# Patient Record
Sex: Female | Born: 1975
Health system: Southern US, Community
[De-identification: ages and names within clinical notes are randomized; demographics above are authoritative.]

## PROBLEM LIST (undated history)

## (undated) DIAGNOSIS — Z9889 Other specified postprocedural states: Secondary | ICD-10-CM

## (undated) DIAGNOSIS — C4490 Unspecified malignant neoplasm of skin, unspecified: Secondary | ICD-10-CM

## (undated) DIAGNOSIS — J45909 Unspecified asthma, uncomplicated: Secondary | ICD-10-CM

## (undated) DIAGNOSIS — I10 Essential (primary) hypertension: Secondary | ICD-10-CM

## (undated) DIAGNOSIS — E119 Type 2 diabetes mellitus without complications: Secondary | ICD-10-CM

## (undated) DIAGNOSIS — K219 Gastro-esophageal reflux disease without esophagitis: Secondary | ICD-10-CM

## (undated) HISTORY — PX: BASAL CELL CARCINOMA EXCISION: SHX1214

## (undated) HISTORY — DX: Gastro-esophageal reflux disease without esophagitis: K21.9

## (undated) HISTORY — DX: Other specified postprocedural states: Z98.890

## (undated) HISTORY — PX: CERVICAL DISC SURGERY: SHX588

## (undated) HISTORY — DX: Essential (primary) hypertension: I10

## (undated) HISTORY — DX: Unspecified malignant neoplasm of skin, unspecified: C44.90

---

## 2017-10-05 DIAGNOSIS — N760 Acute vaginitis: Secondary | ICD-10-CM | POA: Diagnosis not present

## 2017-10-05 DIAGNOSIS — N898 Other specified noninflammatory disorders of vagina: Secondary | ICD-10-CM | POA: Diagnosis not present

## 2017-10-05 DIAGNOSIS — B9689 Other specified bacterial agents as the cause of diseases classified elsewhere: Secondary | ICD-10-CM | POA: Diagnosis not present

## 2017-11-06 DIAGNOSIS — J209 Acute bronchitis, unspecified: Secondary | ICD-10-CM | POA: Diagnosis not present

## 2017-11-06 DIAGNOSIS — Z79899 Other long term (current) drug therapy: Secondary | ICD-10-CM | POA: Diagnosis not present

## 2017-11-06 DIAGNOSIS — J069 Acute upper respiratory infection, unspecified: Secondary | ICD-10-CM | POA: Diagnosis not present

## 2017-11-06 DIAGNOSIS — Z880 Allergy status to penicillin: Secondary | ICD-10-CM | POA: Diagnosis not present

## 2017-11-06 DIAGNOSIS — Z9049 Acquired absence of other specified parts of digestive tract: Secondary | ICD-10-CM | POA: Diagnosis not present

## 2017-11-06 DIAGNOSIS — R05 Cough: Secondary | ICD-10-CM | POA: Diagnosis not present

## 2017-11-06 DIAGNOSIS — F17210 Nicotine dependence, cigarettes, uncomplicated: Secondary | ICD-10-CM | POA: Diagnosis not present

## 2017-11-19 DIAGNOSIS — Z880 Allergy status to penicillin: Secondary | ICD-10-CM | POA: Diagnosis not present

## 2017-11-19 DIAGNOSIS — I10 Essential (primary) hypertension: Secondary | ICD-10-CM | POA: Diagnosis not present

## 2017-11-19 DIAGNOSIS — J069 Acute upper respiratory infection, unspecified: Secondary | ICD-10-CM | POA: Diagnosis not present

## 2017-11-19 DIAGNOSIS — F17210 Nicotine dependence, cigarettes, uncomplicated: Secondary | ICD-10-CM | POA: Diagnosis not present

## 2017-11-19 DIAGNOSIS — R079 Chest pain, unspecified: Secondary | ICD-10-CM | POA: Diagnosis not present

## 2017-11-19 DIAGNOSIS — J209 Acute bronchitis, unspecified: Secondary | ICD-10-CM | POA: Diagnosis not present

## 2017-11-20 DIAGNOSIS — R079 Chest pain, unspecified: Secondary | ICD-10-CM | POA: Diagnosis not present

## 2018-08-22 DIAGNOSIS — Z720 Tobacco use: Secondary | ICD-10-CM | POA: Diagnosis not present

## 2018-08-22 DIAGNOSIS — S63681A Other sprain of right thumb, initial encounter: Secondary | ICD-10-CM | POA: Diagnosis not present

## 2018-08-22 DIAGNOSIS — X58XXXA Exposure to other specified factors, initial encounter: Secondary | ICD-10-CM | POA: Diagnosis not present

## 2018-08-22 DIAGNOSIS — S6991XA Unspecified injury of right wrist, hand and finger(s), initial encounter: Secondary | ICD-10-CM | POA: Diagnosis not present

## 2018-08-22 DIAGNOSIS — M4322 Fusion of spine, cervical region: Secondary | ICD-10-CM | POA: Diagnosis not present

## 2018-08-22 DIAGNOSIS — I10 Essential (primary) hypertension: Secondary | ICD-10-CM | POA: Diagnosis not present

## 2018-08-24 DIAGNOSIS — S63501D Unspecified sprain of right wrist, subsequent encounter: Secondary | ICD-10-CM | POA: Diagnosis not present

## 2018-08-24 DIAGNOSIS — Z7689 Persons encountering health services in other specified circumstances: Secondary | ICD-10-CM | POA: Diagnosis not present

## 2018-08-24 DIAGNOSIS — I10 Essential (primary) hypertension: Secondary | ICD-10-CM | POA: Diagnosis not present

## 2018-08-24 DIAGNOSIS — M25531 Pain in right wrist: Secondary | ICD-10-CM | POA: Diagnosis not present

## 2018-08-25 DIAGNOSIS — I10 Essential (primary) hypertension: Secondary | ICD-10-CM | POA: Diagnosis not present

## 2018-09-02 DIAGNOSIS — E870 Hyperosmolality and hypernatremia: Secondary | ICD-10-CM | POA: Diagnosis not present

## 2018-09-02 DIAGNOSIS — R05 Cough: Secondary | ICD-10-CM | POA: Diagnosis not present

## 2018-09-02 DIAGNOSIS — I10 Essential (primary) hypertension: Secondary | ICD-10-CM | POA: Diagnosis not present

## 2018-09-02 DIAGNOSIS — Z0000 Encounter for general adult medical examination without abnormal findings: Secondary | ICD-10-CM | POA: Diagnosis not present

## 2018-09-02 DIAGNOSIS — R7301 Impaired fasting glucose: Secondary | ICD-10-CM | POA: Diagnosis not present

## 2018-11-02 DIAGNOSIS — H1031 Unspecified acute conjunctivitis, right eye: Secondary | ICD-10-CM | POA: Diagnosis not present

## 2018-11-02 DIAGNOSIS — R030 Elevated blood-pressure reading, without diagnosis of hypertension: Secondary | ICD-10-CM | POA: Diagnosis not present

## 2019-02-18 DIAGNOSIS — M7732 Calcaneal spur, left foot: Secondary | ICD-10-CM | POA: Diagnosis not present

## 2019-02-18 DIAGNOSIS — I10 Essential (primary) hypertension: Secondary | ICD-10-CM | POA: Diagnosis not present

## 2019-02-18 DIAGNOSIS — M19072 Primary osteoarthritis, left ankle and foot: Secondary | ICD-10-CM | POA: Diagnosis not present

## 2019-02-18 DIAGNOSIS — M79672 Pain in left foot: Secondary | ICD-10-CM | POA: Diagnosis not present

## 2019-03-03 DIAGNOSIS — M6701 Short Achilles tendon (acquired), right ankle: Secondary | ICD-10-CM | POA: Diagnosis not present

## 2019-03-03 DIAGNOSIS — M722 Plantar fascial fibromatosis: Secondary | ICD-10-CM | POA: Diagnosis not present

## 2019-03-03 DIAGNOSIS — M6702 Short Achilles tendon (acquired), left ankle: Secondary | ICD-10-CM | POA: Diagnosis not present

## 2019-03-03 DIAGNOSIS — M6240 Contracture of muscle, unspecified site: Secondary | ICD-10-CM | POA: Insufficient documentation

## 2019-03-03 DIAGNOSIS — M7522 Bicipital tendinitis, left shoulder: Secondary | ICD-10-CM | POA: Insufficient documentation

## 2019-03-17 DIAGNOSIS — Z7689 Persons encountering health services in other specified circumstances: Secondary | ICD-10-CM | POA: Diagnosis not present

## 2019-03-17 DIAGNOSIS — R7301 Impaired fasting glucose: Secondary | ICD-10-CM | POA: Diagnosis not present

## 2019-03-17 DIAGNOSIS — R51 Headache: Secondary | ICD-10-CM | POA: Diagnosis not present

## 2019-03-17 DIAGNOSIS — Z131 Encounter for screening for diabetes mellitus: Secondary | ICD-10-CM | POA: Diagnosis not present

## 2019-03-17 DIAGNOSIS — Z13220 Encounter for screening for lipoid disorders: Secondary | ICD-10-CM | POA: Diagnosis not present

## 2019-03-17 DIAGNOSIS — I10 Essential (primary) hypertension: Secondary | ICD-10-CM | POA: Diagnosis not present

## 2019-03-24 DIAGNOSIS — E1159 Type 2 diabetes mellitus with other circulatory complications: Secondary | ICD-10-CM | POA: Diagnosis not present

## 2019-03-24 DIAGNOSIS — E785 Hyperlipidemia, unspecified: Secondary | ICD-10-CM | POA: Diagnosis not present

## 2019-03-24 DIAGNOSIS — I10 Essential (primary) hypertension: Secondary | ICD-10-CM | POA: Diagnosis not present

## 2019-03-24 DIAGNOSIS — E1169 Type 2 diabetes mellitus with other specified complication: Secondary | ICD-10-CM | POA: Diagnosis not present

## 2019-04-03 DIAGNOSIS — S0219XB Other fracture of base of skull, initial encounter for open fracture: Secondary | ICD-10-CM | POA: Diagnosis not present

## 2019-04-03 DIAGNOSIS — S0512XA Contusion of eyeball and orbital tissues, left eye, initial encounter: Secondary | ICD-10-CM | POA: Diagnosis not present

## 2019-04-03 DIAGNOSIS — S00432A Contusion of left ear, initial encounter: Secondary | ICD-10-CM | POA: Diagnosis not present

## 2019-04-05 DIAGNOSIS — I10 Essential (primary) hypertension: Secondary | ICD-10-CM | POA: Diagnosis not present

## 2019-04-05 DIAGNOSIS — S0292XD Unspecified fracture of facial bones, subsequent encounter for fracture with routine healing: Secondary | ICD-10-CM | POA: Diagnosis not present

## 2019-04-05 DIAGNOSIS — Z7689 Persons encountering health services in other specified circumstances: Secondary | ICD-10-CM | POA: Diagnosis not present

## 2019-04-05 DIAGNOSIS — E1159 Type 2 diabetes mellitus with other circulatory complications: Secondary | ICD-10-CM | POA: Diagnosis not present

## 2019-04-07 DIAGNOSIS — S0219XA Other fracture of base of skull, initial encounter for closed fracture: Secondary | ICD-10-CM | POA: Diagnosis not present

## 2019-04-07 DIAGNOSIS — S058X2A Other injuries of left eye and orbit, initial encounter: Secondary | ICD-10-CM | POA: Diagnosis not present

## 2019-04-07 DIAGNOSIS — H169 Unspecified keratitis: Secondary | ICD-10-CM | POA: Diagnosis not present

## 2019-04-12 DIAGNOSIS — S0232XA Fracture of orbital floor, left side, initial encounter for closed fracture: Secondary | ICD-10-CM | POA: Diagnosis not present

## 2019-04-12 DIAGNOSIS — S02832D Fracture of medial orbital wall, left side, subsequent encounter for fracture with routine healing: Secondary | ICD-10-CM | POA: Diagnosis not present

## 2019-04-12 DIAGNOSIS — S0219XD Other fracture of base of skull, subsequent encounter for fracture with routine healing: Secondary | ICD-10-CM | POA: Diagnosis not present

## 2019-04-13 DIAGNOSIS — S0280XA Fracture of other specified skull and facial bones, unspecified side, initial encounter for closed fracture: Secondary | ICD-10-CM | POA: Diagnosis not present

## 2019-05-28 DIAGNOSIS — Z20828 Contact with and (suspected) exposure to other viral communicable diseases: Secondary | ICD-10-CM | POA: Diagnosis not present

## 2019-05-28 DIAGNOSIS — R112 Nausea with vomiting, unspecified: Secondary | ICD-10-CM | POA: Diagnosis not present

## 2019-05-28 DIAGNOSIS — R197 Diarrhea, unspecified: Secondary | ICD-10-CM | POA: Diagnosis not present

## 2019-10-06 DIAGNOSIS — E785 Hyperlipidemia, unspecified: Secondary | ICD-10-CM | POA: Diagnosis not present

## 2019-10-06 DIAGNOSIS — E1159 Type 2 diabetes mellitus with other circulatory complications: Secondary | ICD-10-CM | POA: Diagnosis not present

## 2019-10-06 DIAGNOSIS — E1169 Type 2 diabetes mellitus with other specified complication: Secondary | ICD-10-CM | POA: Diagnosis not present

## 2019-10-06 DIAGNOSIS — I152 Hypertension secondary to endocrine disorders: Secondary | ICD-10-CM | POA: Diagnosis not present

## 2019-10-07 DIAGNOSIS — E1169 Type 2 diabetes mellitus with other specified complication: Secondary | ICD-10-CM | POA: Diagnosis not present

## 2019-10-13 DIAGNOSIS — E1159 Type 2 diabetes mellitus with other circulatory complications: Secondary | ICD-10-CM | POA: Diagnosis not present

## 2019-10-13 DIAGNOSIS — I152 Hypertension secondary to endocrine disorders: Secondary | ICD-10-CM | POA: Diagnosis not present

## 2019-10-13 DIAGNOSIS — E785 Hyperlipidemia, unspecified: Secondary | ICD-10-CM | POA: Diagnosis not present

## 2019-10-13 DIAGNOSIS — E1169 Type 2 diabetes mellitus with other specified complication: Secondary | ICD-10-CM | POA: Diagnosis not present

## 2019-10-27 DIAGNOSIS — I152 Hypertension secondary to endocrine disorders: Secondary | ICD-10-CM | POA: Diagnosis not present

## 2019-10-27 DIAGNOSIS — E1159 Type 2 diabetes mellitus with other circulatory complications: Secondary | ICD-10-CM | POA: Diagnosis not present

## 2019-10-27 DIAGNOSIS — F17200 Nicotine dependence, unspecified, uncomplicated: Secondary | ICD-10-CM | POA: Diagnosis not present

## 2019-10-27 DIAGNOSIS — Z6839 Body mass index (BMI) 39.0-39.9, adult: Secondary | ICD-10-CM | POA: Diagnosis not present

## 2019-11-08 DIAGNOSIS — Z1231 Encounter for screening mammogram for malignant neoplasm of breast: Secondary | ICD-10-CM | POA: Diagnosis not present

## 2019-11-10 DIAGNOSIS — F17200 Nicotine dependence, unspecified, uncomplicated: Secondary | ICD-10-CM | POA: Diagnosis not present

## 2019-11-10 DIAGNOSIS — F320 Major depressive disorder, single episode, mild: Secondary | ICD-10-CM | POA: Diagnosis not present

## 2019-11-10 DIAGNOSIS — Z6839 Body mass index (BMI) 39.0-39.9, adult: Secondary | ICD-10-CM | POA: Diagnosis not present

## 2019-11-22 DIAGNOSIS — Z0000 Encounter for general adult medical examination without abnormal findings: Secondary | ICD-10-CM | POA: Diagnosis not present

## 2019-11-22 DIAGNOSIS — E1159 Type 2 diabetes mellitus with other circulatory complications: Secondary | ICD-10-CM | POA: Diagnosis not present

## 2019-11-22 DIAGNOSIS — N760 Acute vaginitis: Secondary | ICD-10-CM | POA: Diagnosis not present

## 2019-11-22 DIAGNOSIS — B9689 Other specified bacterial agents as the cause of diseases classified elsewhere: Secondary | ICD-10-CM | POA: Diagnosis not present

## 2019-11-22 DIAGNOSIS — Z01419 Encounter for gynecological examination (general) (routine) without abnormal findings: Secondary | ICD-10-CM | POA: Diagnosis not present

## 2019-11-22 DIAGNOSIS — I152 Hypertension secondary to endocrine disorders: Secondary | ICD-10-CM | POA: Diagnosis not present

## 2020-01-24 DIAGNOSIS — E1169 Type 2 diabetes mellitus with other specified complication: Secondary | ICD-10-CM | POA: Diagnosis not present

## 2020-06-21 DIAGNOSIS — J45901 Unspecified asthma with (acute) exacerbation: Secondary | ICD-10-CM | POA: Diagnosis not present

## 2020-06-21 DIAGNOSIS — Z20822 Contact with and (suspected) exposure to covid-19: Secondary | ICD-10-CM | POA: Diagnosis not present

## 2020-06-21 DIAGNOSIS — R079 Chest pain, unspecified: Secondary | ICD-10-CM | POA: Diagnosis not present

## 2020-06-21 DIAGNOSIS — Z91041 Radiographic dye allergy status: Secondary | ICD-10-CM | POA: Diagnosis not present

## 2020-06-21 DIAGNOSIS — Z880 Allergy status to penicillin: Secondary | ICD-10-CM | POA: Diagnosis not present

## 2020-06-21 DIAGNOSIS — Z79899 Other long term (current) drug therapy: Secondary | ICD-10-CM | POA: Diagnosis not present

## 2020-06-21 DIAGNOSIS — F17210 Nicotine dependence, cigarettes, uncomplicated: Secondary | ICD-10-CM | POA: Diagnosis not present

## 2020-06-21 DIAGNOSIS — R059 Cough, unspecified: Secondary | ICD-10-CM | POA: Diagnosis not present

## 2020-06-21 DIAGNOSIS — Z888 Allergy status to other drugs, medicaments and biological substances status: Secondary | ICD-10-CM | POA: Diagnosis not present

## 2020-06-21 DIAGNOSIS — I517 Cardiomegaly: Secondary | ICD-10-CM | POA: Diagnosis not present

## 2020-06-21 DIAGNOSIS — R0602 Shortness of breath: Secondary | ICD-10-CM | POA: Diagnosis not present

## 2020-06-21 DIAGNOSIS — I10 Essential (primary) hypertension: Secondary | ICD-10-CM | POA: Diagnosis not present

## 2020-06-21 DIAGNOSIS — E669 Obesity, unspecified: Secondary | ICD-10-CM | POA: Diagnosis not present

## 2020-07-26 ENCOUNTER — Ambulatory Visit: Payer: Self-pay

## 2020-07-26 ENCOUNTER — Telehealth: Payer: Self-pay | Admitting: Emergency Medicine

## 2020-07-26 DIAGNOSIS — K921 Melena: Secondary | ICD-10-CM | POA: Diagnosis not present

## 2020-07-26 DIAGNOSIS — K5730 Diverticulosis of large intestine without perforation or abscess without bleeding: Secondary | ICD-10-CM | POA: Diagnosis not present

## 2020-07-26 DIAGNOSIS — R1084 Generalized abdominal pain: Secondary | ICD-10-CM | POA: Diagnosis not present

## 2020-07-26 DIAGNOSIS — R197 Diarrhea, unspecified: Secondary | ICD-10-CM | POA: Diagnosis not present

## 2020-07-26 NOTE — Telephone Encounter (Signed)
Call to pt regarding symptoms for appointment later on today - pt directed to ER for a higher level of testing and labs - provider updated. Pt verbalized an understanding and confirmed she would go to the ER ASAP

## 2020-08-28 ENCOUNTER — Other Ambulatory Visit: Payer: Self-pay | Admitting: General Surgery

## 2020-08-29 ENCOUNTER — Ambulatory Visit: Payer: Self-pay | Admitting: Gastroenterology

## 2020-09-07 ENCOUNTER — Encounter: Payer: Self-pay | Admitting: Nurse Practitioner

## 2020-09-25 ENCOUNTER — Other Ambulatory Visit: Payer: Self-pay

## 2020-09-27 ENCOUNTER — Telehealth: Payer: Self-pay

## 2020-09-27 ENCOUNTER — Ambulatory Visit: Payer: 59 | Admitting: Nurse Practitioner

## 2020-09-27 ENCOUNTER — Encounter: Payer: Self-pay | Admitting: Nurse Practitioner

## 2020-09-27 ENCOUNTER — Other Ambulatory Visit: Payer: Self-pay

## 2020-09-27 VITALS — BP 176/100 | HR 92 | Ht 69.0 in | Wt 262.6 lb

## 2020-09-27 DIAGNOSIS — K625 Hemorrhage of anus and rectum: Secondary | ICD-10-CM | POA: Diagnosis not present

## 2020-09-27 DIAGNOSIS — I10 Essential (primary) hypertension: Secondary | ICD-10-CM

## 2020-09-27 DIAGNOSIS — K219 Gastro-esophageal reflux disease without esophagitis: Secondary | ICD-10-CM

## 2020-09-27 MED ORDER — PANTOPRAZOLE SODIUM 40 MG PO TBEC: 40.0000 mg | DELAYED_RELEASE_TABLET | Freq: Every day | ORAL | 1 refills | Status: DC

## 2020-09-27 MED ORDER — SUPREP BOWEL PREP KIT 17.5-3.13-1.6 GM/177ML PO SOLN: 1.0000 | ORAL | 0 refills | Status: DC

## 2020-09-27 NOTE — Patient Instructions (Addendum)
If you are age 45 or younger, your body mass index should be between 19-25. Your Body mass index is 38.78 kg/m. If this is out of the aformentioned range listed, please consider follow up with your Primary Care Provider.  PROCEDURES:  You have been scheduled for an endoscopy and colonoscopy. Please follow the written instructions given to you at your visit today. Please pick up your prep supplies at the pharmacy within the next 1-3 days. If you use inhalers (even only as needed), please bring them with you on the day of your procedure.  Schedule appointment with primary care to get your blood pressure medications. Your blood pressure will need to be under control before you can have the procedures.  MEDICATION  We have sent the following medication to your pharmacy for you to pick up at your convenience:  Pantoprazole 40 MG once a day.  It was great seeing you today!  Thank you for entrusting me with your care and choosing [facility].  Noralyn Pick, CRNP   Gastroesophageal Reflux Disease, Adult  Gastroesophageal reflux (GER) happens when acid from the stomach flows up into the tube that connects the mouth and the stomach (esophagus). Normally, food travels down the esophagus and stays in the stomach to be digested. With GER, food and stomach acid sometimes move back up into the esophagus. You may have a disease called gastroesophageal reflux disease (GERD) if the reflux:  Happens often.  Causes frequent or very bad symptoms.  Causes problems such as damage to the esophagus. When this happens, the esophagus becomes sore and swollen. Over time, GERD can make small holes (ulcers) in the lining of the esophagus. What are the causes? This condition is caused by a problem with the muscle between the esophagus and the stomach. When this muscle is weak or not normal, it does not close properly to keep food and acid from coming back up from the stomach. The muscle can be  weak because of:  Tobacco use.  Pregnancy.  Having a certain type of hernia (hiatal hernia).  Alcohol use.  Certain foods and drinks, such as coffee, chocolate, onions, and peppermint. What increases the risk?  Being overweight.  Having a disease that affects your connective tissue.  Taking NSAIDs, such a ibuprofen. What are the signs or symptoms?  Heartburn.  Difficult or painful swallowing.  The feeling of having a lump in the throat.  A bitter taste in the mouth.  Bad breath.  Having a lot of saliva.  Having an upset or bloated stomach.  Burping.  Chest pain. Different conditions can cause chest pain. Make sure you see your doctor if you have chest pain.  Shortness of breath or wheezing.  A long-term cough or a cough at night.  Wearing away of the surface of teeth (tooth enamel).  Weight loss. How is this treated?  Making changes to your diet.  Taking medicine.  Having surgery. Treatment will depend on how bad your symptoms are. Follow these instructions at home: Eating and drinking  Follow a diet as told by your doctor. You may need to avoid foods and drinks such as: ? Coffee and tea, with or without caffeine. ? Drinks that contain alcohol. ? Energy drinks and sports drinks. ? Bubbly (carbonated) drinks or sodas. ? Chocolate and cocoa. ? Peppermint and mint flavorings. ? Garlic and onions. ? Horseradish. ? Spicy and acidic foods. These include peppers, chili powder, curry powder, vinegar, hot sauces, and BBQ sauce. ? Citrus  fruit juices and citrus fruits, such as oranges, lemons, and limes. ? Tomato-based foods. These include red sauce, chili, salsa, and pizza with red sauce. ? Fried and fatty foods. These include donuts, french fries, potato chips, and high-fat dressings. ? High-fat meats. These include hot dogs, rib eye steak, sausage, ham, and bacon. ? High-fat dairy items, such as whole milk, butter, and cream cheese.  Eat small meals  often. Avoid eating large meals.  Avoid drinking large amounts of liquid with your meals.  Avoid eating meals during the 2-3 hours before bedtime.  Avoid lying down right after you eat.  Do not exercise right after you eat.   Lifestyle  Do not smoke or use any products that contain nicotine or tobacco. If you need help quitting, ask your doctor.  Try to lower your stress. If you need help doing this, ask your doctor.  If you are overweight, lose an amount of weight that is healthy for you. Ask your doctor about a safe weight loss goal.   General instructions  Pay attention to any changes in your symptoms.  Take over-the-counter and prescription medicines only as told by your doctor.  Do not take aspirin, ibuprofen, or other NSAIDs unless your doctor says it is okay.  Wear loose clothes. Do not wear anything tight around your waist.  Raise (elevate) the head of your bed about 6 inches (15 cm). You may need to use a wedge to do this.  Avoid bending over if this makes your symptoms worse.  Keep all follow-up visits. Contact a doctor if:  You have new symptoms.  You lose weight and you do not know why.  You have trouble swallowing or it hurts to swallow.  You have wheezing or a cough that keeps happening.  You have a hoarse voice.  Your symptoms do not get better with treatment. Get help right away if:  You have sudden pain in your arms, neck, jaw, teeth, or back.  You suddenly feel sweaty, dizzy, or light-headed.  You have chest pain or shortness of breath.  You vomit and the vomit is green, yellow, or black, or it looks like blood or coffee grounds.  You faint.  Your poop (stool) is red, bloody, or black.  You cannot swallow, drink, or eat. These symptoms may represent a serious problem that is an emergency. Do not wait to see if the symptoms will go away. Get medical help right away. Call your local emergency services (911 in the [place] Do not drive yourself  to the hospital. Summary  If a person has gastroesophageal reflux disease (GERD), food and stomach acid move back up into the esophagus and cause symptoms or problems such as damage to the esophagus.  Treatment will depend on how bad your symptoms are.  Follow a diet as told by your doctor.  Take all medicines only as told by your doctor. This information is not intended to replace advice given to you by your health care provider. Make sure you discuss any questions you have with your health care provider. Document Revised: 02/06/2020 Document Reviewed: 02/06/2020 Elsevier Patient Education  [place].

## 2020-09-27 NOTE — Telephone Encounter (Signed)
-----   Message from Irving Copas., MD sent at 09/27/2020  4:51 PM EST -----   ----- Message ----- From: Noralyn Pick, NP Sent: 09/27/2020   4:47 PM EST To: Irving Copas., MD

## 2020-09-27 NOTE — Telephone Encounter (Signed)
I have reviewed the chart.   I agree with the Advanced Practitioner's note, impression, and recommendations with any updates as below. Reasonable next steps for evaluation include endoscopic evaluation.  Would like to ensure that her blood pressure is stable to improved before her procedure so she does not prep.  When we have her come in for a previsit to have her blood pressure checked once again and ensure that things are better.  If things are not better then she may need to be rescheduled.  Lamari Beckles, can you work on getting her set up for a previsit 1 week before her procedure so that we can make sure she is doing better, has a PCP in place, and her blood pressure is stable.   Justice Britain, MD [facility] Advanced [facility]oscopy Office # [phone]

## 2020-09-27 NOTE — Progress Notes (Signed)
Attending Physician's Attestation   I have reviewed the chart.   I agree with the Advanced Practitioner's note, impression, and recommendations with any updates as below. Reasonable next steps for evaluation include endoscopic evaluation.  Would like to ensure that her blood pressure is stable to improved before her procedure so she does not prep.  When we have her come in for a previsit to have her blood pressure checked once again and ensure that things are better.  If things are not better then she may need to be rescheduled.  Patty, can you work on getting her set up for a previsit 1 week before her procedure so that we can make sure she is doing better, has a PCP in place, and her blood pressure is stable.   Justice Britain, MD [facility] Advanced [facility] Office # [phone]

## 2020-09-27 NOTE — Progress Notes (Addendum)
09/27/2020 Sheri [name] [id number] [birth year]   CHIEF COMPLAINT: Rectal bleeding  HISTORY OF PRESENT ILLNESS:  Sheri [name]. Dicaprio is a 45 year old female with a past medical history asthma, hypertension and basal cell carcinoma.  Past Cervical surgery.  She presents to our office today for further evaluation regarding rectal bleeding.  She reported passing a normal brown bowel movement without straining with a moderate amount of bright red blood which was seen on the toilet tissue and in the toilet water with rectal discomfort which occurred for three consecutive days.  She had right sided mid abdominal/flank pain. She presented to [facility] in [place] on 07/26/2020 for further evaluation.  Labs in the ED showed a WBC 11.5.  Hemoglobin 14.1.  Hematocrit 42.6.  Platelet 260.  BUN 12.  Creatinine 0.78.  Glucose 262.  Total bili 0.5.  Alk phos 86.  AST 22.  ALT 31.  FOBT was negative.  A rectal exam done by the ED physician was normal, no evidence of fissure, hemorrhoids or mass.  An abdominal/pelvic CT scan without contrast was unrevealing.  Her blood pressure was elevated and she did not have a PCP therefore she was prescribed amlodipine 10 mg daily and lisinopril 10 mg daily.  She was prescribed meloxicam 15 mg one p.o. daily for lower abdominal pain, she does not recall if she took this medication or not.  She reported having similar episodes of rectal bleeding which occurred for two or 3 days in October and November 2021.  She denies ever having a colonoscopy.  Paternal grandfather with history of colon cancer.  Mother with history of colon polyps.  She has a history of GERD.  She complains of having heartburn mostly at nighttime which comes and goes for several years.  She purchased a wedge pillow and is now sleeping with the head of the bed elevated since 07/2020 and her reflux symptoms have somewhat improved.  No dysphagia.  No nausea or vomiting.  She underwent an EGD  at [facility] approximately 5 years ago which showed GERD.  He is not taking any acid reducing medications.  She takes Ibuprofen 200 mg two tabs 2-3 nights weekly for leg pain.  Caffeine intake is limited.  No drugs or alcohol use.  She smokes less than 1 pack of cigarettes daily.  CTAP without contrast 07/26/2020: Lower chest: Lung bases are clear. Hepatobiliary: No focal hepatic lesion on noncontrast exam. Normal gallbladder. Pancreas: Pancreas is normal. No ductal dilatation. No pancreatic inflammation. Spleen: Normal spleen Adrenals/urinary tract: Adrenal glands and kidneys are normal. The ureters and bladder normal. Stomach/Bowel: Stomach, duodenum small-bowel normal. No evidence of bowel obstruction or mass. No evidence of bowel inflammation terminal ileum is normal. Appendix normal. The ascending transverse, and descending colon normal. No obstruction or mass. Minimal diverticulosis. No diverticulitis. Rectum normal. Vascular/Lymphatic: Abdominal aorta is normal caliber. No periportal or retroperitoneal adenopathy. No pelvic adenopathy. Reproductive: Uterus and adnexa unremarkable. Other: No free fluid. Musculoskeletal: No aggressive osseous lesion. Impression: 1. No explanation for gastrointestinal bleeding. 2. No small bowel or large bowel inflammation or obstruction. No diverticulitis.  Past Medical History:  Diagnosis Date  . GERD (gastroesophageal reflux disease)   . H/O neck surgery    ED records from 07/26/2020  . Hypertension   . Skin cancer    Past Surgical History:  Procedure Laterality Date  . BASAL CELL CARCINOMA EXCISION     ed records 07/26/2020  . CERVICAL DISC SURGERY  Family History: Mother age 41 with diabetes and HTN. Father age 79 with diabetes. Maternal grandfather colon cancer. Great grandmother had colon cancer. 2 sisters 5 1/2 siblings.   Social History: She is single.  She is a Charity fundraiser at [employer].  She smokes < 7 to 8 cigarettes since  age 32. No alcohol. No drug use.    Allergies  Allergen Reactions  . Iodine Anaphylaxis and Swelling  . Penicillins Hives, Itching and Rash  . Erythromycin Swelling  . Metformin Diarrhea     Outpatient Encounter Medications as of 09/27/2020  Medication Sig  . amLODipine (NORVASC) 10 MG tablet Take by mouth. (Patient not taking: Reported on 09/27/2020)  . hydrochlorothiazide (HYDRODIURIL) 25 MG tablet TAKE ONE-HALF TABLET BY MOUTH ONE TIME DAILY (Patient not taking: Reported on 09/27/2020)  . lisinopril (ZESTRIL) 10 MG tablet Take by mouth. (Patient not taking: Reported on 09/27/2020)  . meloxicam (MOBIC) 15 MG tablet Take by mouth. (Patient not taking: Reported on 09/27/2020)  . [DISCONTINUED] lisinopril (ZESTRIL) 20 MG tablet    No facility-administered encounter medications on file as of 09/27/2020.   REVIEW OF SYSTEMS:  Gen: Denies fever, sweats or chills. No weight loss.  CV: Denies chest pain, palpitations or edema. Resp: Denies cough, shortness of breath of hemoptysis.  GI: See HPI.   GU : Denies urinary burning, blood in urine, increased urinary frequency or incontinence. MS: Bilateral leg pain. Derm: Denies rash, itchiness, skin lesions or unhealing ulcers. Psych: Denies depression, anxiety, memory loss, suicidal ideation and confusion. Heme: Denies bruising, bleeding. Neuro:  Denies headaches, dizziness or paresthesias. Endo:  Denies any problems with DM, thyroid or adrenal function.  PHYSICAL EXAM: BP (!) 176/100   Pulse 92   Ht _0  (1.753 m)   Wt 262 lb 9.6 oz (119.1 kg)   BMI 38.78 kg/m  General: Obese 45 year old female in no acute distress. Head: Normocephalic and atraumatic. Eyes:  Sclerae non-icteric, conjunctive pink. Ears: Normal auditory acuity. Mouth: Dentition intact. No ulcers or lesions.  Neck: Supple, no lymphadenopathy or thyromegaly.  Lungs: Clear bilaterally to auscultation without wheezes, crackles or rhonchi. Heart: Regular rate and rhythm. No  murmur, rub or gallop appreciated.  Abdomen: Soft, nontender, non distended. No masses. No hepatosplenomegaly. Normoactive bowel sounds x 4 quadrants.  Rectal: Patient declined exam.  She wishes to proceed with a colonoscopy for further evaluation. Musculoskeletal: Symmetrical with no gross deformities. Skin: Warm and dry. No rash or lesions on visible extremities. Extremities: No edema. Neurological: Alert oriented x 4, no focal deficits.  Psychological:  Alert and cooperative. Normal mood and affect.  ASSESSMENT AND PLAN:  49.  45 year old female with episode rectal pain and associated hematochezia.  -Colonoscopy benefits and risks discussed including risk with sedation, risk of bleeding, perforation and infection  -Patient to call our office if rectal bleeding recurs  2.  GERD -GERD handout -Pantoprazole 40 mg one p.o. daily -EGD benefits and risks discussed including risk with sedation, risk of bleeding, perforation and infection   3. Hypertension. BP 176/100.  She was previously prescribed Norvasc and Lisinopril by the ED but she has run out of these medications.  -Patient advised to schedule an appointment with new PCP or at the urgent care for hypertension management ASAP, at risk for CVA/MI.  -Patient is informed if her blood pressure is not well controlled by the date of her scheduled EGD and colonoscopy these procedures will be canceled.      ADDENDUM: BP RECHECK 142/86 ON 10/31/2020.  PATIENT TO PROCEED WITH EGD/COLONOSCOPY 11/07/2020 AS SCHEDULED.        CC:  No ref. provider found

## 2020-09-28 NOTE — Telephone Encounter (Signed)
appt for nurse visit on 10/31/20 at 10 am.  The patient has been notified of this information and all questions answered.

## 2020-09-28 NOTE — Telephone Encounter (Signed)
Do you want a nurse visit for a BP check?  Or an actual previsit with the previsit nurse.  I do not believe the previsit nurses do not check BP.

## 2020-09-28 NOTE — Telephone Encounter (Signed)
BP check is fine to ensure ability to move forward. Thanks. GM

## 2020-10-01 ENCOUNTER — Other Ambulatory Visit: Payer: Self-pay

## 2020-10-01 ENCOUNTER — Encounter (INDEPENDENT_AMBULATORY_CARE_PROVIDER_SITE_OTHER): Payer: Self-pay

## 2020-10-01 ENCOUNTER — Ambulatory Visit: Payer: 59 | Admitting: Family

## 2020-10-01 ENCOUNTER — Encounter: Payer: Self-pay | Admitting: Family

## 2020-10-01 VITALS — BP 163/97 | HR 96 | Ht 68.9 in | Wt 260.6 lb

## 2020-10-01 DIAGNOSIS — I10 Essential (primary) hypertension: Secondary | ICD-10-CM | POA: Diagnosis not present

## 2020-10-01 DIAGNOSIS — Z7689 Persons encountering health services in other specified circumstances: Secondary | ICD-10-CM

## 2020-10-01 MED ORDER — LISINOPRIL 30 MG PO TABS: 30.0000 mg | ORAL_TABLET | Freq: Every day | ORAL | 0 refills | Status: DC

## 2020-10-01 MED ORDER — AMLODIPINE BESYLATE 10 MG PO TABS: 10.0000 mg | ORAL_TABLET | Freq: Every day | ORAL | 0 refills | Status: DC

## 2020-10-01 NOTE — Patient Instructions (Addendum)
Return for annual physical examination, labs, and health maintenance. Arrive fasting meaning having had no food and/or nothing to drink for at least 8 hours prior to appointment.  Lisinopril and Amlodipine for high blood pressure. Bring blood pressure monitor to blood pressure check in 2 weeks. Blood pressure goal 130/80 or less.  Thank you for choosing Primary Care at [facility] for your medical home!    Sheri [name] was seen by Camillia Herter, NP today.   Sheri [name]'s primary care provider is Roselynne Lortz Zachery Dauer, NP.   For the best care possible,  you should try to see Durene Fruits, NP whenever you come to clinic.   We look forward to seeing you again soon!  If you have any questions about your visit today,  please call us at [phone]  Or feel free to reach your provider via [place].     Hypertension, Adult Hypertension is another name for high blood pressure. High blood pressure forces your heart to work harder to pump blood. This can cause problems over time. There are two numbers in a blood pressure reading. There is a top number (systolic) over a bottom number (diastolic). It is best to have a blood pressure that is below 120/80. Healthy choices can help lower your blood pressure, or you may need medicine to help lower it. What are the causes? The cause of this condition is not known. Some conditions may be related to high blood pressure. What increases the risk?  Smoking.  Having type 2 diabetes mellitus, high cholesterol, or both.  Not getting enough exercise or physical activity.  Being overweight.  Having too much fat, sugar, calories, or salt (sodium) in your diet.  Drinking too much alcohol.  Having long-term (chronic) kidney disease.  Having a family history of high blood pressure.  Age. Risk increases with age.  Race. You may be at higher risk if you are African American.  Gender. Men are at higher risk than women before age 68. After age 29,  women are at higher risk than men.  Having obstructive sleep apnea.  Stress. What are the signs or symptoms?  High blood pressure may not cause symptoms. Very high blood pressure (hypertensive crisis) may cause: ? Headache. ? Feelings of worry or nervousness (anxiety). ? Shortness of breath. ? Nosebleed. ? A feeling of being sick to your stomach (nausea). ? Throwing up (vomiting). ? Changes in how you see. ? Very bad chest pain. ? Seizures. How is this treated?  This condition is treated by making healthy lifestyle changes, such as: ? Eating healthy foods. ? Exercising more. ? Drinking less alcohol.  Your health care provider may prescribe medicine if lifestyle changes are not enough to get your blood pressure under control, and if: ? Your top number is above 130. ? Your bottom number is above 80.  Your personal target blood pressure may vary. Follow these instructions at home: Eating and drinking  If told, follow the DASH eating plan. To follow this plan: ? Fill one half of your plate at each meal with fruits and vegetables. ? Fill one fourth of your plate at each meal with whole grains. Whole grains include whole-wheat pasta, brown rice, and whole-grain bread. ? Eat or drink low-fat dairy products, such as skim milk or low-fat yogurt. ? Fill one fourth of your plate at each meal with low-fat (lean) proteins. Low-fat proteins include fish, chicken without skin, eggs, beans, and tofu. ? Avoid fatty meat, cured  and processed meat, or chicken with skin. ? Avoid pre-made or processed food.  Eat less than 1,500 mg of salt each day.  Do not drink alcohol if: ? Your doctor tells you not to drink. ? You are pregnant, may be pregnant, or are planning to become pregnant.  If you drink alcohol: ? Limit how much you use to:  0-1 drink a day for women.  0-2 drinks a day for men. ? Be aware of how much alcohol is in your drink. In the [place] one drink equals one 12 oz bottle of  beer (355 mL), one 5 oz glass of wine (148 mL), or one 1 oz glass of hard liquor (44 mL).   Lifestyle  Work with your doctor to stay at a healthy weight or to lose weight. Ask your doctor what the best weight is for you.  Get at least 30 minutes of exercise most days of the week. This may include walking, swimming, or biking.  Get at least 30 minutes of exercise that strengthens your muscles (resistance exercise) at least 3 days a week. This may include lifting weights or doing Pilates.  Do not use any products that contain nicotine or tobacco, such as cigarettes, e-cigarettes, and chewing tobacco. If you need help quitting, ask your doctor.  Check your blood pressure at home as told by your doctor.  Keep all follow-up visits as told by your doctor. This is important.   Medicines  Take over-the-counter and prescription medicines only as told by your doctor. Follow directions carefully.  Do not skip doses of blood pressure medicine. The medicine does not work as well if you skip doses. Skipping doses also puts you at risk for problems.  Ask your doctor about side effects or reactions to medicines that you should watch for. Contact a doctor if you:  Think you are having a reaction to the medicine you are taking.  Have headaches that keep coming back (recurring).  Feel dizzy.  Have swelling in your ankles.  Have trouble with your vision. Get help right away if you:  Get a very bad headache.  Start to feel mixed up (confused).  Feel weak or numb.  Feel faint.  Have very bad pain in your: ? Chest. ? Belly (abdomen).  Throw up more than once.  Have trouble breathing. Summary  Hypertension is another name for high blood pressure.  High blood pressure forces your heart to work harder to pump blood.  For most people, a normal blood pressure is less than 120/80.  Making healthy choices can help lower blood pressure. If your blood pressure does not get lower with  healthy choices, you may need to take medicine. This information is not intended to replace advice given to you by your health care provider. Make sure you discuss any questions you have with your health care provider. Document Revised: 04/07/2018 Document Reviewed: 04/07/2018 Elsevier Patient Education  2021 [employer].

## 2020-10-01 NOTE — Progress Notes (Signed)
Establish care Throbbing headaches Frequent urgent need to urinate for about 6 months Needs BP meds refill-only stated taking lisinopril did not take amlodipine or hydrochlorothiazide

## 2020-10-01 NOTE — Progress Notes (Signed)
Subjective:    Sheri [name] - 45 y.o. female MRN [id number]  Date of birth: 08-11-1976  HPI  Sheri [name] is to establish care. Patient has a PMH significant for hypertension and rectal bleeding.    Current issues and/or concerns: 1. HYPERTENSION: Visit 06/21/2020 at [facility] Emergency Department per NP note: Hydrochlorothiazide added for hypertension management.   Visit 09/27/2020 at Gastroenterology per NP note: Hypertension. BP 176/100.  She was previously prescribed Norvasc and Lisinopril by the ED but she has run out of these medications.  -Patient advised to schedule an appointment with new PCP or at the urgent care for hypertension management ASAP, at risk for CVA/MI.  -Patient is informed if her blood pressure is not well controlled by the date of her scheduled EGD and colonoscopy these procedures will be canceled.    10/01/2020: Reports initially taking Lisinopril 30 mg daily prescribed from previous primary provider and ran out 12 months ago. Subsequently Amlodipine 10 mg was added to the regimen from primary care and ran out about 12 months ago as well. Then had a visit at the Emergency Department in November 2021 at which time Hydrochlorothiazide was added, says she is no longer taking this because she ran outl. Would like to get blood pressure controlled so that she will able to have her colonoscopy soon.   Med Adherence: []  Yes    [x]  No Medication side effects: []  Yes    [x]  No Adherence with salt restriction (low-salt diet): [x]  Yes    []  No Exercise: Yes []  No [x]   Home Monitoring?: [x]  Yes    []  No Monitoring Frequency: [x]  Yes    []  No Home BP results range: []  Yes    [x]  No, has blood pressure monitor at home Smoking [x]  Yes, 6 cigarettes daily, does cough a lot and using inhaler as needed   SOB? []  Yes    [x]  No Chest Pain?: []  Yes    [x]  No Leg swelling?: [x]  Yes, plantar fasciitis Headaches?: [x]  Yes    []  No Dizziness? []  Yes    [x]  No   ROS  per HPI   Health Maintenance:  Health Maintenance Due  Topic Date Due  . Hepatitis C Screening  Never done  . COVID-19 Vaccine (1) Never done  . HIV Screening  Never done  . TETANUS/TDAP  Never done  . PAP SMEAR-Modifier  Never done    Past Medical History: Patient Active Problem List   Diagnosis Date Noted  . Rectal bleeding 09/27/2020  . Hypertension 09/27/2020  . 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 Plantar fasciitis 03/03/2019    Social History   reports that she has been smoking. She has never used smokeless tobacco. She reports previous alcohol use. She reports previous drug use.   Family History  family history includes Colon cancer in her maternal grandfather; Diabetes in her father, mother, and paternal grandmother; Hypertension in her father and mother.   Medications: reviewed and updated   Objective:   Physical Exam BP (!) 163/97 (BP Location: Left Arm)   Pulse 96   Ht 5' 8.9" (1.75 m)   Wt 260 lb 9.6 oz (118.2 kg)   SpO2 97%   BMI 38.60 kg/m  Physical Exam Constitutional:      Appearance: She is obese.  HENT:     Head: Normocephalic.  Eyes:     Extraocular Movements: Extraocular movements intact.     Pupils: Pupils are equal, round, and reactive to  light.  Cardiovascular:     Rate and Rhythm: Normal rate and regular rhythm.     Pulses: Normal pulses.     Heart sounds: Normal heart sounds.  Pulmonary:     Effort: Pulmonary effort is normal.     Breath sounds: Normal breath sounds.  Neurological:     General: No focal deficit present.     Mental Status: She is alert and oriented to person, place, and time.  Psychiatric:        Mood and Affect: Mood normal.        Behavior: Behavior normal.        Assessment & Plan:  1. Encounter to establish care: - Patient presents today to establish care.  - Return for annual physical examination, labs, and health maintenance. Arrive fasting meaning having had no food and/or nothing to drink for at  least 8 hours prior to appointment.  2. Essential hypertension: - Uncontrolled.  - Begin taking Lisinopril and Amlodipine as prescribed.  - Follow-up within 2 weeks for blood pressure check. Write down your blood pressure readings each day and bring those results along with your home blood pressure monitor to your appointment.  - Counseled on blood pressure goal of less than 130/80, low-sodium, DASH diet, medication compliance, 150 minutes of moderate intensity exercise per week as tolerated. Discussed medication compliance, adverse effects. - BMP to check kidney function and electrolyte balance.  - Basic Metabolic Panel - lisinopril (ZESTRIL) 30 MG tablet; Take 1 tablet (30 mg total) by mouth daily.  Dispense: 30 tablet; Refill: 0 - amLODipine (NORVASC) 10 MG tablet; Take 1 tablet (10 mg total) by mouth daily.  Dispense: 30 tablet; Refill: 0   Patient was given clear instructions to go to Emergency Department or return to medical center if symptoms don't improve, worsen, or new problems develop.The patient verbalized understanding.  I discussed the assessment and treatment plan with the patient. The patient was provided an opportunity to ask questions and all were answered. The patient agreed with the plan and demonstrated an understanding of the instructions.   The patient was advised to call back or seek an in-person evaluation if the symptoms worsen or if the condition fails to improve as anticipated.    Durene Fruits, NP 10/01/2020, 8:59 PM Primary Care at [facility]

## 2020-10-02 ENCOUNTER — Other Ambulatory Visit: Payer: Self-pay

## 2020-10-02 DIAGNOSIS — I10 Essential (primary) hypertension: Secondary | ICD-10-CM

## 2020-10-02 LAB — BASIC METABOLIC PANEL
BUN/Creatinine Ratio: 15 (ref 9–23)
BUN: 12 mg/dL (ref 6–24)
CO2: 23 mmol/L (ref 20–29)
Calcium: 9.7 mg/dL (ref 8.7–10.2)
Chloride: 101 mmol/L (ref 96–106)
Creatinine, Ser: 0.78 mg/dL (ref 0.57–1.00)
GFR calc Af Amer: 107 mL/min/{1.73_m2} (ref >59)
GFR calc non Af Amer: 93 mL/min/{1.73_m2} (ref >59)
Glucose: 107 mg/dL — ABNORMAL HIGH (ref 65–99)
Potassium: 4.2 mmol/L (ref 3.5–5.2)
Sodium: 140 mmol/L (ref 134–144)

## 2020-10-02 MED ORDER — AMLODIPINE BESYLATE 10 MG PO TABS: 10.0000 mg | ORAL_TABLET | Freq: Every day | ORAL | 0 refills | Status: DC

## 2020-10-02 MED ORDER — LISINOPRIL 30 MG PO TABS: 30.0000 mg | ORAL_TABLET | Freq: Every day | ORAL | 0 refills | Status: DC

## 2020-10-02 NOTE — Progress Notes (Signed)
Amlodipine and lisinopril reordered for pt

## 2020-10-02 NOTE — Progress Notes (Signed)
Please call patient with update.   Kidney function normal.

## 2020-10-04 ENCOUNTER — Ambulatory Visit: Payer: 59 | Admitting: Medical-Surgical

## 2020-10-05 ENCOUNTER — Ambulatory Visit: Payer: 59 | Admitting: Medical-Surgical

## 2020-10-17 ENCOUNTER — Other Ambulatory Visit: Payer: Self-pay

## 2020-10-17 ENCOUNTER — Ambulatory Visit (INDEPENDENT_AMBULATORY_CARE_PROVIDER_SITE_OTHER): Payer: 59 | Admitting: Family

## 2020-10-17 ENCOUNTER — Encounter: Payer: Self-pay | Admitting: Family

## 2020-10-17 VITALS — BP 139/77 | HR 97 | Ht 68.9 in | Wt 257.0 lb

## 2020-10-17 DIAGNOSIS — R0600 Dyspnea, unspecified: Secondary | ICD-10-CM

## 2020-10-17 DIAGNOSIS — G4700 Insomnia, unspecified: Secondary | ICD-10-CM

## 2020-10-17 DIAGNOSIS — R0609 Other forms of dyspnea: Secondary | ICD-10-CM

## 2020-10-17 DIAGNOSIS — R059 Cough, unspecified: Secondary | ICD-10-CM

## 2020-10-17 DIAGNOSIS — Z1329 Encounter for screening for other suspected endocrine disorder: Secondary | ICD-10-CM | POA: Diagnosis not present

## 2020-10-17 DIAGNOSIS — Z124 Encounter for screening for malignant neoplasm of cervix: Secondary | ICD-10-CM

## 2020-10-17 DIAGNOSIS — Z13220 Encounter for screening for lipoid disorders: Secondary | ICD-10-CM | POA: Diagnosis not present

## 2020-10-17 DIAGNOSIS — Z1159 Encounter for screening for other viral diseases: Secondary | ICD-10-CM | POA: Diagnosis not present

## 2020-10-17 DIAGNOSIS — Z130 Encounter for screening for diseases of the blood and blood-forming organs and certain disorders involving the immune mechanism: Secondary | ICD-10-CM

## 2020-10-17 DIAGNOSIS — Z0000 Encounter for general adult medical examination without abnormal findings: Secondary | ICD-10-CM | POA: Diagnosis not present

## 2020-10-17 DIAGNOSIS — E785 Hyperlipidemia, unspecified: Secondary | ICD-10-CM

## 2020-10-17 DIAGNOSIS — E119 Type 2 diabetes mellitus without complications: Secondary | ICD-10-CM

## 2020-10-17 DIAGNOSIS — I10 Essential (primary) hypertension: Secondary | ICD-10-CM

## 2020-10-17 DIAGNOSIS — Z131 Encounter for screening for diabetes mellitus: Secondary | ICD-10-CM

## 2020-10-17 DIAGNOSIS — Z114 Encounter for screening for human immunodeficiency virus [HIV]: Secondary | ICD-10-CM | POA: Diagnosis not present

## 2020-10-17 DIAGNOSIS — Z13228 Encounter for screening for other metabolic disorders: Secondary | ICD-10-CM

## 2020-10-17 MED ORDER — TRAZODONE HCL 50 MG PO TABS: 50.0000 mg | ORAL_TABLET | Freq: Every day | ORAL | 0 refills | Status: DC

## 2020-10-17 MED ORDER — PREDNISONE 10 MG PO TABS
ORAL_TABLET | ORAL | 0 refills | Status: AC
Start: 2020-10-17 — End: 2020-10-23

## 2020-10-17 MED ORDER — LISINOPRIL 40 MG PO TABS: 40.0000 mg | ORAL_TABLET | Freq: Every day | ORAL | 0 refills | Status: DC

## 2020-10-17 MED ORDER — ALBUTEROL SULFATE HFA 108 (90 BASE) MCG/ACT IN AERS: 2.0000 | INHALATION_SPRAY | Freq: Four times a day (QID) | RESPIRATORY_TRACT | 0 refills | Status: DC | PRN

## 2020-10-17 NOTE — Patient Instructions (Addendum)
Annual physical exam and labs today. Will call with results.    Increase Lisinopril for high blood pressure. Follow-up in 4 weeks.  Trazodone for insomnia. Follow-up in 4 weeks.   Prednisone taper and Albuterol inhaler for cough. Over-the-counter cough medication.   Preventive Care 8-45 Years Old, Female Preventive care refers to lifestyle choices and visits with your health care provider that can promote health and wellness. This includes:  A yearly physical exam. This is also called an annual wellness visit.  Regular dental and eye exams.  Immunizations.  Screening for certain conditions.  Healthy lifestyle choices, such as: ? Eating a healthy diet. ? Getting regular exercise. ? Not using drugs or products that contain nicotine and tobacco. ? Limiting alcohol use. What can I expect for my preventive care visit? Physical exam Your health care provider will check your:  Height and weight. These may be used to calculate your BMI (body mass index). BMI is a measurement that tells if you are at a healthy weight.  Heart rate and blood pressure.  Body temperature.  Skin for abnormal spots. Counseling Your health care provider may ask you questions about your:  Past medical problems.  Family's medical history.  Alcohol, tobacco, and drug use.  Emotional well-being.  Home life and relationship well-being.  Sexual activity.  Diet, exercise, and sleep habits.  Work and work Statistician.  Access to firearms.  Method of birth control.  Menstrual cycle.  Pregnancy history. What immunizations do I need? Vaccines are usually given at various ages, according to a schedule. Your health care provider will recommend vaccines for you based on your age, medical history, and lifestyle or other factors, such as travel or where you work.   What tests do I need? Blood tests  Lipid and cholesterol levels. These may be checked every 5 years, or more often if you are over 75  years old.  Hepatitis C test.  Hepatitis B test. Screening  Lung cancer screening. You may have this screening every year starting at age 46 if you have a 30-pack-year history of smoking and currently smoke or have quit within the past 15 years.  Colorectal cancer screening. ? All adults should have this screening starting at age 29 and continuing until age 54. ? Your health care provider may recommend screening at age 54 if you are at increased risk. ? You will have tests every 1-10 years, depending on your results and the type of screening test.  Diabetes screening. ? This is done by checking your blood sugar (glucose) after you have not eaten for a while (fasting). ? You may have this done every 1-3 years.  Mammogram. ? This may be done every 1-2 years. ? Talk with your health care provider about when you should start having regular mammograms. This may depend on whether you have a family history of breast cancer.  BRCA-related cancer screening. This may be done if you have a family history of breast, ovarian, tubal, or peritoneal cancers.  Pelvic exam and Pap test. ? This may be done every 3 years starting at age 45. ? Starting at age 8, this may be done every 5 years if you have a Pap test in combination with an HPV test. Other tests  STD (sexually transmitted disease) testing, if you are at risk.  Bone density scan. This is done to screen for osteoporosis. You may have this scan if you are at high risk for osteoporosis. Talk with your health care provider  about your test results, treatment options, and if necessary, the need for more tests. Follow these instructions at home: Eating and drinking  Eat a diet that includes fresh fruits and vegetables, whole grains, lean protein, and low-fat dairy products.  Take vitamin and mineral supplements as recommended by your health care provider.  Do not drink alcohol if: ? Your health care provider tells you not to drink. ? You  are pregnant, may be pregnant, or are planning to become pregnant.  If you drink alcohol: ? Limit how much you have to 0-1 drink a day. ? Be aware of how much alcohol is in your drink. In the [place] one drink equals one 12 oz bottle of beer (355 mL), one 5 oz glass of wine (148 mL), or one 1 oz glass of hard liquor (44 mL).   Lifestyle  Take daily care of your teeth and gums. Brush your teeth every morning and night with fluoride toothpaste. Floss one time each day.  Stay active. Exercise for at least 30 minutes 5 or more days each week.  Do not use any products that contain nicotine or tobacco, such as cigarettes, e-cigarettes, and chewing tobacco. If you need help quitting, ask your health care provider.  Do not use drugs.  If you are sexually active, practice safe sex. Use a condom or other form of protection to prevent STIs (sexually transmitted infections).  If you do not wish to become pregnant, use a form of birth control. If you plan to become pregnant, see your health care provider for a prepregnancy visit.  If told by your health care provider, take low-dose aspirin daily starting at age 50.  Find healthy ways to cope with stress, such as: ? Meditation, yoga, or listening to music. ? Journaling. ? Talking to a trusted person. ? Spending time with friends and family. Safety  Always wear your seat belt while driving or riding in a vehicle.  Do not drive: ? If you have been drinking alcohol. Do not ride with someone who has been drinking. ? When you are tired or distracted. ? While texting.  Wear a helmet and other protective equipment during sports activities.  If you have firearms in your house, make sure you follow all gun safety procedures. What's next?  Visit your health care provider once a year for an annual wellness visit.  Ask your health care provider how often you should have your eyes and teeth checked.  Stay up to date on all vaccines. This information  is not intended to replace advice given to you by your health care provider. Make sure you discuss any questions you have with your health care provider. Document Revised: 05/01/2020 Document Reviewed: 04/08/2018 Elsevier Patient Education  2021 Elsevier Inc.  

## 2020-10-17 NOTE — Progress Notes (Signed)
Physical  Walking short distances experiences chest tightness Dry cough  Previous provider prescribed prednisone for cough and heavy chest feeling  Wants to be referred to Pulmonology to check for COPD

## 2020-10-17 NOTE — Progress Notes (Signed)
Patient ID: Sheri [name], female    DOB: [birth year]  MRN: [id number]  CC: Annual Physical Exam  Subjective: Sheri [name] is a 45 y.o. female who presents for annual physical exam.  Her concerns today include:   1. HYPERTENSION FOLLOW-UP: 10/01/2020: - Uncontrolled.  - Begin taking Lisinopril and Amlodipine as prescribed.  - Follow-up within 2 weeks for blood pressure check.  10/17/2020: Currently taking: see medication list  Med Adherence: [x]  Yes    []  No Medication side effects: []  Yes    [x]  No Home Monitoring?: [x]  Yes, 140's/90's Smoking [x]  Yes []  No SOB? [x]  Yes    []  No  2. SHORTNESS OF BREATH: Ongoing for several months. Shortness of breath with activity and at rest. Alternating between two inhalers. One inhaler being ProAir and not quite sure of the name of the second inhaler which she was able to get from a friend. Using either inhaler about 2 to 3 times daily. Taking Mucinex. Denies chest pain but does have chest tightness. Smoking about 5 to 6 cigarettes most days for at least 20 years or greater, she would like to quit. The weather tends to make it worse. Overall not feeling well. Would like referral to Pulmonology for consideration of possible COPD.  3. 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 Having insomnia once returning home from work for about 3 months. Has tried over-the-counter Melatonin without relief. Would like to try insomnia medication.   Patient Active Problem List   Diagnosis Date Noted  . Type 2 diabetes mellitus ([place]) 10/18/2020  . Hyperlipidemia 10/18/2020  . Rectal bleeding 09/27/2020  . Hypertension 09/27/2020  . 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 Plantar fasciitis 03/03/2019     Current Outpatient Medications on File Prior to Visit  Medication Sig Dispense Refill  . amLODipine (NORVASC) 10 MG tablet Take 1 tablet (10 mg total) by mouth daily. 30 tablet 0  . pantoprazole (PROTONIX) 40 MG tablet Take 1 tablet (40 mg total) by  mouth daily. 30 tablet 1   No current facility-administered medications on file prior to visit.    Allergies  Allergen Reactions  . Amoxicillin Hives and Rash  . Iodine Anaphylaxis and Swelling  . Penicillins Hives, Itching and Rash  . Erythromycin Swelling and Other (See Comments)  . Metformin Diarrhea    Social History   Socioeconomic History  . Marital status: Single    Spouse name: Not on file  . Number of children: Not on file  . Years of education: Not on file  . Highest education level: Not on file  Occupational History  . Not on file  Tobacco Use  . Smoking status: Current Some Day Smoker    Packs/day: 0.00    Types: Cigarettes  . Smokeless tobacco: Never Used  Vaping Use  . Vaping Use: Not on file  Substance and Sexual Activity  . Alcohol use: Not Currently  . Drug use: Not Currently  . Sexual activity: Never    Birth control/protection: None  Other Topics Concern  . Not on file  Social History Narrative   ** Merged History Encounter **       Social Determinants of Health   Financial Resource Strain: Not on file  Food Insecurity: Not on file  Transportation Needs: Not on file  Physical Activity: Not on file  Stress: Not on file  Social Connections: Not on file  Intimate Partner Violence: Not on file    Family History  Problem Relation Age of  Onset  . Hypertension Mother   . Diabetes Mother   . Diabetes Father   . Hypertension Father   . Colon cancer Maternal Grandfather   . Diabetes Paternal Grandmother     Past Surgical History:  Procedure Laterality Date  . BASAL CELL CARCINOMA EXCISION     ed records 07/26/2020  . CERVICAL DISC SURGERY      ROS: Review of Systems Negative except as stated above  PHYSICAL EXAM: BP 139/77 (BP Location: Left Arm, Patient Position: Sitting)   Pulse 97   Ht 5' 8.9" (1.75 m)   Wt 257 lb (116.6 kg)   SpO2 97%   BMI 38.06 kg/m    Wt Readings from Last 3 Encounters:  10/17/20 257 lb (116.6 kg)   10/01/20 260 lb 9.6 oz (118.2 kg)  09/27/20 262 lb 9.6 oz (119.1 kg)     Physical Exam Exam conducted with a chaperone present.  Constitutional:      Appearance: She is obese.  HENT:     Head: Normocephalic and atraumatic.     Right Ear: Tympanic membrane, ear canal and external ear normal.     Left Ear: Tympanic membrane, ear canal and external ear normal.     Nose: Nose normal.     Mouth/Throat:     Mouth: Mucous membranes are moist.     Pharynx: Oropharynx is clear.  Eyes:     Extraocular Movements: Extraocular movements intact.     Conjunctiva/sclera: Conjunctivae normal.     Pupils: Pupils are equal, round, and reactive to light.  Cardiovascular:     Rate and Rhythm: Normal rate and regular rhythm.     Pulses: Normal pulses.     Heart sounds: Normal heart sounds.  Pulmonary:     Effort: Pulmonary effort is normal.     Breath sounds: Normal breath sounds.  Chest:  Breasts:     Right: Normal.     Left: Normal.      Comments: Elmon Else, CMA present during examination.  Abdominal:     General: Bowel sounds are normal.     Palpations: Abdomen is soft.  Genitourinary:    Comments: Patient declined examination. Musculoskeletal:        General: Normal range of motion.     Cervical back: Normal range of motion and neck supple.  Skin:    General: Skin is warm and dry.     Capillary Refill: Capillary refill takes less than 2 seconds.  Neurological:     General: No focal deficit present.     Mental Status: She is alert and oriented to person, place, and time.  Psychiatric:        Mood and Affect: Mood normal.        Behavior: Behavior normal.     ASSESSMENT AND PLAN: 1. Annual physical exam: - Counseled on 150 minutes of exercise per week as tolerated, healthy eating (including decreased daily intake of saturated fats, cholesterol, added sugars, sodium), STI prevention, and routine healthcare maintenance.  2. Screening for metabolic disorder: - BMP last  obtained 10/01/2020. - Hepatic function panel to check liver function.  - Hepatic function panel  3. Screening for deficiency anemia: - CBC to screen for anemia. - CBC  4. Diabetes mellitus screening: - Hemoglobin A1c to screen for pre-diabetes/diabetes. - Hemoglobin A1c  5. Screening cholesterol level: - Lipid panel to screen for high cholesterol.  - Lipid panel  6. Thyroid disorder screen: - TSH to check thyroid function.  -  TSH+T4F+T3Free  7. Need for hepatitis C screening test: - HCV antibody to screen for hepatitis C.  - HCV Ab w/Rflx to Verification  8. Encounter for screening for HIV: - HIV antibody to screen for human immunodeficiency virus.  - HIV antibody (with reflex)  9. Cervical cancer screening: - Patient reports she has appointment scheduled soon for PAP smear.   10. Essential hypertension: - Blood pressure improved since last visit and closer to goal.  - Home blood pressure tends to average 140's/90's. - Increase Lisinopril from 30 mg daily to 40 mg daily.  -Follow-up within 4 weeks for blood pressure check. Write down your blood pressure readings each day and bring those results along with your home blood pressure monitor to your appointment. - Counseled on blood pressure goal of less than 130/80, low-sodium, DASH diet, medication compliance, 150 minutes of moderate intensity exercise per week as tolerated. Discussed medication compliance, adverse effects. - lisinopril (ZESTRIL) 40 MG tablet; Take 1 tablet (40 mg total) by mouth daily.  Dispense: 30 tablet; Refill: 0  11. Cough: 12. Dyspnea on exertion: - Ongoing for several months. Shortness of breath with activity and at rest. Alternating between two inhalers. One inhaler being ProAir and not quite sure of the name of the second inhaler which she was able to get from a friend. Using either inhaler about 2 to 3 times daily. Taking Mucinex. Denies chest pain but does have chest tightness. Smoking about 5 to 6  cigarettes most days for at least 20 years or greater, she would like to quit. The weather tends to make it worse. Overall not feeling well. Would like referral to Pulmonology for consideration of possible COPD. - Patient stable during today's visit and without cardiac or respiratory distress.  - Prednisone and Albuterol inhaler as prescribed.  - Per patient request referral to Pulmonology for further evaluation and management.  - Ambulatory referral to Pulmonology - predniSONE (DELTASONE) 10 MG tablet; Take 6 tablets (60 mg total) by mouth daily with breakfast for 1 day, THEN 5 tablets (50 mg total) daily with breakfast for 1 day, THEN 4 tablets (40 mg total) daily with breakfast for 1 day, THEN 3 tablets (30 mg total) daily with breakfast for 1 day, THEN 2 tablets (20 mg total) daily with breakfast for 1 day, THEN 1 tablet (10 mg total) daily with breakfast for 1 day.  Dispense: 21 tablet; Refill: 0 - albuterol (VENTOLIN HFA) 108 (90 Base) MCG/ACT inhaler; Inhale 2 puffs into the lungs every 6 (six) hours as needed for wheezing or shortness of breath.  Dispense: 8 g; Refill: 0  13. Insomnia, unspecified type: - Will try course of Trazodone for insomnia. May cause drowsiness. Counseled patient to not consume if operating heavy machinery or driving. Counseled patient to not consume with alcohol or illicit substances. Patient verbalized understanding.  - Follow-up with primary provider within 4 weeks or sooner if needed.  - traZODone (DESYREL) 50 MG tablet; Take 1 tablet (50 mg total) by mouth at bedtime.  Dispense: 30 tablet; Refill: 0   Patient was given the opportunity to ask questions.  Patient verbalized understanding of the plan and was able to repeat key elements of the plan. Patient was given clear instructions to go to Emergency Department or return to medical center if symptoms don't improve, worsen, or new problems develop.The patient verbalized understanding.   Orders Placed This  Encounter  Procedures  . Hepatic function panel  . CBC  . Lipid panel  .  TSH+T4F+T3Free  . Hemoglobin A1c  . HCV Ab w/Rflx to Verification  . HIV antibody (with reflex)  . Interpretation:  . CBC  . Ambulatory referral to Pulmonology     Requested Prescriptions   Signed Prescriptions Disp Refills  . lisinopril (ZESTRIL) 40 MG tablet 30 tablet 0    Sig: Take 1 tablet (40 mg total) by mouth daily.  . predniSONE (DELTASONE) 10 MG tablet 21 tablet 0    Sig: Take 6 tablets (60 mg total) by mouth daily with breakfast for 1 day, THEN 5 tablets (50 mg total) daily with breakfast for 1 day, THEN 4 tablets (40 mg total) daily with breakfast for 1 day, THEN 3 tablets (30 mg total) daily with breakfast for 1 day, THEN 2 tablets (20 mg total) daily with breakfast for 1 day, THEN 1 tablet (10 mg total) daily with breakfast for 1 day.  . albuterol (VENTOLIN HFA) 108 (90 Base) MCG/ACT inhaler 8 g 0    Sig: Inhale 2 puffs into the lungs every 6 (six) hours as needed for wheezing or shortness of breath.  . traZODone (DESYREL) 50 MG tablet 30 tablet 0    Sig: Take 1 tablet (50 mg total) by mouth at bedtime.  . Dulaglutide (TRULICITY) 5.70 VX/7.9TJ SOPN 3 mL 0    Sig: Inject 0.75 mg into the skin once a week.  . Insulin Pen Needle (BD PEN NEEDLE MICRO U/F) 32G X 6 MM MISC 100 each 1    Sig: One injection weekly.  . Blood Glucose Monitoring Suppl (TRUE METRIX METER) w/Device KIT 1 kit 0    Sig: Use as directed  . glucose blood (TRUE METRIX BLOOD GLUCOSE TEST) test strip 100 each 12    Sig: Use as instructed  . TRUEplus Lancets 28G MISC 100 each 4    Sig: Use as directed  . atorvastatin (LIPITOR) 20 MG tablet 90 tablet 0    Sig: Take 1 tablet (20 mg total) by mouth daily.  . Alcohol Swabs (ALCOHOL PREP) 70 % PADS 100 each 1    Sig: One injection weekly.    Return in about 4 weeks (around 11/14/2020) for Follow-Up hypertension and insomnia .  Camillia Herter, NP

## 2020-10-18 ENCOUNTER — Encounter: Payer: Self-pay | Admitting: Family

## 2020-10-18 DIAGNOSIS — E785 Hyperlipidemia, unspecified: Secondary | ICD-10-CM | POA: Insufficient documentation

## 2020-10-18 DIAGNOSIS — E119 Type 2 diabetes mellitus without complications: Secondary | ICD-10-CM | POA: Insufficient documentation

## 2020-10-18 LAB — CBC
Hematocrit: 43.8 % (ref 34.0–46.6)
Hemoglobin: 14.7 g/dL (ref 11.1–15.9)
MCH: 28.9 pg (ref 26.6–33.0)
MCHC: 33.6 g/dL (ref 31.5–35.7)
MCV: 86 fL (ref 79–97)
Platelets: 304 10*3/uL (ref 150–450)
RBC: 5.08 x10E6/uL (ref 3.77–5.28)
RDW: 13.1 % (ref 11.7–15.4)
WBC: 11.8 10*3/uL — ABNORMAL HIGH (ref 3.4–10.8)

## 2020-10-18 LAB — LIPID PANEL
Chol/HDL Ratio: 5.1 ratio — ABNORMAL HIGH (ref 0.0–4.4)
Cholesterol, Total: 276 mg/dL — ABNORMAL HIGH (ref 100–199)
HDL: 54 mg/dL (ref >39)
LDL Chol Calc (NIH): 184 mg/dL — ABNORMAL HIGH (ref 0–99)
Triglycerides: 201 mg/dL — ABNORMAL HIGH (ref 0–149)
VLDL Cholesterol Cal: 38 mg/dL (ref 5–40)

## 2020-10-18 LAB — HEPATIC FUNCTION PANEL
ALT: 30 IU/L (ref 0–32)
AST: 28 IU/L (ref 0–40)
Albumin: 4.9 g/dL — ABNORMAL HIGH (ref 3.8–4.8)
Alkaline Phosphatase: 105 IU/L (ref 44–121)
Bilirubin Total: 0.5 mg/dL (ref 0.0–1.2)
Bilirubin, Direct: 0.1 mg/dL (ref 0.00–0.40)
Total Protein: 7.7 g/dL (ref 6.0–8.5)

## 2020-10-18 LAB — HIV ANTIBODY (ROUTINE TESTING W REFLEX): HIV Screen 4th Generation wRfx: NONREACTIVE

## 2020-10-18 LAB — HEMOGLOBIN A1C
Est. average glucose Bld gHb Est-mCnc: 214 mg/dL
Hgb A1c MFr Bld: 9.1 % — ABNORMAL HIGH (ref 4.8–5.6)

## 2020-10-18 LAB — TSH+T4F+T3FREE
Free T4: 1.02 ng/dL (ref 0.82–1.77)
T3, Free: 3.1 pg/mL (ref 2.0–4.4)
TSH: 0.56 u[IU]/mL (ref 0.450–4.500)

## 2020-10-18 LAB — HCV INTERPRETATION

## 2020-10-18 LAB — HCV AB W/RFLX TO VERIFICATION: HCV Ab: <0.1 s/co ratio (ref 0.0–0.9)

## 2020-10-18 MED ORDER — TRUE METRIX BLOOD GLUCOSE TEST VI STRP: ORAL_STRIP | 12 refills | Status: DC

## 2020-10-18 MED ORDER — TRUEPLUS LANCETS 28G MISC: 4 refills | Status: DC

## 2020-10-18 MED ORDER — BD PEN NEEDLE MICRO U/F 32G X 6 MM MISC
1 refills | Status: DC
Start: 2020-10-18 — End: 2021-07-23

## 2020-10-18 MED ORDER — TRULICITY 0.75 MG/0.5ML ~~LOC~~ SOAJ: 0.7500 mg | SUBCUTANEOUS | 0 refills | Status: DC

## 2020-10-18 MED ORDER — ALCOHOL PREP 70 % PADS: MEDICATED_PAD | 1 refills | Status: AC

## 2020-10-18 MED ORDER — TRUE METRIX METER W/DEVICE KIT: PACK | 0 refills | Status: AC

## 2020-10-18 MED ORDER — ATORVASTATIN CALCIUM 20 MG PO TABS: 20.0000 mg | ORAL_TABLET | Freq: Every day | ORAL | 0 refills | Status: DC

## 2020-10-18 NOTE — Progress Notes (Signed)
Liver function normal.   Thyroid normal.   WBC's, sometimes called infection fighters, mildly elevated. We will recheck in 6 weeks. Please schedule patient lab appointment for this.   Hemoglobin A1c is consistent with diabetes. Practice healthy eating habits of fresh fruit and vegetables, lean baked meats such as chicken, fish, and [place]; limit breads, rice, pastas, and desserts; practice regular aerobic exercise (at least 150 minutes a week as tolerated).   Trulicity prescribed for diabetes management. Follow-up in 4 weeks or sooner if needed for diabetes checkup. Please schedule patient appointment for this.  A glucose meter, lancets, and test strips also prescribed for diabetes. Please record blood sugars with the date and bring to appointment in 4 weeks.   A fasting blood sugar of 80 to 130 mg/dL and a postprandial glucose (90 to 120 minutes after a meal) less than 180 mg/dL is the target goal.    Cholesterol higher than expected. High cholesterol may increase risk of heart attack and/or stroke. Consider eating more fruits, vegetables, and lean baked meats such as chicken or fish. Moderate intensity exercise at least 150 minutes as tolerated per week may help as well.    Atorvastatin prescribed for high cholesterol. Patient encouraged to schedule appointment to have this rechecked in 3 months.

## 2020-10-22 ENCOUNTER — Encounter: Payer: Self-pay | Admitting: Family

## 2020-10-28 ENCOUNTER — Ambulatory Visit
Admission: RE | Admit: 2020-10-28 | Discharge: 2020-10-28 | Discharge status: Against Medical Advice | Payer: 59 | Source: Ambulatory Visit | Attending: Family | Admitting: Family

## 2020-10-28 ENCOUNTER — Other Ambulatory Visit: Payer: Self-pay

## 2020-10-28 DIAGNOSIS — J189 Pneumonia, unspecified organism: Secondary | ICD-10-CM | POA: Diagnosis not present

## 2020-10-28 DIAGNOSIS — R059 Cough, unspecified: Secondary | ICD-10-CM | POA: Diagnosis not present

## 2020-10-28 DIAGNOSIS — Z20822 Contact with and (suspected) exposure to covid-19: Secondary | ICD-10-CM | POA: Diagnosis not present

## 2020-10-28 NOTE — ED Notes (Signed)
Per PCA: accidentally registered in error; pt not present

## 2020-10-29 ENCOUNTER — Telehealth: Payer: Self-pay | Admitting: Family

## 2020-10-29 NOTE — Telephone Encounter (Signed)
Received via fax [employer]ML[employer] Forms for provider to complete. Placed in PCP's bin at front office.

## 2020-10-30 NOTE — Telephone Encounter (Signed)
Called pt. She said she was out of work Tue 3/15 & 3/16 sick. Employer wants forms filled out for those dates. Pt states she now has pneumonia. Pt states her normal work schedule is Mon, [place], Wed each week. Call pt with further questions [phone].

## 2020-10-31 ENCOUNTER — Ambulatory Visit: Payer: 59 | Admitting: Gastroenterology

## 2020-10-31 VITALS — BP 142/86 | HR 80

## 2020-10-31 DIAGNOSIS — K625 Hemorrhage of anus and rectum: Secondary | ICD-10-CM

## 2020-11-01 NOTE — Progress Notes (Signed)
Noted pt has been advised to keep procedures as planned

## 2020-11-01 NOTE — Progress Notes (Signed)
Pt arrived for BP and HR check.  Results sent to Dr Rush Landmark

## 2020-11-06 ENCOUNTER — Telehealth: Payer: Self-pay | Admitting: Family

## 2020-11-06 ENCOUNTER — Other Ambulatory Visit (HOSPITAL_COMMUNITY): Payer: Self-pay | Admitting: Family

## 2020-11-06 ENCOUNTER — Other Ambulatory Visit: Payer: Self-pay

## 2020-11-06 DIAGNOSIS — E119 Type 2 diabetes mellitus without complications: Secondary | ICD-10-CM

## 2020-11-06 DIAGNOSIS — I10 Essential (primary) hypertension: Secondary | ICD-10-CM

## 2020-11-06 MED ORDER — AMLODIPINE BESYLATE 10 MG PO TABS: 10.0000 mg | ORAL_TABLET | Freq: Every day | ORAL | 2 refills | Status: DC

## 2020-11-06 MED ORDER — TRULICITY 0.75 MG/0.5ML ~~LOC~~ SOAJ: 0.7500 mg | SUBCUTANEOUS | 0 refills | Status: DC

## 2020-11-06 NOTE — Telephone Encounter (Signed)
Pt called Stating that she needs a medicaction Refill on;  amLODipine (NORVASC) 10 MG tablet  Dulaglutide (TRULICITY) 8.38 FM/4.0RF SOPN  Pt would like medication sent to;  [id number] [place] [place] - [address]

## 2020-11-06 NOTE — Progress Notes (Signed)
Amlodipine/Trulicity refilled

## 2020-11-07 ENCOUNTER — Ambulatory Visit (AMBULATORY_SURGERY_CENTER): Payer: 59 | Admitting: Gastroenterology

## 2020-11-07 ENCOUNTER — Encounter: Payer: Self-pay | Admitting: Gastroenterology

## 2020-11-07 ENCOUNTER — Other Ambulatory Visit: Payer: Self-pay

## 2020-11-07 VITALS — BP 105/72 | HR 87 | Temp 96.9°F | Resp 18 | Ht 69.0 in | Wt 262.0 lb

## 2020-11-07 DIAGNOSIS — K625 Hemorrhage of anus and rectum: Secondary | ICD-10-CM

## 2020-11-07 DIAGNOSIS — K5730 Diverticulosis of large intestine without perforation or abscess without bleeding: Secondary | ICD-10-CM | POA: Diagnosis not present

## 2020-11-07 DIAGNOSIS — D128 Benign neoplasm of rectum: Secondary | ICD-10-CM

## 2020-11-07 DIAGNOSIS — D129 Benign neoplasm of anus and anal canal: Secondary | ICD-10-CM

## 2020-11-07 DIAGNOSIS — K219 Gastro-esophageal reflux disease without esophagitis: Secondary | ICD-10-CM | POA: Diagnosis not present

## 2020-11-07 DIAGNOSIS — K621 Rectal polyp: Secondary | ICD-10-CM

## 2020-11-07 DIAGNOSIS — K641 Second degree hemorrhoids: Secondary | ICD-10-CM | POA: Diagnosis not present

## 2020-11-07 DIAGNOSIS — K449 Diaphragmatic hernia without obstruction or gangrene: Secondary | ICD-10-CM

## 2020-11-07 DIAGNOSIS — K2289 Other specified disease of esophagus: Secondary | ICD-10-CM

## 2020-11-07 MED ORDER — SODIUM CHLORIDE 0.9 % IV SOLN: 500.0000 mL | Freq: Once | INTRAVENOUS | Status: DC

## 2020-11-07 NOTE — Progress Notes (Signed)
pt tolerated well. VSS. awake and to recovery. Report given to RN. Bite block insitu to recovey.

## 2020-11-07 NOTE — Op Note (Signed)
[facility] Patient Name: Sheri [name] Procedure Date: 11/07/2020 1:42 PM MRN: [id number] Endoscopist: Justice Britain , MD Age: 45 Referring MD:  Date of Birth: July 19, 1976 Gender: Female Account #: [id number] Procedure:                Upper GI endoscopy Indications:              Heartburn, Gastro-esophageal reflux disease Medicines:                Monitored Anesthesia Care Procedure:                Pre-Anesthesia Assessment:                           - Prior to the procedure, a History and Physical                            was performed, and patient medications and                            allergies were reviewed. The patient's tolerance of                            previous anesthesia was also reviewed. The risks                            and benefits of the procedure and the sedation                            options and risks were discussed with the patient.                            All questions were answered, and informed consent                            was obtained. Prior Anticoagulants: The patient has                            taken no previous anticoagulant or antiplatelet                            agents. ASA Grade Assessment: III - A patient with                            severe systemic disease. After reviewing the risks                            and benefits, the patient was deemed in                            satisfactory condition to undergo the procedure.                           After obtaining informed consent, the endoscope was  passed under direct vision. Throughout the                            procedure, the patient's blood pressure, pulse, and                            oxygen saturations were monitored continuously. The                            Endoscope was introduced through the mouth, and                            advanced to the second part of duodenum. The upper                            GI  endoscopy was accomplished without difficulty.                            The patient tolerated the procedure. Scope In: Scope Out: Findings:                 No gross lesions were noted in the entire                            esophagus. Biopsies were taken with a cold forceps                            for histology to rule out EoE/LoE.                           The Z-line was regular and was found 42 cm from the                            incisors.                           A 2 cm hiatal hernia was present.                           Patchy mildly erythematous mucosa without bleeding                            was found in the gastric body.                           No other gross lesions were noted in the entire                            examined stomach. Biopsies were taken with a cold                            forceps for histology and Helicobacter pylori                            testing.  No gross lesions were noted in the duodenal bulb,                            in the first portion of the duodenum and in the                            second portion of the duodenum. Complications:            No immediate complications. Estimated Blood Loss:     Estimated blood loss was minimal. Impression:               - No gross lesions in esophagus. Biopsied.                           - Z-line regular, 42 cm from the incisors.                           - 2 cm hiatal hernia.                           - Erythematous mucosa in the gastric body. No other                            gross lesions in the stomach. Biopsied.                           - No gross lesions in the duodenal bulb, in the                            first portion of the duodenum and in the second                            portion of the duodenum. Recommendation:           - Proceed to scheduled colonoscopy.                           - Observe patient's clinical course.                           -  Await pathology results.                           - Continue current PPI therapy at 40 mg daily if it                            controls GERD vs if in future able to titrate                            downwards to 20 mg daily to get patient on                            effective lowest dose as able.                           -  The findings and recommendations were discussed                            with the patient.                           - The findings and recommendations were discussed                            with the patient's family. Justice Britain, MD 11/07/2020 2:21:03 PM

## 2020-11-07 NOTE — Patient Instructions (Signed)
Please read handouts provided. Continue present medications. Await pathology results. High Fiber diet. Use FiberCon 1-2 tablets daily. Take Sitz baths every night as needed. Preparation H cream: apply externally once to twice daily as needed. Consider Anusol suppositories if issues recur. Continue current PPI therapy at 40 mg if it controls GERD. If in future try to titrate downwards to 20 mg daily to get on lowest effective dose.     YOU HAD AN ENDOSCOPIC PROCEDURE TODAY AT [facility]:   Refer to the procedure report that was given to you for any specific questions about what was found during the examination.  If the procedure report does not answer your questions, please call your gastroenterologist to clarify.  If you requested that your care partner not be given the details of your procedure findings, then the procedure report has been included in a sealed envelope for you to review at your convenience later.  YOU SHOULD EXPECT: Some feelings of bloating in the abdomen. Passage of more gas than usual.  Walking can help get rid of the air that was put into your GI tract during the procedure and reduce the bloating. If you had a lower endoscopy (such as a colonoscopy or flexible sigmoidoscopy) you may notice spotting of blood in your stool or on the toilet paper. If you underwent a bowel prep for your procedure, you may not have a normal bowel movement for a few days.  Please Note:  You might notice some irritation and congestion in your nose or some drainage.  This is from the oxygen used during your procedure.  There is no need for concern and it should clear up in a day or so.  SYMPTOMS TO REPORT IMMEDIATELY:   Following lower endoscopy (colonoscopy or flexible sigmoidoscopy):  Excessive amounts of blood in the stool  Significant tenderness or worsening of abdominal pains  Swelling of the abdomen that is new, acute  Fever of 100F or higher   Following upper  endoscopy (EGD)  Vomiting of blood or coffee ground material  New chest pain or pain under the shoulder blades  Painful or persistently difficult swallowing  New shortness of breath  Fever of 100F or higher  Black, tarry-looking stools  For urgent or emergent issues, a gastroenterologist can be reached at any hour by calling [phone]. Do not use MyChart messaging for urgent concerns.    DIET:  We do recommend a small meal at first, but then you may proceed to your regular diet.  Drink plenty of fluids but you should avoid alcoholic beverages for 24 hours.  ACTIVITY:  You should plan to take it easy for the rest of today and you should NOT DRIVE or use heavy machinery until tomorrow (because of the sedation medicines used during the test).    FOLLOW UP: Our staff will call the number listed on your records 48-72 hours following your procedure to check on you and address any questions or concerns that you may have regarding the information given to you following your procedure. If we do not reach you, we will leave a message.  We will attempt to reach you two times.  During this call, we will ask if you have developed any symptoms of COVID 19. If you develop any symptoms (ie: fever, flu-like symptoms, shortness of breath, cough etc.) before then, please call [phone].  If you test positive for Covid 19 in the 2 weeks post procedure, please call and report this information to [place].  If any biopsies were taken you will be contacted by phone or by letter within the next 1-3 weeks.  Please call us at [phone] if you have not heard about the biopsies in 3 weeks.    SIGNATURES/CONFIDENTIALITY: You and/or your care partner have signed paperwork which will be entered into your electronic medical record.  These signatures attest to the fact that that the information above on your After Visit Summary has been reviewed and is understood.  Full responsibility of the confidentiality of this  discharge information lies with you and/or your care-partner.

## 2020-11-07 NOTE — Progress Notes (Signed)
Called to room to assist during endoscopic procedure.  Patient ID and intended procedure confirmed with present staff. Received instructions for my participation in the procedure from the performing physician.  

## 2020-11-07 NOTE — Progress Notes (Signed)
Vitals-AG  History reviewed.

## 2020-11-07 NOTE — Op Note (Signed)
[facility] Patient Name: Sheri [name] Procedure Date: 11/07/2020 1:41 PM MRN: [id number] Endoscopist: Justice Britain , MD Age: 45 Referring MD:  Date of Birth: November 13, 1975 Gender: Female Account #: [id number] Procedure:                Colonoscopy Indications:              Colon cancer screening in patient at increased                            risk: Family history of 1st-degree relative with                            colon polyps, Incidental - Hematochezia Medicines:                Monitored Anesthesia Care Procedure:                Pre-Anesthesia Assessment:                           - Prior to the procedure, a History and Physical                            was performed, and patient medications and                            allergies were reviewed. The patient's tolerance of                            previous anesthesia was also reviewed. The risks                            and benefits of the procedure and the sedation                            options and risks were discussed with the patient.                            All questions were answered, and informed consent                            was obtained. Prior Anticoagulants: The patient has                            taken no previous anticoagulant or antiplatelet                            agents. ASA Grade Assessment: III - A patient with                            severe systemic disease. After reviewing the risks                            and benefits, the patient was deemed in  satisfactory condition to undergo the procedure.                           After obtaining informed consent, the colonoscope                            was passed under direct vision. Throughout the                            procedure, the patient's blood pressure, pulse, and                            oxygen saturations were monitored continuously. The                            Olympus  CF-HQ190L [id number]) Colonoscope was                            introduced through the anus and advanced to the 5                            cm into the ileum. The colonoscopy was performed                            without difficulty. The patient tolerated the                            procedure. The quality of the bowel preparation was                            good. The terminal ileum, ileocecal valve,                            appendiceal orifice, and rectum were photographed. Scope In: 2:02:04 PM Scope Out: 2:12:27 PM Scope Withdrawal Time: 0 hours 8 minutes 9 seconds  Total Procedure Duration: 0 hours 10 minutes 23 seconds  Findings:                 The digital rectal exam findings include                            hemorrhoids. Pertinent negatives include no                            palpable rectal lesions.                           The terminal ileum and ileocecal valve appeared                            normal.                           A few small-mouthed diverticula were found in the  sigmoid colon and descending colon.                           A 2 mm polyp was found in the rectum. The polyp was                            sessile. The polyp was removed with a cold snare.                            Resection and retrieval were complete.                           Normal mucosa was found in the entire colon.                           Non-bleeding non-thrombosed external and internal                            hemorrhoids were found during retroflexion, during                            perianal exam and during digital exam. The                            hemorrhoids were Grade II (internal hemorrhoids                            that prolapse but reduce spontaneously). Complications:            No immediate complications. Estimated Blood Loss:     Estimated blood loss was minimal. Impression:               - Hemorrhoids found on digital rectal  exam.                           - The examined portion of the ileum was normal.                           - Diverticulosis in the sigmoid colon and in the                            descending colon.                           - One 2 mm polyp in the rectum, removed with a cold                            snare. Resected and retrieved.                           - Normal mucosa in the entire examined colon                            otherwise.                           -  Non-bleeding non-thrombosed external and internal                            hemorrhoids. Recommendation:           - The patient will be observed post-procedure,                            until all discharge criteria are met.                           - Discharge patient to home.                           - Patient has a contact number available for                            emergencies. The signs and symptoms of potential                            delayed complications were discussed with the                            patient. Return to normal activities tomorrow.                            Written discharge instructions were provided to the                            patient.                           - High fiber diet.                           - Use FiberCon 1-2 tablets PO daily.                           - Continue present medications.                           - Take Sitz baths QHS as needed.                           - Preparation H cream: Apply externally once to BID                            PRN.                           - Can consider potential use of Anusol                            suppositories in future if issues recur.                           - Await pathology results.                           -  Follow up colonoscopy can be considered in                            5-years due to the family history of colon polyps                            and if no adenomatous tissue is found on that                             examination, then can return to normal colon cancer                            average risk screening. Certainly will await the                            reports of the final pathology however.                           - The findings and recommendations were discussed                            with the patient.                           - The findings and recommendations were discussed                            with the patient's family. Justice Britain, MD 11/07/2020 2:26:50 PM

## 2020-11-08 ENCOUNTER — Ambulatory Visit (INDEPENDENT_AMBULATORY_CARE_PROVIDER_SITE_OTHER): Payer: 59

## 2020-11-08 ENCOUNTER — Encounter: Payer: Self-pay | Admitting: Internal Medicine

## 2020-11-08 ENCOUNTER — Ambulatory Visit (INDEPENDENT_AMBULATORY_CARE_PROVIDER_SITE_OTHER): Payer: 59 | Admitting: Internal Medicine

## 2020-11-08 DIAGNOSIS — I10 Essential (primary) hypertension: Secondary | ICD-10-CM | POA: Diagnosis not present

## 2020-11-08 DIAGNOSIS — R058 Other specified cough: Secondary | ICD-10-CM

## 2020-11-08 DIAGNOSIS — R059 Cough, unspecified: Secondary | ICD-10-CM | POA: Diagnosis not present

## 2020-11-08 DIAGNOSIS — F17210 Nicotine dependence, cigarettes, uncomplicated: Secondary | ICD-10-CM | POA: Diagnosis not present

## 2020-11-08 MED ORDER — OLMESARTAN MEDOXOMIL 40 MG PO TABS: 40.0000 mg | ORAL_TABLET | Freq: Every day | ORAL | 11 refills | Status: DC

## 2020-11-08 MED ORDER — FAMOTIDINE 20 MG PO TABS: ORAL_TABLET | ORAL | 11 refills | Status: AC

## 2020-11-08 MED ORDER — METHYLPREDNISOLONE ACETATE 80 MG/ML IJ SUSP
120.0000 mg | Freq: Once | INTRAMUSCULAR | Status: AC
  Administered 2020-11-08: 120 mg via INTRAMUSCULAR

## 2020-11-08 NOTE — Assessment & Plan Note (Signed)
Suggested e-cigs as an optional  "one way bridge"  Off all tobacco products         Each maintenance medication was reviewed in detail including emphasizing most importantly the difference between maintenance and prns and under what circumstances the prns are to be triggered using an action plan format where appropriate.  Total time for H and P, chart review, counseling,   and generating customized AVS unique to this office visit / same day charting = 52 min

## 2020-11-08 NOTE — Assessment & Plan Note (Signed)
D/c acei 11/08/2020 due to refractory cough   In the best review of chronic cough to date ( NEJM 2016 375 9611-6435) ,  ACEi are now felt to cause cough in up to  20% of pts which is a 4 fold increase from previous reports and does not include the variety of non-specific complaints we see in pulmonary clinic in pts on ACEi but previously attributed to another dx like  Copd/asthma and  include PNDS, "throat and chest congestion", "bronchitis", unexplained dyspnea and noct "strangling" sensations, and hoarseness, but also  atypical /refractory GERD symptoms like dysphagia and "bad heartburn"   The only way I know  to prove this is not an "ACEi Case" is a trial off ACEi x a minimum of 6 weeks then regroup.   rec benicare 40 mg daily and f/u with PCP if better, if not, return here.

## 2020-11-08 NOTE — Progress Notes (Signed)
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 Onset much worse early  March 2022 initally rx prednisone seen at [facility] 10/28/20 with ? pna R side rx  > doxy and referred to pulmonary clinic 11/08/2020 by Gypsy Balsam still with severe cough p completed doxy 11/05/20.     History of Present Illness  11/08/2020  Pulmonary/ 1st office eval/Pravin Perezperez  Chief Complaint  Patient presents with  . Pulmonary Consult    Referred by Durene Fruits, NP. Pt c/o cough off and on for years- related to change in the weather. She had PNA 10/30/20. Her cough is mainly non prod.  She is using her albuterol inhaler 4 x daily on average.   Dyspnea:  Not limited by breathing from desired activities  Unless coughing  Cough: still feels like stuffy nose/ hacking cough slt yellow at hs min production  Sleep: wedge pillow bed is flat  SABA use: no neb since 11/06/20  Has overt hb when lies down on wedge  Coughs so hard > urinary incont  No obvious day to day or daytime variability or assoc excess/ purulent sputum or mucus plugs or hemoptysis or cp or chest tightness, subjective wheeze    Also denies any obvious fluctuation of symptoms with weather or environmental changes or other aggravating or alleviating factors except as outlined above   No unusual exposure hx or h/o childhood pna/ asthma or knowledge of premature birth.  Current Allergies, Complete Past Medical History, Past Surgical History, Family History, and Social History were reviewed in [employer] record.  ROS  The following are not active complaints unless bolded Hoarseness, sore throat, dysphagia, dental problems, itching, sneezing,  nasal congestion or  discharge of excess mucus or purulent secretions, ear ache,   fever, chills, sweats, unintended wt loss or wt gain, classically pleuritic or exertional cp,  orthopnea pnd or arm/hand swelling  or leg swelling, presyncope, palpitations, abdominal pain, anorexia, nausea, vomiting, diarrhea  or change in bowel habits or change in bladder habits, change in stools or change in urine, dysuria, hematuria,  rash, arthralgias, visual complaints, headache, numbness, weakness or ataxia or problems with walking or coordination,  change in mood or  memory.           Past Medical History:  Diagnosis Date  . GERD (gastroesophageal reflux disease)   . H/O neck surgery    ED records from 07/26/2020  . Hypertension   . Skin cancer     Outpatient Medications Prior to Visit  Medication Sig Dispense Refill  . albuterol (VENTOLIN HFA) 108 (90 Base) MCG/ACT inhaler Inhale 2 puffs into the lungs every 6 (six) hours as needed for wheezing or shortness of breath. 8 g 0  . Alcohol Swabs (ALCOHOL PREP) 70 % PADS One injection weekly. 100 each 1  . amLODipine (NORVASC) 10 MG tablet Take 1 tablet (10 mg total) by mouth daily. 30 tablet 2  . atorvastatin (LIPITOR) 20 MG tablet Take 1 tablet (20 mg total) by mouth daily. 90 tablet 0  . Blood Glucose Monitoring Suppl (TRUE METRIX METER) w/Device KIT Use as directed 1 kit 0  . Dulaglutide (TRULICITY) 0.10 UV/2.5DG SOPN Inject 0.75 mg  into the skin once a week. 3 mL 0  . glucose blood (TRUE METRIX BLOOD GLUCOSE TEST) test strip Use as instructed 100 each 12  . Insulin Pen Needle (BD PEN NEEDLE MICRO U/F) 32G X 6 MM MISC One injection weekly. 100 each 1  . lisinopril (ZESTRIL) 40 MG tablet Take 1 tablet (40 mg total) by mouth daily. 30 tablet 0  . pantoprazole (PROTONIX) 40 MG tablet Take 1 tablet (40 mg total) by mouth daily. 30 tablet 1  . traZODone (DESYREL) 50 MG tablet Take 1 tablet (50 mg total) by mouth at bedtime. 30 tablet 0  . TRUEplus Lancets 28G MISC Use as  directed 100 each 4   No facility-administered medications prior to visit.     Objective:     BP 124/80 (BP Location: Left Arm, Cuff Size: Normal)   Pulse 98   Temp (!) 97.5 F (36.4 C) (Temporal)   Ht _0  (1.727 m)   Wt 259 lb (117.5 kg)   SpO2 98% Comment: on RA  BMI 39.38 kg/m   SpO2: 98 % (on RA)   amb pleasant mod obese bf dry cough    HEENT : pt wearing mask not removed for exam due to covid -19 concerns.    NECK :  without JVD/Nodes/TM/ nl carotid upstrokes bilaterally   LUNGS: no acc muscle use,  Nl contour chest which is clear to A and P bilaterally without cough on insp or exp maneuvers   CV:  RRR  no s3 or murmur or increase in P2, and no edema   ABD:  soft and nontender with nl inspiratory excursion in the supine position. No bruits or organomegaly appreciated, bowel sounds nl  MS:  Nl gait/ ext warm without deformities, calf tenderness, cyanosis or clubbing No obvious joint restrictions   SKIN: warm and dry without lesions    NEURO:  alert, approp, nl sensorium with  no motor or cerebellar deficits apparent.    I personally reviewed images and agree with radiology impression as follows:  CXR:    10/28/20 Diffuseairways thickening with some patchy right infrahilar  opacities which may be somewhat accentuated by overlying breast  tissue and body habitus. Findings are suspicious for developing  pneumonia in the appropriate clinical setting   CXR PA and Lateral:   11/08/2020 :    I personally reviewed images and agree with radiology impression as follows:    Negative for acute cardiopulmonary disease  Assessment   Upper airway cough syndrome Flared Winter 2022 while on ACEi > d/c'd 11/08/2020 and max gerd rx   Upper airway cough syndrome (previously labeled PNDS),  is so named because it's frequently impossible to sort out how much is  CR/sinusitis with freq throat clearing (which can be related to primary GERD)   vs  causing  secondary (" extra  esophageal")  GERD from wide swings in gastric pressure that occur with throat clearing, often  promoting self use of mint and menthol lozenges that reduce the lower esophageal sphincter tone and exacerbate the problem further in a cyclical fashion.   These are the same pts (now being labeled as having "irritable larynx syndrome" by some cough centers) who not infrequently have a history of having failed to tolerate ace inhibitors,  dry powder inhalers or biphosphonates or report having atypical/extraesophageal reflux symptoms that don't respond to standard doses of PPI  and are easily confused as having aecopd or asthma flares by even experienced allergists/ pulmonologists (myself included).  Rec:  Max gerd rx/ off acei and return in not all better in 6 weeks     Essential hypertension D/c acei 11/08/2020 due to refractory cough   In the best review of chronic cough to date ( NEJM 2016 375 2178-3754) ,  ACEi are now felt to cause cough in up to  20% of pts which is a 4 fold increase from previous reports and does not include the variety of non-specific complaints we see in pulmonary clinic in pts on ACEi but previously attributed to another dx like  Copd/asthma and  include PNDS, "throat and chest congestion", "bronchitis", unexplained dyspnea and noct "strangling" sensations, and hoarseness, but also  atypical /refractory GERD symptoms like dysphagia and "bad heartburn"   The only way I know  to prove this is not an "ACEi Case" is a trial off ACEi x a minimum of 6 weeks then regroup.   rec benicare 40 mg daily and f/u with PCP if better, if not, return here.    Cigarette smoker Suggested e-cigs as an optional  "one way bridge"  Off all tobacco products         Each maintenance medication was reviewed in detail including emphasizing most importantly the difference between maintenance and prns and under what circumstances the prns are to be triggered using an action plan format where  appropriate.  Total time for H and P, chart review, counseling,   and generating customized AVS unique to this office visit / same day charting = 52 min                 Christinia Gully, MD 11/08/2020

## 2020-11-08 NOTE — Patient Instructions (Signed)
Depomedrol 120 mg IM   The key is to stop smoking completely before smoking completely stops you!  Ok to use e cigarettes as a one way bridge off of all cigarettes.  Stop lisinopril and take olmesartan 40 mg one daily in its place - call me with any issues   mucinex dm 1200 mg every 12 hours with water as needed for cough / congestion.  Pantoprazole (protonix) 40 mg   Take  30-60 min before first meal of the day and Pepcid (famotidine)  20 mg one one after supper until return to office - this is the best way to tell whether stomach acid is contributing to your problem.     GERD (REFLUX)  is an extremely common cause of respiratory symptoms just like yours , many times with no obvious heartburn at all.    It can be treated with medication, but also with lifestyle changes including elevation of the head of your bed (ideally with 6 -8inch blocks under the headboard of your bed),  Smoking cessation, avoidance of late meals, excessive alcohol, and avoid fatty foods, chocolate, peppermint, colas, red wine, and acidic juices such as orange juice.  NO MINT OR MENTHOL PRODUCTS SO NO COUGH DROPS  USE SUGARLESS CANDY INSTEAD (Jolley ranchers or Stover's or Life Savers) or even ice chips will also do - the key is to swallow to prevent all throat clearing. NO OIL BASED VITAMINS - use powdered substitutes.  Avoid fish oil when coughing.   Please remember to go to the  x-ray department  for your tests - we will call you with the results when they are available     If you are satisfied with your treatment plan,  let your doctor know and he/she can either refill your medications or you can return here when your prescription runs out.     If in any way you are not 100% satisfied,  please tell us.  If 100% better, tell your friends!  Pulmonary follow up is as needed

## 2020-11-08 NOTE — Assessment & Plan Note (Signed)
Flared Winter 2022 while on ACEi > d/c'd 11/08/2020 and max gerd rx   Upper airway cough syndrome (previously labeled PNDS),  is so named because it's frequently impossible to sort out how much is  CR/sinusitis with freq throat clearing (which can be related to primary GERD)   vs  causing  secondary (" extra esophageal")  GERD from wide swings in gastric pressure that occur with throat clearing, often  promoting self use of mint and menthol lozenges that reduce the lower esophageal sphincter tone and exacerbate the problem further in a cyclical fashion.   These are the same pts (now being labeled as having "irritable larynx syndrome" by some cough centers) who not infrequently have a history of having failed to tolerate ace inhibitors,  dry powder inhalers or biphosphonates or report having atypical/extraesophageal reflux symptoms that don't respond to standard doses of PPI  and are easily confused as having aecopd or asthma flares by even experienced allergists/ pulmonologists (myself included).   Rec:  Max gerd rx/ off acei and return in not all better in 6 weeks

## 2020-11-09 ENCOUNTER — Telehealth: Payer: Self-pay

## 2020-11-09 MED FILL — TRULICITY 0.75 MG/0.5 ML PE: 0.75 | 28 days supply | Qty: 2 | Fill #0

## 2020-11-09 NOTE — Telephone Encounter (Signed)
LVM

## 2020-11-12 NOTE — Telephone Encounter (Signed)
FMLA documentation completed and ready for patient pickup.

## 2020-11-14 ENCOUNTER — Encounter: Payer: Self-pay | Admitting: Family

## 2020-11-14 ENCOUNTER — Other Ambulatory Visit (HOSPITAL_BASED_OUTPATIENT_CLINIC_OR_DEPARTMENT_OTHER): Payer: Self-pay

## 2020-11-14 ENCOUNTER — Ambulatory Visit (INDEPENDENT_AMBULATORY_CARE_PROVIDER_SITE_OTHER): Payer: 59 | Admitting: Family

## 2020-11-14 ENCOUNTER — Other Ambulatory Visit: Payer: Self-pay

## 2020-11-14 ENCOUNTER — Encounter: Payer: Self-pay | Admitting: Gastroenterology

## 2020-11-14 VITALS — BP 125/82 | HR 96 | Ht 67.99 in | Wt 256.0 lb

## 2020-11-14 DIAGNOSIS — G4700 Insomnia, unspecified: Secondary | ICD-10-CM | POA: Diagnosis not present

## 2020-11-14 DIAGNOSIS — I10 Essential (primary) hypertension: Secondary | ICD-10-CM | POA: Diagnosis not present

## 2020-11-14 MED ORDER — OLMESARTAN MEDOXOMIL 40 MG PO TABS
40.0000 mg | ORAL_TABLET | Freq: Every day | ORAL | 2 refills | Status: DC
  Filled 2020-11-14 – 2020-12-07 (×2): qty 30, 30d supply, fill #0
  Filled 2021-01-22: qty 30, 30d supply, fill #1
  Filled 2021-02-26: qty 30, 30d supply, fill #2

## 2020-11-14 MED ORDER — AMLODIPINE BESYLATE 10 MG PO TABS
10.0000 mg | ORAL_TABLET | Freq: Every day | ORAL | 2 refills | Status: DC
  Filled 2020-11-14 – 2020-12-03 (×5): qty 30, 30d supply, fill #0

## 2020-11-14 NOTE — Progress Notes (Signed)
Patient ID: Sheri [name], female    DOB: 1975/09/07  MRN: [id number]  CC: Hypertension and Insomnia Follow-Up  Subjective: Sheri [name] is a 45 y.o. female who presents for hypertension and insomnia follow-up. Her concerns today include:   1. HYPERTENSION FOLLOW-UP: 10/17/2020:  - Increase Lisinopril from 30 mg daily to 40 mg daily.   Visit 11/08/2020 at [facility] per MD note: D/c acei 11/08/2020 due to refractory cough   In the best review of chronic cough to date ( NEJM 2016 375 7829-5621) ,  ACEi are now felt to cause cough in up to  20% of pts which is a 4 fold increase from previous reports and does not include the variety of non-specific complaints we see in pulmonary clinic in pts on ACEi but previously attributed to another dx like  Copd/asthma and  include PNDS, "throat and chest congestion", "bronchitis", unexplained dyspnea and noct "strangling" sensations, and hoarseness, but also  atypical /refractory GERD symptoms like dysphagia and "bad heartburn"   The only way I know  to prove this is not an "ACEi Case" is a trial off ACEi x a minimum of 6 weeks then regroup.   rec benicare 40 mg daily and f/u with PCP if better, if not, return here.   11/14/2020: Currently taking: see medication list Med Adherence: _0  Yes    _1  No Medication side effects: _2  Yes    _3  No Home Monitoring?: _4  Yes    _5  No Monitoring Frequency: _6  Yes    _7  No Home BP results range: _8  Yes, 120's/80's Smoking _9  Yes _10  No SOB? _11  Yes    _12  No Chest Pain?: _13  Yes    _14  No Comments: No longer having a cough   2. INSOMNIA FOLLOW-UP:  10/17/2020: - Will try course of Trazodone for insomnia.  11/14/2020: Feels low-dose of Trazodone not giving desired results. Sometimes taking 3 pills at once to feel desired effects.   Patient Active Problem List   Diagnosis Date Noted  . Upper airway cough syndrome 11/08/2020  . Cigarette smoker 11/08/2020  . Type 2 diabetes mellitus ([place])  10/18/2020  . Hyperlipidemia 10/18/2020  . Rectal bleeding 09/27/2020  . Essential hypertension 09/27/2020  . 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 Plantar fasciitis 03/03/2019     Current Outpatient Medications on File Prior to Visit  Medication Sig Dispense Refill  . albuterol (VENTOLIN HFA) 108 (90 Base) MCG/ACT inhaler Inhale 2 puffs into the lungs every 6 (six) hours as needed for wheezing or shortness of breath. 8 g 0  . Alcohol Swabs (ALCOHOL PREP) 70 % PADS One injection weekly. 100 each 1  . atorvastatin (LIPITOR) 20 MG tablet Take 1 tablet (20 mg total) by mouth daily. 90 tablet 0  . Blood Glucose Monitoring Suppl (TRUE METRIX METER) w/Device KIT Use as directed 1 kit 0  . Dulaglutide (TRULICITY) 3.08 MV/7.8IO SOPN Inject 0.75 mg into the skin once a week. 3 mL 0  . famotidine (PEPCID) 20 MG tablet One after supper 30 tablet 11  . glucose blood (TRUE METRIX BLOOD GLUCOSE TEST) test strip Use as instructed 100 each 12  . Insulin Pen Needle (BD PEN NEEDLE MICRO U/F) 32G X 6 MM MISC One injection weekly. 100 each 1  . pantoprazole (PROTONIX) 40 MG tablet Take 1 tablet (40 mg total) by mouth daily. 30 tablet 1  . TRUEplus Lancets 28G MISC Use as directed 100 each 4   No current facility-administered medications  on file prior to visit.    Allergies  Allergen Reactions  . Amoxicillin Hives and Rash  . Iodine Anaphylaxis and Swelling  . Penicillins Hives, Itching and Rash  . Erythromycin Swelling and Other (See Comments)  . Metformin Diarrhea    Social History   Socioeconomic History  . Marital status: Single    Spouse name: Not on file  . Number of children: Not on file  . Years of education: Not on file  . Highest education level: Not on file  Occupational History  . Not on file  Tobacco Use  . Smoking status: Current Every Day Smoker    Packs/day: 0.50    Years: 22.00    Pack years: 11.00    Types: Cigarettes  . Smokeless tobacco: Never Used  . Tobacco  comment: 4-5 cigs per day 11/08/20//lmr  Vaping Use  . Vaping Use: Not on file  Substance and Sexual Activity  . Alcohol use: Not Currently  . Drug use: Not Currently  . Sexual activity: Never    Birth control/protection: None  Other Topics Concern  . Not on file  Social History Narrative   ** Merged History Encounter **       Social Determinants of Health   Financial Resource Strain: Not on file  Food Insecurity: Not on file  Transportation Needs: Not on file  Physical Activity: Not on file  Stress: Not on file  Social Connections: Not on file  Intimate Partner Violence: Not on file    Family History  Problem Relation Age of Onset  . Hypertension Mother   . Diabetes Mother   . Diabetes Father   . Hypertension Father   . Colon cancer Maternal Grandfather   . Diabetes Paternal Grandmother   . Rectal cancer Neg Hx   . Stomach cancer Neg Hx   . Esophageal cancer Neg Hx     Past Surgical History:  Procedure Laterality Date  . BASAL CELL CARCINOMA EXCISION     ed records 07/26/2020  . CERVICAL DISC SURGERY      ROS: Review of Systems Negative except as stated above  PHYSICAL EXAM: Vitals with BMI 11/15/2020 11/14/2020 11/08/2020  Height - 5' 7.992" _0   Weight - 256 lbs 259 lbs  BMI - 96.22 29.79  Systolic 892 119 417  Diastolic 82 79 80  Pulse - 96 98    Physical Exam HENT:     Head: Normocephalic and atraumatic.  Eyes:     Extraocular Movements: Extraocular movements intact.     Conjunctiva/sclera: Conjunctivae normal.     Pupils: Pupils are equal, round, and reactive to light.  Cardiovascular:     Rate and Rhythm: Normal rate and regular rhythm.     Pulses: Normal pulses.     Heart sounds: Normal heart sounds.  Pulmonary:     Effort: Pulmonary effort is normal.     Breath sounds: Normal breath sounds.  Musculoskeletal:     Cervical back: Normal range of motion and neck supple.  Neurological:     Mental Status: She is alert.  Psychiatric:         Mood and Affect: Mood normal.        Behavior: Behavior normal.    ASSESSMENT AND PLAN: 1. Essential hypertension: - Blood pressure at goal today in office. Home blood pressure readings about the same as today's office reading.  - No longer having cough since Lisinopril-Hydrochlorothiazide discontinued.  - Continue Olmesartan and Amlodipine as prescribed.  -  Counseled on blood pressure goal of less than 130/80, low-sodium, DASH diet, medication compliance, 150 minutes of moderate intensity exercise per week as tolerated. Discussed medication compliance, adverse effects. - Follow-up with primary provider in 3 months or sooner if needed.  - olmesartan (BENICAR) 40 MG tablet; Take 1 tablet (40 mg total) by mouth daily.  Dispense: 30 tablet; Refill: 2 - amLODipine (NORVASC) 10 MG tablet; Take 1 tablet (10 mg total) by mouth daily.  Dispense: 30 tablet; Refill: 2  2. Insomnia, unspecified type: - Increase Trazodone and continue as prescribed.  - Follow-up with primary provider as scheduled.  - traZODone (DESYREL) 100 MG tablet; Take 1 tablet (100 mg total) by mouth at bedtime.  Dispense: 30 tablet; Refill: 1  Patient was given the opportunity to ask questions.  Patient verbalized understanding of the plan and was able to repeat key elements of the plan. Patient was given clear instructions to go to Emergency Department or return to medical center if symptoms don't improve, worsen, or new problems develop.The patient verbalized understanding.   Requested Prescriptions   Signed Prescriptions Disp Refills  . olmesartan (BENICAR) 40 MG tablet 30 tablet 2    Sig: Take 1 tablet (40 mg total) by mouth daily.  Marland Kitchen amLODipine (NORVASC) 10 MG tablet 30 tablet 2    Sig: Take 1 tablet (10 mg total) by mouth daily.  . traZODone (DESYREL) 100 MG tablet 30 tablet 1    Sig: Take 1 tablet (100 mg total) by mouth at bedtime.    Return in about 3 months (around 02/13/2021) for Follow-Up hypertension .  Camillia Herter, NP

## 2020-11-14 NOTE — Progress Notes (Signed)
HTN f/U

## 2020-11-16 ENCOUNTER — Other Ambulatory Visit (HOSPITAL_BASED_OUTPATIENT_CLINIC_OR_DEPARTMENT_OTHER): Payer: Self-pay

## 2020-11-16 ENCOUNTER — Telehealth: Payer: Self-pay | Admitting: Family

## 2020-11-16 MED ORDER — TRAZODONE HCL 100 MG PO TABS
100.0000 mg | ORAL_TABLET | Freq: Every day | ORAL | 1 refills | Status: DC
  Filled 2020-11-16: qty 30, 30d supply, fill #0

## 2020-11-16 NOTE — Telephone Encounter (Signed)
Pt called about traZODone (DESYREL) 50 MG tablet and states that the provider increased the dosage by 50MG . Which would be 100MG . I let the pt know that I did not see any notes in her chart about provider increasing dosage. Please follow up with pt and advise on next steps. If provider changes dosage for pt she would like it sent to this pharmacy: [facility]  [address], [place], [place] [address]

## 2020-11-19 ENCOUNTER — Other Ambulatory Visit: Payer: Self-pay

## 2020-11-19 ENCOUNTER — Other Ambulatory Visit (HOSPITAL_BASED_OUTPATIENT_CLINIC_OR_DEPARTMENT_OTHER): Payer: Self-pay

## 2020-11-19 DIAGNOSIS — G4700 Insomnia, unspecified: Secondary | ICD-10-CM

## 2020-11-19 MED ORDER — TRAZODONE HCL 100 MG PO TABS
100.0000 mg | ORAL_TABLET | Freq: Every day | ORAL | 1 refills | Status: DC
  Filled 2020-11-19 – 2020-12-20 (×3): qty 30, 30d supply, fill #0
  Filled 2021-01-21: qty 30, 30d supply, fill #1

## 2020-11-19 MED ORDER — TRAZODONE HCL 100 MG PO TABS: 100.0000 mg | ORAL_TABLET | Freq: Every day | ORAL | 1 refills | Status: DC

## 2020-11-19 NOTE — Progress Notes (Signed)
Trazadone 100 mg sent into [facility] pharmacy

## 2020-11-19 NOTE — Progress Notes (Signed)
Sent to [facility] 

## 2020-11-21 ENCOUNTER — Other Ambulatory Visit: Payer: Self-pay

## 2020-11-21 ENCOUNTER — Encounter (HOSPITAL_BASED_OUTPATIENT_CLINIC_OR_DEPARTMENT_OTHER): Payer: Self-pay

## 2020-11-21 ENCOUNTER — Other Ambulatory Visit (HOSPITAL_BASED_OUTPATIENT_CLINIC_OR_DEPARTMENT_OTHER): Payer: Self-pay

## 2020-11-21 ENCOUNTER — Emergency Department (HOSPITAL_BASED_OUTPATIENT_CLINIC_OR_DEPARTMENT_OTHER)
Admission: EM | Admit: 2020-11-21 | Discharge: 2020-11-21 | Disposition: A | Discharge status: Home / Self Care | Payer: 59 | Attending: Emergency Medicine | Admitting: Emergency Medicine

## 2020-11-21 DIAGNOSIS — E785 Hyperlipidemia, unspecified: Secondary | ICD-10-CM | POA: Insufficient documentation

## 2020-11-21 DIAGNOSIS — Z85828 Personal history of other malignant neoplasm of skin: Secondary | ICD-10-CM | POA: Diagnosis not present

## 2020-11-21 DIAGNOSIS — Z79899 Other long term (current) drug therapy: Secondary | ICD-10-CM | POA: Diagnosis not present

## 2020-11-21 DIAGNOSIS — Z7984 Long term (current) use of oral hypoglycemic drugs: Secondary | ICD-10-CM | POA: Insufficient documentation

## 2020-11-21 DIAGNOSIS — H53149 Visual discomfort, unspecified: Secondary | ICD-10-CM | POA: Insufficient documentation

## 2020-11-21 DIAGNOSIS — E1169 Type 2 diabetes mellitus with other specified complication: Secondary | ICD-10-CM | POA: Diagnosis not present

## 2020-11-21 DIAGNOSIS — H5789 Other specified disorders of eye and adnexa: Secondary | ICD-10-CM | POA: Insufficient documentation

## 2020-11-21 DIAGNOSIS — I10 Essential (primary) hypertension: Secondary | ICD-10-CM | POA: Insufficient documentation

## 2020-11-21 DIAGNOSIS — Z794 Long term (current) use of insulin: Secondary | ICD-10-CM | POA: Insufficient documentation

## 2020-11-21 DIAGNOSIS — R0981 Nasal congestion: Secondary | ICD-10-CM | POA: Insufficient documentation

## 2020-11-21 DIAGNOSIS — H538 Other visual disturbances: Secondary | ICD-10-CM | POA: Diagnosis not present

## 2020-11-21 DIAGNOSIS — F17210 Nicotine dependence, cigarettes, uncomplicated: Secondary | ICD-10-CM | POA: Diagnosis not present

## 2020-11-21 DIAGNOSIS — H01009 Unspecified blepharitis unspecified eye, unspecified eyelid: Secondary | ICD-10-CM | POA: Diagnosis not present

## 2020-11-21 DIAGNOSIS — H5711 Ocular pain, right eye: Secondary | ICD-10-CM | POA: Insufficient documentation

## 2020-11-21 MED ORDER — OXYCODONE-ACETAMINOPHEN 5-325 MG PO TABS
1.0000 | ORAL_TABLET | Freq: Once | ORAL | Status: AC
  Administered 2020-11-21: 1 via ORAL
  Filled 2020-11-21: qty 1

## 2020-11-21 MED ORDER — TOBRAMYCIN-DEXAMETHASONE 0.3-0.1 % OP SUSP
OPHTHALMIC | 0 refills | Status: DC
  Filled 2020-11-21: qty 5, 13d supply, fill #0

## 2020-11-21 MED ORDER — FLUORESCEIN SODIUM 1 MG OP STRP
1.0000 | ORAL_STRIP | Freq: Once | OPHTHALMIC | Status: AC
  Administered 2020-11-21: 1 via OPHTHALMIC
  Filled 2020-11-21: qty 1

## 2020-11-21 MED ORDER — TETRACAINE HCL 0.5 % OP SOLN
2.0000 [drp] | Freq: Once | OPHTHALMIC | Status: AC
  Administered 2020-11-21: 2 [drp] via OPHTHALMIC
  Filled 2020-11-21: qty 4

## 2020-11-21 NOTE — ED Triage Notes (Addendum)
Pt states woke up yesterday and right eye was red and painful. States now is sensitive to light and painful

## 2020-11-21 NOTE — ED Notes (Signed)
Pt states has prescription eyeglasses, but does not wear often.

## 2020-11-21 NOTE — ED Provider Notes (Signed)
[facility] EMERGENCY DEPARTMENT Provider Note   CSN: [id number] Arrival date & time: 11/21/20  1157     History Chief Complaint  Patient presents with  . Eye Pain    Sheri [name] is a 45 y.o. female.  HPI      45 year old female with a history of hypertension, hyperlipidemia and diabetes presents with concern for right eye pain and redness.  States she is having severe pain, which is worsened with light.  Reports of light shines into her left eye she also has pain in her right.  She woke up with redness in the eye yesterday, and has continued to have severe pain and light sensitivity.  She denies any injuries to the eye, abrasions, chemical exposure.  Denies any sick contacts.  Does report she does have a runny nose.  Describes an aching behind her eyes and around her eye as well.  No fevers.  She has been unable to open her eye much, and is difficult to tell if her vision is blurred, but she thinks it might be a little bit.  She denies any discharge.  She has eyeglasses that she infrequently wears.  No history of autoimmune disorder  Past Medical History:  Diagnosis Date  . GERD (gastroesophageal reflux disease)   . H/O neck surgery    ED records from 07/26/2020  . Hypertension   . Skin cancer     Patient Active Problem List   Diagnosis Date Noted  . Upper airway cough syndrome 11/08/2020  . Cigarette smoker 11/08/2020  . Type 2 diabetes mellitus ([place]) 10/18/2020  . Hyperlipidemia 10/18/2020  . Rectal bleeding 09/27/2020  . Essential hypertension 09/27/2020  . 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 Plantar fasciitis 03/03/2019    Past Surgical History:  Procedure Laterality Date  . BASAL CELL CARCINOMA EXCISION     ed records 07/26/2020  . CERVICAL DISC SURGERY       OB History   No obstetric history on file.     Family History  Problem Relation Age of Onset  . Hypertension Mother   . Diabetes Mother   . Diabetes Father   . Hypertension  Father   . Colon cancer Maternal Grandfather   . Diabetes Paternal Grandmother   . Rectal cancer Neg Hx   . Stomach cancer Neg Hx   . Esophageal cancer Neg Hx     Social History   Tobacco Use  . Smoking status: Current Some Day Smoker    Packs/day: 0.50    Years: 22.00    Pack years: 11.00    Types: Cigarettes  . Smokeless tobacco: Never Used  . Tobacco comment: 4-5 cigs per day 11/08/20//lmr  Vaping Use  . Vaping Use: Some days  Substance Use Topics  . Alcohol use: Yes    Comment: occasional  . Drug use: Not Currently    Home Medications Prior to Admission medications   Medication Sig Start Date End Date Taking? Authorizing Provider  albuterol (VENTOLIN HFA) 108 (90 Base) MCG/ACT inhaler Inhale 2 puffs into the lungs every 6 (six) hours as needed for wheezing or shortness of breath. 10/17/20   Camillia Herter, NP  Alcohol Swabs (ALCOHOL PREP) 70 % PADS One injection weekly. 10/18/20   Camillia Herter, NP  amLODipine (NORVASC) 10 MG tablet Take 1 tablet (10 mg total) by mouth daily. 11/14/20 02/12/21  Camillia Herter, NP  atorvastatin (LIPITOR) 20 MG tablet Take 1 tablet (20 mg total) by mouth  daily. 10/18/20   Camillia Herter, NP  Blood Glucose Monitoring Suppl (TRUE METRIX METER) w/Device KIT Use as directed 10/18/20   Camillia Herter, NP  Dulaglutide (TRULICITY) 2.48 GN/0.0BB SOPN Inject 0.75 mg into the skin once a week. 11/06/20 12/18/20  Camillia Herter, NP  famotidine (PEPCID) 20 MG tablet One after supper 11/08/20   Tanda Rockers, MD  glucose blood (TRUE METRIX BLOOD GLUCOSE TEST) test strip Use as instructed 10/18/20   Camillia Herter, NP  Insulin Pen Needle (BD PEN NEEDLE MICRO U/F) 32G X 6 MM MISC One injection weekly. 10/18/20   Camillia Herter, NP  olmesartan (BENICAR) 40 MG tablet Take 1 tablet (40 mg total) by mouth daily. 11/14/20   Camillia Herter, NP  pantoprazole (PROTONIX) 40 MG tablet Take 1 tablet (40 mg total) by mouth daily. 09/27/20   Noralyn Pick, NP   tobramycin-dexamethasone [facility]) ophthalmic solution instill 1 drop by ophthalmic route 4 times every day into both eyes. 11/21/20     traZODone (DESYREL) 100 MG tablet Take 1 tablet (100 mg total) by mouth at bedtime. 11/16/20 01/15/21  Camillia Herter, NP  traZODone (DESYREL) 100 MG tablet Take 1 tablet (100 mg total) by mouth at bedtime. 11/19/20 01/18/21  Camillia Herter, NP  TRUEplus Lancets 28G MISC Use as directed 10/18/20   Camillia Herter, NP    Allergies    Amoxicillin, Iodine, Penicillins, Erythromycin, and Metformin  Review of Systems   Review of Systems  Constitutional: Negative for fever.  HENT: Positive for congestion.   Eyes: Positive for photophobia, pain, redness and visual disturbance. Negative for discharge.  Respiratory: Negative for cough.   Cardiovascular: Negative for chest pain.  Gastrointestinal: Negative for nausea and vomiting.  Neurological: Positive for headaches (just behind eye). Negative for weakness and numbness.    Physical Exam Updated Vital Signs BP (!) 142/83 (BP Location: Right Arm)   Pulse 79   Temp 98.2 F (36.8 C) (Oral)   Resp 20   Ht $R'5\' 9"'yz$  (1.753 m)   Wt 114.3 kg   SpO2 100%   BMI 37.21 kg/m   Physical Exam Vitals and nursing note reviewed.  Constitutional:      General: She is not in acute distress.    Appearance: Normal appearance. She is not ill-appearing, toxic-appearing or diaphoretic.  HENT:     Head: Normocephalic.  Eyes:     General: Lids are normal.        Right eye: No foreign body or discharge.     Extraocular Movements: Extraocular movements intact.     Right eye: Normal extraocular motion.     Left eye: Normal extraocular motion.     Conjunctiva/sclera:     Right eye: Right conjunctiva is injected. No chemosis or exudate.    Pupils: Pupils are equal, round, and reactive to light.     Slit lamp exam:    Right eye: Photophobia present. No corneal ulcer or hyphema.     Left eye: Photophobia (with light to right eye)  present. No corneal ulcer or hyphema.     Comments: IOP 18 on right, L 25 although limited by blinking on L  Cardiovascular:     Rate and Rhythm: Normal rate and regular rhythm.     Pulses: Normal pulses.  Pulmonary:     Effort: Pulmonary effort is normal. No respiratory distress.  Musculoskeletal:        General: No deformity or signs of injury.  Cervical back: No rigidity.  Skin:    General: Skin is warm and dry.     Coloration: Skin is not jaundiced or pale.  Neurological:     General: No focal deficit present.     Mental Status: She is alert and oriented to person, place, and time.     ED Results / Procedures / Treatments   Labs (all labs ordered are listed, but only abnormal results are displayed) Labs Reviewed - No data to display  EKG None  Radiology No results found.  Procedures Procedures   Medications Ordered in ED Medications  fluorescein ophthalmic strip 1 strip (1 strip Right Eye Given 11/21/20 1311)  tetracaine (PONTOCAINE) 0.5 % ophthalmic solution 2 drop (2 drops Right Eye Given 11/21/20 1311)  oxyCODONE-acetaminophen (PERCOCET/ROXICET) 5-325 MG per tablet 1 tablet (1 tablet Oral Given 11/21/20 1443)      Visual Acuity  Right Eye Distance: 20/25 Left Eye Distance: 20/25 Bilateral Distance: 20/25  Right Eye Near:   Left Eye Near:    Bilateral Near:     ED Course  I have reviewed the triage vital signs and the nursing notes.  Pertinent labs & imaging results that were available during my care of the patient were reviewed by me and considered in my medical decision making (see chart for details).    MDM Rules/Calculators/A&P                           45 year old female with a history of hypertension, hyperlipidemia and diabetes presents with concern for right eye pain and redness.  Intraocular pressures are normal and do not suspect glaucoma.  She has no evidence of corneal abrasion on exam.  She did not get relief with tetracaine.  Do not see  signs of periorbital or orbital cellulitis.  Consider optic neuritis however does have conjunctival injection. Conjunctivitis is possible, however given severe pain, photophobia, no relief with tetracaine, feel iritis is a likely possibility based off of history and physical exam.   Discussed with ophthalmologist on-call Dr. Stacie Glaze who will see her in the office and recommends she come directly to the office for care.   Final Clinical Impression(s) / ED Diagnoses Final diagnoses:  Acute right eye pain    Rx / DC Orders ED Discharge Orders    None       Gareth Morgan, MD 11/22/20 [phone]

## 2020-11-23 ENCOUNTER — Other Ambulatory Visit: Payer: Self-pay | Admitting: Family

## 2020-11-23 ENCOUNTER — Other Ambulatory Visit (HOSPITAL_BASED_OUTPATIENT_CLINIC_OR_DEPARTMENT_OTHER): Payer: Self-pay

## 2020-11-23 DIAGNOSIS — E785 Hyperlipidemia, unspecified: Secondary | ICD-10-CM

## 2020-11-26 ENCOUNTER — Other Ambulatory Visit (HOSPITAL_BASED_OUTPATIENT_CLINIC_OR_DEPARTMENT_OTHER): Payer: Self-pay

## 2020-11-26 MED ORDER — TOBRAMYCIN-DEXAMETHASONE 0.3-0.1 % OP SUSP
OPHTHALMIC | 0 refills | Status: DC
  Filled 2020-11-26: qty 5, 7d supply, fill #0
  Filled 2020-11-29: qty 5, 12d supply, fill #0
  Filled ????-??-??: fill #0

## 2020-11-26 MED ORDER — ATORVASTATIN CALCIUM 20 MG PO TABS: 20.0000 mg | ORAL_TABLET | Freq: Every day | ORAL | 0 refills | Status: DC

## 2020-11-27 ENCOUNTER — Other Ambulatory Visit: Payer: Self-pay | Admitting: Family

## 2020-11-27 DIAGNOSIS — E119 Type 2 diabetes mellitus without complications: Secondary | ICD-10-CM

## 2020-11-28 ENCOUNTER — Other Ambulatory Visit (HOSPITAL_BASED_OUTPATIENT_CLINIC_OR_DEPARTMENT_OTHER): Payer: Self-pay

## 2020-11-28 MED ORDER — TRULICITY 0.75 MG/0.5ML ~~LOC~~ SOAJ
0.7500 mg | SUBCUTANEOUS | 1 refills | Status: DC
  Filled 2020-11-28 – 2020-11-29 (×2): qty 3, 42d supply, fill #0
  Filled 2020-12-03 – 2020-12-07 (×2): qty 2, 28d supply, fill #0
  Filled 2021-01-03: qty 2, 28d supply, fill #1
  Filled 2021-01-30: qty 2, 28d supply, fill #2

## 2020-11-29 ENCOUNTER — Other Ambulatory Visit (HOSPITAL_BASED_OUTPATIENT_CLINIC_OR_DEPARTMENT_OTHER): Payer: Self-pay

## 2020-11-29 MED ORDER — ATORVASTATIN CALCIUM 20 MG PO TABS
ORAL_TABLET | ORAL | 0 refills | Status: DC
  Filled 2020-11-29: qty 30, 30d supply, fill #0
  Filled 2020-12-07: qty 60, 60d supply, fill #0

## 2020-11-30 ENCOUNTER — Other Ambulatory Visit (HOSPITAL_BASED_OUTPATIENT_CLINIC_OR_DEPARTMENT_OTHER): Payer: Self-pay

## 2020-12-03 ENCOUNTER — Other Ambulatory Visit (HOSPITAL_BASED_OUTPATIENT_CLINIC_OR_DEPARTMENT_OTHER): Payer: Self-pay

## 2020-12-04 ENCOUNTER — Other Ambulatory Visit (HOSPITAL_BASED_OUTPATIENT_CLINIC_OR_DEPARTMENT_OTHER): Payer: Self-pay

## 2020-12-06 ENCOUNTER — Other Ambulatory Visit (HOSPITAL_BASED_OUTPATIENT_CLINIC_OR_DEPARTMENT_OTHER): Payer: Self-pay

## 2020-12-06 ENCOUNTER — Ambulatory Visit (HOSPITAL_BASED_OUTPATIENT_CLINIC_OR_DEPARTMENT_OTHER): Payer: 59 | Admitting: Obstetrics & Gynecology

## 2020-12-07 ENCOUNTER — Other Ambulatory Visit: Payer: Self-pay | Admitting: Family

## 2020-12-07 ENCOUNTER — Other Ambulatory Visit (HOSPITAL_BASED_OUTPATIENT_CLINIC_OR_DEPARTMENT_OTHER): Payer: Self-pay

## 2020-12-07 DIAGNOSIS — I10 Essential (primary) hypertension: Secondary | ICD-10-CM

## 2020-12-07 MED ORDER — AMLODIPINE BESYLATE 10 MG PO TABS
10.0000 mg | ORAL_TABLET | Freq: Every day | ORAL | 2 refills | Status: DC
  Filled 2020-12-07: qty 30, 30d supply, fill #0
  Filled 2021-01-22: qty 30, 30d supply, fill #1
  Filled 2021-02-26: qty 30, 30d supply, fill #2

## 2020-12-12 ENCOUNTER — Other Ambulatory Visit (HOSPITAL_BASED_OUTPATIENT_CLINIC_OR_DEPARTMENT_OTHER): Payer: Self-pay

## 2020-12-20 ENCOUNTER — Other Ambulatory Visit (HOSPITAL_BASED_OUTPATIENT_CLINIC_OR_DEPARTMENT_OTHER): Payer: Self-pay

## 2020-12-20 ENCOUNTER — Encounter: Payer: Self-pay | Admitting: Nurse Practitioner

## 2020-12-20 ENCOUNTER — Ambulatory Visit: Payer: 59 | Admitting: Nurse Practitioner

## 2020-12-20 ENCOUNTER — Other Ambulatory Visit: Payer: Self-pay

## 2020-12-20 ENCOUNTER — Ambulatory Visit (INDEPENDENT_AMBULATORY_CARE_PROVIDER_SITE_OTHER): Payer: 59 | Admitting: Nurse Practitioner

## 2020-12-20 VITALS — BP 148/81 | HR 88 | Temp 97.7°F | Resp 16 | Ht 67.99 in | Wt 257.4 lb

## 2020-12-20 DIAGNOSIS — M5432 Sciatica, left side: Secondary | ICD-10-CM

## 2020-12-20 DIAGNOSIS — G629 Polyneuropathy, unspecified: Secondary | ICD-10-CM | POA: Diagnosis not present

## 2020-12-20 DIAGNOSIS — E119 Type 2 diabetes mellitus without complications: Secondary | ICD-10-CM

## 2020-12-20 DIAGNOSIS — M5431 Sciatica, right side: Secondary | ICD-10-CM | POA: Diagnosis not present

## 2020-12-20 MED ORDER — GABAPENTIN 100 MG PO CAPS
100.0000 mg | ORAL_CAPSULE | Freq: Three times a day (TID) | ORAL | 0 refills | Status: DC
  Filled 2020-12-20: qty 90, 30d supply, fill #0

## 2020-12-20 NOTE — Patient Instructions (Signed)
Neuropathy - with diabetes Sciatica:  May start stretching exercises  Alterate heat and cold compresses  Will order gabapentin  Will place referral to neurology   Follow up:  Follow up with PCP in 1 month or sooner if needed   Neuropathic Pain Neuropathic pain is pain caused by damage to the nerves that are responsible for certain sensations in your body (sensory nerves). The pain can be caused by:  Damage to the sensory nerves that send signals to your spinal cord and brain (peripheral nervous system).  Damage to the sensory nerves in your brain or spinal cord (central nervous system). Neuropathic pain can make you more sensitive to pain. Even a minor sensation can feel very painful. This is usually a long-term condition that can be difficult to treat. The type of pain differs from person to person. It may:  Start suddenly (acute), or it may develop slowly and last for a long time (chronic).  Come and go as damaged nerves heal, or it may stay at the same level for years.  Cause emotional distress, loss of sleep, and a lower quality of life. What are the causes? The most common cause of this condition is diabetes. Many other diseases and conditions can also cause neuropathic pain. Causes of neuropathic pain can be classified as:  Toxic. This is caused by medicines and chemicals. The most common cause of toxic neuropathic pain is damage from cancer treatments (chemotherapy).  Metabolic. This can be caused by: ? Diabetes. This is the most common disease that damages the nerves. ? Lack of vitamin B from long-term alcohol abuse.  Traumatic. Any injury that cuts, crushes, or stretches a nerve can cause damage and pain. A common example is feeling pain after losing an arm or leg (phantom limb pain).  Compression-related. If a sensory nerve gets trapped or compressed for a long period of time, the blood supply to the nerve can be cut off.  Vascular. Many blood vessel diseases can  cause neuropathic pain by decreasing blood supply and oxygen to nerves.  Autoimmune. This type of pain results from diseases in which the body's defense system (immune system) mistakenly attacks sensory nerves. Examples of autoimmune diseases that can cause neuropathic pain include lupus and multiple sclerosis.  Infectious. Many types of viral infections can damage sensory nerves and cause pain. Shingles infection is a common cause of this type of pain.  Inherited. Neuropathic pain can be a symptom of many diseases that are passed down through families (genetic). What increases the risk? You are more likely to develop this condition if:  You have diabetes.  You smoke.  You drink too much alcohol.  You are taking certain medicines, including medicines that kill cancer cells (chemotherapy) or that treat immune system disorders. What are the signs or symptoms? The main symptom is pain. Neuropathic pain is often described as:  Burning.  Shock-like.  Stinging.  Hot or cold.  Itching. How is this diagnosed? No single test can diagnose neuropathic pain. It is diagnosed based on:  Physical exam and your symptoms. Your health care provider will ask you about your pain. You may be asked to use a pain scale to describe how bad your pain is.  Tests. These may be done to see if you have a high sensitivity to pain and to help find the cause and location of any sensory nerve damage. They include: ? Nerve conduction studies to test how well nerve signals travel through your sensory nerves (electrodiagnostic testing). ?  Stimulating your sensory nerves through electrodes on your skin and measuring the response in your spinal cord and brain (somatosensory evoked potential).  Imaging studies, such as: ? X-rays. ? CT scan. ? MRI. How is this treated? Treatment for neuropathic pain may change over time. You may need to try different treatment options or a combination of treatments. Some options  include:  Treating the underlying cause of the neuropathy, such as diabetes, kidney disease, or vitamin deficiencies.  Stopping medicines that can cause neuropathy, such as chemotherapy.  Medicine to relieve pain. Medicines may include: ? Prescription or over-the-counter pain medicine. ? Anti-seizure medicine. ? Antidepressant medicines. ? Pain-relieving patches that are applied to painful areas of skin. ? A medicine to numb the area (local anesthetic), which can be injected as a nerve block.  Transcutaneous nerve stimulation. This uses electrical currents to block painful nerve signals. The treatment is painless.  Alternative treatments, such as: ? Acupuncture. ? Meditation. ? Massage. ? Physical therapy. ? Pain management programs. ? Counseling. Follow these instructions at home: Medicines  Take over-the-counter and prescription medicines only as told by your health care provider.  Do not drive or use heavy machinery while taking prescription pain medicine.  If you are taking prescription pain medicine, take actions to prevent or treat constipation. Your health care provider may recommend that you: ? Drink enough fluid to keep your urine pale yellow. ? Eat foods that are high in fiber, such as fresh fruits and vegetables, whole grains, and beans. ? Limit foods that are high in fat and processed sugars, such as fried or sweet foods. ? Take an over-the-counter or prescription medicine for constipation.   Lifestyle  Have a good support system at home.  Consider joining a chronic pain support group.  Do not use any products that contain nicotine or tobacco, such as cigarettes and e-cigarettes. If you need help quitting, ask your health care provider.  Do not drink alcohol.   General instructions  Learn as much as you can about your condition.  Work closely with all your health care providers to find the treatment plan that works best for you.  Ask your health care  provider what activities are safe for you.  Keep all follow-up visits as told by your health care provider. This is important. Contact a health care provider if:  Your pain treatments are not working.  You are having side effects from your medicines.  You are struggling with tiredness (fatigue), mood changes, depression, or anxiety. Summary  Neuropathic pain is pain caused by damage to the nerves that are responsible for certain sensations in your body (sensory nerves).  Neuropathic pain may come and go as damaged nerves heal, or it may stay at the same level for years.  Neuropathic pain is usually a long-term condition that can be difficult to treat. Consider joining a chronic pain support group. This information is not intended to replace advice given to you by your health care provider. Make sure you discuss any questions you have with your health care provider. Document Revised: 11/18/2018 Document Reviewed: 08/14/2017 Elsevier Patient Education  2021 [employer].  Sciatica  Sciatica is pain, weakness, tingling, or loss of feeling (numbness) along the sciatic nerve. The sciatic nerve starts in the lower back and goes down the back of each leg. Sciatica usually goes away on its own or with treatment. Sometimes, sciatica may come back (recur). What are the causes? This condition happens when the sciatic nerve is pinched or has  pressure put on it. This may be the result of:  A disk in between the bones of the spine bulging out too far (herniated disk).  Changes in the spinal disks that occur with aging.  A condition that affects a muscle in the butt.  Extra bone growth near the sciatic nerve.  A break (fracture) of the area between your hip bones (pelvis).  Pregnancy.  Tumor. This is rare. What increases the risk? You are more likely to develop this condition if you:  Play sports that put pressure or stress on the spine.  Have poor strength and ease of movement  (flexibility).  Have had a back injury in the past.  Have had back surgery.  Sit for long periods of time.  Do activities that involve bending or lifting over and over again.  Are very overweight (obese). What are the signs or symptoms? Symptoms can vary from mild to very bad. They may include:  Any of these problems in the lower back, leg, hip, or butt: ? Mild tingling, loss of feeling, or dull aches. ? Burning sensations. ? Sharp pains.  Loss of feeling in the back of the calf or the sole of the foot.  Leg weakness.  Very bad back pain that makes it hard to move. These symptoms may get worse when you cough, sneeze, or laugh. They may also get worse when you sit or stand for long periods of time. How is this treated? This condition often gets better without any treatment. However, treatment may include:  Changing or cutting back on physical activity when you have pain.  Doing exercises and stretching.  Putting ice or heat on the affected area.  Medicines that help: ? To relieve pain and swelling. ? To relax your muscles.  Shots (injections) of medicines that help to relieve pain, irritation, and swelling.  Surgery. Follow these instructions at home: Medicines  Take over-the-counter and prescription medicines only as told by your doctor.  Ask your doctor if the medicine prescribed to you: ? Requires you to avoid driving or using heavy machinery. ? Can cause trouble pooping (constipation). You may need to take these steps to prevent or treat trouble pooping:  Drink enough fluids to keep your pee (urine) pale yellow.  Take over-the-counter or prescription medicines.  Eat foods that are high in fiber. These include beans, whole grains, and fresh fruits and vegetables.  Limit foods that are high in fat and sugar. These include fried or sweet foods. Managing pain  If told, put ice on the affected area. ? Put ice in a plastic bag. ? Place a towel between your  skin and the bag. ? Leave the ice on for 20 minutes, 2-3 times a day.  If told, put heat on the affected area. Use the heat source that your doctor tells you to use, such as a moist heat pack or a heating pad. ? Place a towel between your skin and the heat source. ? Leave the heat on for 20-30 minutes. ? Remove the heat if your skin turns bright red. This is very important if you are unable to feel pain, heat, or cold. You may have a greater risk of getting burned.      Activity  Return to your normal activities as told by your doctor. Ask your doctor what activities are safe for you.  Avoid activities that make your symptoms worse.  Take short rests during the day. ? When you rest for a long time, do  some physical activity or stretching between periods of rest. ? Avoid sitting for a long time without moving. Get up and move around at least one time each hour.  Exercise and stretch regularly, as told by your doctor.  Do not lift anything that is heavier than 10 lb (4.5 kg) while you have symptoms of sciatica. ? Avoid lifting heavy things even when you do not have symptoms. ? Avoid lifting heavy things over and over.  When you lift objects, always lift in a way that is safe for your body. To do this, you should: ? Bend your knees. ? Keep the object close to your body. ? Avoid twisting.   General instructions  Stay at a healthy weight.  Wear comfortable shoes that support your feet. Avoid wearing high heels.  Avoid sleeping on a mattress that is too soft or too hard. You might have less pain if you sleep on a mattress that is firm enough to support your back.  Keep all follow-up visits as told by your doctor. This is important. Contact a doctor if:  You have pain that: ? Wakes you up when you are sleeping. ? Gets worse when you lie down. ? Is worse than the pain you have had in the past. ? Lasts longer than 4 weeks.  You lose weight without trying. Get help right away  if:  You cannot control when you pee (urinate) or poop (have a bowel movement).  You have weakness in any of these areas and it gets worse: ? Lower back. ? The area between your hip bones. ? Butt. ? Legs.  You have redness or swelling of your back.  You have a burning feeling when you pee. Summary  Sciatica is pain, weakness, tingling, or loss of feeling (numbness) along the sciatic nerve.  This condition happens when the sciatic nerve is pinched or has pressure put on it.  Sciatica can cause pain, tingling, or loss of feeling (numbness) in the lower back, legs, hips, and butt.  Treatment often includes rest, exercise, medicines, and putting ice or heat on the affected area. This information is not intended to replace advice given to you by your health care provider. Make sure you discuss any questions you have with your health care provider. Document Revised: 08/16/2018 Document Reviewed: 08/16/2018 Elsevier Patient Education  [place].

## 2020-12-20 NOTE — Progress Notes (Signed)
_0  ID: Sheri [name], female    DOB: 09-14-1975, 45 y.o.   MRN: [id number]  Chief Complaint  Patient presents with  . Numbness    BILATERAL LEGS     Referring provider: Camillia Herter, NP  HPI  Patient presents today for an acute visit for bilateral leg pain.  Patient describes the pain as burning and numbness traveling down both legs.  She states that also radiates into her feet.  Patient has previously tried a round of prednisone that did not help.  She takes a BC powder and Tylenol for the pain.  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  She does notice swelling to her feet at times.  Patient does have a history of diabetes and is concerned for neuropathy. Denies f/c/s, n/v/d, hemoptysis, PND, chest pain or edema.      Allergies  Allergen Reactions  . Amoxicillin Hives and Rash  . Iodine Anaphylaxis and Swelling  . Penicillins Hives, Itching and Rash  . Erythromycin Swelling and Other (See Comments)  . Metformin Diarrhea    Immunization History  Administered Date(s) Administered  . Influenza-Unspecified 06/08/2020    Past Medical History:  Diagnosis Date  . GERD (gastroesophageal reflux disease)   . H/O neck surgery    ED records from 07/26/2020  . Hypertension   . Skin cancer     Tobacco History: Social History   Tobacco Use  Smoking Status Current Some Day Smoker  . Packs/day: 0.50  . Years: 22.00  . Pack years: 11.00  . Types: Cigarettes  Smokeless Tobacco Never Used  Tobacco Comment   4-5 cigs per day 11/08/20//lmr   Ready to quit: Not Answered Counseling given: Not Answered Comment: 4-5 cigs per day 11/08/20//lmr   Outpatient Encounter Medications as of 12/20/2020  Medication Sig  . albuterol (VENTOLIN HFA) 108 (90 Base) MCG/ACT inhaler Inhale 2 puffs into the lungs every 6 (six) hours as needed for wheezing or shortness of breath.  . Alcohol Swabs (ALCOHOL PREP) 70 % PADS One  injection weekly.  Marland Kitchen amLODipine (NORVASC) 10 MG tablet Take 1 tablet (10 mg total) by mouth daily.  Marland Kitchen atorvastatin (LIPITOR) 20 MG tablet Take one tablet by mouth once daily  . Blood Glucose Monitoring Suppl (TRUE METRIX METER) w/Device KIT Use as directed  . Dulaglutide (TRULICITY) 2.87 OM/7.6HM SOPN Inject 0.75 mg into the skin once a week.  . Dulaglutide 0.75 MG/0.5ML SOPN INJECT 0.75 MG UNDER THE SKIN ONCE WEEKLY  . famotidine (PEPCID) 20 MG tablet One after supper  . gabapentin (NEURONTIN) 100 MG capsule Take 1 capsule (100 mg total) by mouth 3 (three) times daily.  Marland Kitchen glucose blood (TRUE METRIX BLOOD GLUCOSE TEST) test strip Use as instructed  . Insulin Pen Needle (BD PEN NEEDLE MICRO U/F) 32G X 6 MM MISC One injection weekly.  Marland Kitchen olmesartan (BENICAR) 40 MG tablet Take 1 tablet (40 mg total) by mouth daily.  . pantoprazole (PROTONIX) 40 MG tablet Take 1 tablet (40 mg total) by mouth daily.  Marland Kitchen tobramycin-dexamethasone (TOBRADEX) ophthalmic solution Instill 1 drop into each eye 4 times every day  . traZODone (DESYREL) 100 MG tablet Take 1 tablet (100 mg total) by mouth at bedtime.  . TRUEplus Lancets 28G MISC Use as directed  . [DISCONTINUED] atorvastatin (LIPITOR) 20 MG tablet Take 1 tablet (20 mg total) by mouth daily.  . [DISCONTINUED] traZODone (DESYREL) 100 MG tablet Take 1 tablet (  100 mg total) by mouth at bedtime.   No facility-administered encounter medications on file as of 12/20/2020.     Review of Systems  Review of Systems  Constitutional: Negative.  Negative for fatigue and fever.  HENT: Negative.   Respiratory: Negative for cough and shortness of breath.   Cardiovascular: Negative.   Gastrointestinal: Negative.   Musculoskeletal: Positive for myalgias.       Pain, burning and numbness traveling down bilateral legs  Allergic/Immunologic: Negative.   Neurological: Negative.   Psychiatric/Behavioral: Negative.        Physical Exam  BP (!) 148/81 (BP Location: Left  Arm, Patient Position: Sitting, Cuff Size: Normal)   Pulse 88   Temp 97.7 F (36.5 C)   Resp 16   Ht 5' 7.99" (1.727 m)   Wt 257 lb 6.4 oz (116.8 kg)   SpO2 97%   BMI 39.15 kg/m   Wt Readings from Last 5 Encounters:  12/20/20 257 lb 6.4 oz (116.8 kg)  11/21/20 252 lb (114.3 kg)  11/14/20 256 lb (116.1 kg)  11/08/20 259 lb (117.5 kg)  11/07/20 262 lb (118.8 kg)     Physical Exam   Lab Results:  CBC    Component Value Date/Time   WBC 11.8 (H) 10/17/2020 1151   RBC 5.08 10/17/2020 1151   HGB 14.7 10/17/2020 1151   HCT 43.8 10/17/2020 1151   PLT 304 10/17/2020 1151   MCV 86 10/17/2020 1151   MCH 28.9 10/17/2020 1151   MCHC 33.6 10/17/2020 1151   RDW 13.1 10/17/2020 1151    BMET    Component Value Date/Time   NA 140 10/01/2020 1500   K 4.2 10/01/2020 1500   CL 101 10/01/2020 1500   CO2 23 10/01/2020 1500   GLUCOSE 107 (H) 10/01/2020 1500   BUN 12 10/01/2020 1500   CREATININE 0.78 10/01/2020 1500   CALCIUM 9.7 10/01/2020 1500   GFRNONAA 93 10/01/2020 1500   GFRAA 107 10/01/2020 1500    BNP No results found for: BNP  ProBNP No results found for: PROBNP  Imaging: No results found.   Assessment & Plan:   Type 2 diabetes mellitus (HCC) Neuropathy - with diabetes Sciatica:  May start stretching exercises  Alterate heat and cold compresses  Will order gabapentin  Will place referral to neurology   Follow up:  Follow up with PCP in 1 month or sooner if needed      Fenton Foy, NP 01/02/2021

## 2020-12-20 NOTE — Progress Notes (Signed)
Pt experiencing bilateral numbness and pain in legs for about 2 months, pt job requires her to stand on feet constantly Burning sensation starts in back and runs down legs  Legs are warm to touch when flare begins

## 2021-01-02 ENCOUNTER — Encounter: Payer: Self-pay | Admitting: Nurse Practitioner

## 2021-01-02 DIAGNOSIS — M5432 Sciatica, left side: Secondary | ICD-10-CM | POA: Insufficient documentation

## 2021-01-02 DIAGNOSIS — G629 Polyneuropathy, unspecified: Secondary | ICD-10-CM | POA: Insufficient documentation

## 2021-01-02 DIAGNOSIS — M5431 Sciatica, right side: Secondary | ICD-10-CM | POA: Insufficient documentation

## 2021-01-02 NOTE — Assessment & Plan Note (Signed)
Neuropathy - with diabetes Sciatica:  May start stretching exercises  Alterate heat and cold compresses  Will order gabapentin  Will place referral to neurology   Follow up:  Follow up with PCP in 1 month or sooner if needed

## 2021-01-03 ENCOUNTER — Other Ambulatory Visit (HOSPITAL_BASED_OUTPATIENT_CLINIC_OR_DEPARTMENT_OTHER): Payer: Self-pay

## 2021-01-08 ENCOUNTER — Other Ambulatory Visit (HOSPITAL_BASED_OUTPATIENT_CLINIC_OR_DEPARTMENT_OTHER): Payer: Self-pay

## 2021-01-15 NOTE — Progress Notes (Signed)
Patient ID: ZYRIA FISCUS, female    DOB: 02/02/1976  MRN: [id number]  CC: Neuropathy Follow-Up   Subjective: Sheri [name] is a 45 y.o. female who presents for neuropathy follow-up.   Her concerns today include:   Visit 12/20/2020 per PA note: HPI  Patient presents today for an acute visit for bilateral leg pain.  Patient describes the pain as burning and numbness traveling down both legs.  She states that also radiates into her feet.  Patient has previously tried a round of prednisone that did not help.  She takes a BC powder and Tylenol for the pain.  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  She does notice swelling to her feet at times.  Patient does have a history of diabetes and is concerned for neuropathy. Denies f/c/s, n/v/d, hemoptysis, PND, chest pain or edema.  A&P    May start stretching exercises   Alterate heat and cold compresses   Will order gabapentin   Will place referral to neurology   01/16/2021: Today reports still having bilateral leg numbness and pain. Described as feeling like fire.  Gabapentin has not given much relief. Has doubled the dose of Gabapentin to help with the pain. She does stand a lot at work. She is considering changing employment to see if this helps. Has an appointment with Neurology in August 2022. Would like to try Toradol injection.   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 8 g 0   Alcohol Swabs (ALCOHOL PREP) 70 % PADS One injection weekly. 100 each 1   amLODipine (NORVASC) 10 MG tablet Take 1 tablet (10 mg total) by mouth daily. 30 tablet 2   atorvastatin (LIPITOR) 20 MG tablet Take one tablet by mouth once daily 60 tablet 0   Blood Glucose Monitoring Suppl (TRUE METRIX METER) w/Device KIT Use as directed 1 kit 0   Dulaglutide (TRULICITY) 0.37 QD/6.4RC SOPN Inject 0.75 mg into the skin once a week. 3 mL 1   Dulaglutide 0.75 MG/0.5ML SOPN INJECT 0.75 MG UNDER THE SKIN ONCE WEEKLY 2 mL 0   famotidine (PEPCID) 20 MG tablet One after supper 30 tablet 11   glucose blood (TRUE METRIX BLOOD GLUCOSE TEST) test strip Use as instructed 100 each 12   Insulin Pen Needle (BD PEN NEEDLE MICRO U/F) 32G X 6 MM MISC One injection weekly. 100 each 1   olmesartan (BENICAR) 40 MG tablet Take 1 tablet (40 mg total) by mouth daily. 30 tablet 2   pantoprazole (PROTONIX) 40 MG tablet Take 1 tablet (40 mg total) by mouth daily. 30 tablet 1   tobramycin-dexamethasone (TOBRADEX) ophthalmic solution Instill 1 drop into each eye 4 times every day 5 mL 0   traZODone (DESYREL) 100 MG tablet Take 1 tablet (100 mg total) by mouth at bedtime. 30 tablet 1  TRUEplus Lancets 28G MISC Use as directed 100 each 4   No current facility-administered medications on file prior to visit.    Allergies  Allergen Reactions   Amoxicillin Hives and Rash   Iodine Anaphylaxis and Swelling   Penicillins Hives, Itching and Rash   Erythromycin Swelling and Other (See Comments)   Metformin Diarrhea    Social History   Socioeconomic History   Marital status: Single    Spouse name: Not on file   Number of children: Not on file   Years of education: Not on file   Highest education level: Not on file  Occupational History   Not on file  Tobacco Use   Smoking status: Some Days    Packs/day: 0.50    Years: 22.00     Pack years: 11.00    Types: Cigarettes   Smokeless tobacco: Never   Tobacco comments:    4-5 cigs per day 11/08/20//lmr  Vaping Use   Vaping Use: Some days  Substance and Sexual Activity   Alcohol use: Yes    Comment: occasional   Drug use: Not Currently   Sexual activity: Never    Birth control/protection: None  Other Topics Concern   Not on file  Social History Narrative   ** Merged History Encounter **       Social Determinants of Health   Financial Resource Strain: Not on file  Food Insecurity: Not on file  Transportation Needs: Not on file  Physical Activity: Not on file  Stress: Not on file  Social Connections: Not on file  Intimate Partner Violence: Not on file    Family History  Problem Relation Age of Onset   Hypertension Mother    Diabetes Mother    Diabetes Father    Hypertension Father    Colon cancer Maternal Grandfather    Diabetes Paternal Grandmother    Rectal cancer Neg Hx    Stomach cancer Neg Hx    Esophageal cancer Neg Hx     Past Surgical History:  Procedure Laterality Date   BASAL CELL CARCINOMA EXCISION     ed records 07/26/2020   CERVICAL DISC SURGERY      ROS: Review of Systems Negative except as stated above  PHYSICAL EXAM: BP (!) 144/96 (BP Location: Left Arm, Patient Position: Sitting, Cuff Size: Normal)   Pulse 95   Temp 98.1 F (36.7 C)   Resp 15   Ht 5' 7.99" (1.727 m)   Wt 250 lb 14.4 oz (113.8 kg)   SpO2 98%   BMI 38.16 kg/m   Physical Exam  General appearance - alert, well appearing, and in no distress and oriented to person, place, and time Mental status - alert, oriented to person, place, and time, normal mood, behavior, speech, dress, motor activity, and thought processes, she is tearful  Chest - clear to auscultation, no wheezes, rales or rhonchi, symmetric air entry, no tachypnea, retractions or cyanosis Heart - normal rate, regular rhythm, normal S1, S2, no murmurs, rubs, clicks or gallops Neurological -  alert, oriented, normal speech, no focal findings or movement disorder noted, decreased sensation bilaterally below knees Musculoskeletal - no joint deformity or swelling Extremities - peripheral pulses normal, no pedal edema, no clubbing or cyanosis   ASSESSMENT AND PLAN: 1. Diabetic mononeuropathy associated with type 2 diabetes mellitus ([place]): - Increase Gabapentin.  - Ketorolac administered today in office.  - Keep appointment with Neurology scheduled 03/20/2021. - Follow-up with primary provider as scheduled.  - ketorolac (TORADOL)  30 MG/ML injection 30 mg - gabapentin (NEURONTIN) 300 MG capsule; Take 1 capsule (300 mg total) by mouth 3 (three) times daily.  Dispense: 90 capsule; Refill: 0    Patient was given the opportunity to ask questions.  Patient verbalized understanding of the plan and was able to repeat key elements of the plan. Patient was given clear instructions to go to Emergency Department or return to medical center if symptoms don't improve, worsen, or new problems develop.The patient verbalized understanding.    Requested Prescriptions   Signed Prescriptions Disp Refills   gabapentin (NEURONTIN) 300 MG capsule 90 capsule 0    Sig: Take 1 capsule (300 mg total) by mouth 3 (three) times daily.    Follow-up with primary provider as scheduled.   Camillia Herter, NP

## 2021-01-16 ENCOUNTER — Other Ambulatory Visit: Payer: Self-pay

## 2021-01-16 ENCOUNTER — Encounter: Payer: Self-pay | Admitting: Family

## 2021-01-16 ENCOUNTER — Ambulatory Visit (INDEPENDENT_AMBULATORY_CARE_PROVIDER_SITE_OTHER): Payer: 59 | Admitting: Family

## 2021-01-16 ENCOUNTER — Other Ambulatory Visit (HOSPITAL_BASED_OUTPATIENT_CLINIC_OR_DEPARTMENT_OTHER): Payer: Self-pay

## 2021-01-16 VITALS — BP 144/96 | HR 95 | Temp 98.1°F | Resp 15 | Ht 67.99 in | Wt 250.9 lb

## 2021-01-16 DIAGNOSIS — E1141 Type 2 diabetes mellitus with diabetic mononeuropathy: Secondary | ICD-10-CM

## 2021-01-16 MED ORDER — GABAPENTIN 300 MG PO CAPS
300.0000 mg | ORAL_CAPSULE | Freq: Three times a day (TID) | ORAL | 0 refills | Status: DC
  Filled 2021-01-16: qty 90, 30d supply, fill #0

## 2021-01-16 MED ORDER — KETOROLAC TROMETHAMINE 30 MG/ML IJ SOLN
30.0000 mg | Freq: Once | INTRAMUSCULAR | Status: AC
  Administered 2021-01-16: 30 mg via INTRAMUSCULAR

## 2021-01-16 NOTE — Progress Notes (Signed)
Bilateral leg numbness and pain Pt states that it feels like legs are on fire Gabapentin is not giving any relief  Pt req Toradol injection

## 2021-01-21 ENCOUNTER — Other Ambulatory Visit (HOSPITAL_BASED_OUTPATIENT_CLINIC_OR_DEPARTMENT_OTHER): Payer: Self-pay

## 2021-01-22 ENCOUNTER — Other Ambulatory Visit (HOSPITAL_BASED_OUTPATIENT_CLINIC_OR_DEPARTMENT_OTHER): Payer: Self-pay

## 2021-01-30 ENCOUNTER — Other Ambulatory Visit: Payer: Self-pay | Admitting: Family

## 2021-01-30 ENCOUNTER — Other Ambulatory Visit (HOSPITAL_BASED_OUTPATIENT_CLINIC_OR_DEPARTMENT_OTHER): Payer: Self-pay

## 2021-01-31 ENCOUNTER — Other Ambulatory Visit: Payer: Self-pay

## 2021-01-31 ENCOUNTER — Other Ambulatory Visit (HOSPITAL_BASED_OUTPATIENT_CLINIC_OR_DEPARTMENT_OTHER): Payer: Self-pay

## 2021-01-31 DIAGNOSIS — E119 Type 2 diabetes mellitus without complications: Secondary | ICD-10-CM

## 2021-01-31 MED ORDER — TRULICITY 0.75 MG/0.5ML ~~LOC~~ SOAJ
0.7500 mg | SUBCUTANEOUS | 1 refills | Status: DC
  Filled 2021-02-26: qty 2, 28d supply, fill #0

## 2021-01-31 NOTE — Progress Notes (Signed)
Trulicity sent to [facility] in [place]

## 2021-02-08 ENCOUNTER — Other Ambulatory Visit (HOSPITAL_BASED_OUTPATIENT_CLINIC_OR_DEPARTMENT_OTHER): Payer: Self-pay

## 2021-02-17 ENCOUNTER — Other Ambulatory Visit: Payer: Self-pay

## 2021-02-17 ENCOUNTER — Emergency Department (HOSPITAL_BASED_OUTPATIENT_CLINIC_OR_DEPARTMENT_OTHER)
Admission: EM | Admit: 2021-02-17 | Discharge: 2021-02-17 | Disposition: A | Discharge status: Home / Self Care | Payer: 59 | Attending: Emergency Medicine | Admitting: Emergency Medicine

## 2021-02-17 ENCOUNTER — Encounter (HOSPITAL_BASED_OUTPATIENT_CLINIC_OR_DEPARTMENT_OTHER): Payer: Self-pay

## 2021-02-17 DIAGNOSIS — Z794 Long term (current) use of insulin: Secondary | ICD-10-CM | POA: Insufficient documentation

## 2021-02-17 DIAGNOSIS — I10 Essential (primary) hypertension: Secondary | ICD-10-CM | POA: Insufficient documentation

## 2021-02-17 DIAGNOSIS — Z7984 Long term (current) use of oral hypoglycemic drugs: Secondary | ICD-10-CM | POA: Insufficient documentation

## 2021-02-17 DIAGNOSIS — E785 Hyperlipidemia, unspecified: Secondary | ICD-10-CM | POA: Insufficient documentation

## 2021-02-17 DIAGNOSIS — G5711 Meralgia paresthetica, right lower limb: Secondary | ICD-10-CM | POA: Insufficient documentation

## 2021-02-17 DIAGNOSIS — Z85828 Personal history of other malignant neoplasm of skin: Secondary | ICD-10-CM | POA: Insufficient documentation

## 2021-02-17 DIAGNOSIS — F17210 Nicotine dependence, cigarettes, uncomplicated: Secondary | ICD-10-CM | POA: Insufficient documentation

## 2021-02-17 DIAGNOSIS — E1169 Type 2 diabetes mellitus with other specified complication: Secondary | ICD-10-CM | POA: Insufficient documentation

## 2021-02-17 DIAGNOSIS — E1140 Type 2 diabetes mellitus with diabetic neuropathy, unspecified: Secondary | ICD-10-CM | POA: Insufficient documentation

## 2021-02-17 DIAGNOSIS — G5710 Meralgia paresthetica, unspecified lower limb: Secondary | ICD-10-CM

## 2021-02-17 DIAGNOSIS — Z79899 Other long term (current) drug therapy: Secondary | ICD-10-CM | POA: Insufficient documentation

## 2021-02-17 HISTORY — DX: Type 2 diabetes mellitus without complications: E11.9

## 2021-02-17 MED ORDER — NAPROXEN 500 MG PO TABS: 500.0000 mg | ORAL_TABLET | Freq: Two times a day (BID) | ORAL | 0 refills | Status: AC

## 2021-02-17 MED ORDER — HYDROCODONE-ACETAMINOPHEN 5-325 MG PO TABS: 1.0000 | ORAL_TABLET | Freq: Four times a day (QID) | ORAL | 0 refills | Status: DC | PRN

## 2021-02-17 NOTE — ED Triage Notes (Signed)
Pt reports right leg pain. Reports a burning sensation x several weeks. Recently started on 300mg  Gabapentin. Sts no improvement.

## 2021-02-17 NOTE — ED Provider Notes (Signed)
[facility] EMERGENCY DEPARTMENT Provider Note   CSN: [id number] Arrival date & time: 02/17/21  1912     History Chief Complaint  Patient presents with   Leg Pain    Sheri [name] is a 45 y.o. female.  HPI  45 year old female with a history of diabetes, GERD, hypertension, neuropathy, who presents to the emergency department today for evaluation of acute exacerbation of her chronic neuropathy.  States that she is having pain and a burning sensation to the right thigh mostly to the anterior lateral aspect.  Symptoms have been present for the last several weeks.  She was seen by her PCP about this a few months ago and started on gabapentin which was recently increased.  She states that the increased dose has not resolved her symptoms and it is very painful to walk.  She denies any back pain. She denies any weakness of the lower extremities or any loss control of bowel or bladder function.  There have been no other reported systemic symptoms present.  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     Family History  Problem Relation Age of Onset   Hypertension Mother    Diabetes Mother    Diabetes Father    Hypertension  Father    Colon cancer Maternal Grandfather    Diabetes Paternal Grandmother    Rectal cancer Neg Hx    Stomach cancer Neg Hx    Esophageal cancer Neg Hx     Social History   Tobacco Use   Smoking status: Some Days    Packs/day: 0.50    Years: 22.00    Pack years: 11.00    Types: Cigarettes   Smokeless tobacco: Never   Tobacco comments:    4-5 cigs per day 11/08/20//lmr  Vaping Use   Vaping Use: Some days  Substance Use Topics   Alcohol use: Yes    Comment: occasional   Drug use: Not Currently    Home Medications Prior to Admission medications   Medication Sig Start Date End Date Taking? Authorizing Provider  HYDROcodone-acetaminophen (NORCO/VICODIN) 5-325 MG tablet Take 1 tablet by mouth every 6 (six) hours as needed. 02/17/21  Yes Rece Zechman S, PA-C  naproxen (NAPROSYN) 500 MG tablet Take 1 tablet (500 mg total) by mouth 2 (two) times daily for 7 days. 02/17/21 02/24/21 Yes [place] Hudman S, PA-C  albuterol (VENTOLIN HFA) 108 (90 Base) MCG/ACT inhaler Inhale 2 puffs into the lungs every 6 (six) hours as needed for wheezing or shortness of breath. 10/17/20   Camillia Herter, NP  Alcohol Swabs (ALCOHOL PREP) 70 % PADS One injection weekly. 10/18/20   Minette Brine,  Amy J, NP  amLODipine (NORVASC) 10 MG tablet Take 1 tablet (10 mg total) by mouth daily. 12/07/20 03/07/21  Camillia Herter, NP  atorvastatin (LIPITOR) 20 MG tablet Take one tablet by mouth once daily 10/18/20   Camillia Herter, NP  Blood Glucose Monitoring Suppl (TRUE METRIX METER) w/Device KIT Use as directed 10/18/20   Camillia Herter, NP  Dulaglutide (TRULICITY) 7.02 OV/7.8HY SOPN Inject 0.75 mg into the skin once a week. 01/31/21 03/14/21  Camillia Herter, NP  Dulaglutide 0.75 MG/0.5ML SOPN INJECT 0.75 MG UNDER THE SKIN ONCE WEEKLY 11/06/20 11/06/21  Camillia Herter, NP  famotidine (PEPCID) 20 MG tablet One after supper 11/08/20   Tanda Rockers, MD  gabapentin (NEURONTIN) 300 MG capsule Take 1 capsule (300 mg total) by mouth  3 (three) times daily. 01/16/21   Camillia Herter, NP  glucose blood (TRUE METRIX BLOOD GLUCOSE TEST) test strip Use as instructed 10/18/20   Camillia Herter, NP  Insulin Pen Needle (BD PEN NEEDLE MICRO U/F) 32G X 6 MM MISC One injection weekly. 10/18/20   Camillia Herter, NP  olmesartan (BENICAR) 40 MG tablet Take 1 tablet (40 mg total) by mouth daily. 11/14/20   Camillia Herter, NP  pantoprazole (PROTONIX) 40 MG tablet Take 1 tablet (40 mg total) by mouth daily. 09/27/20   Noralyn Pick, NP  tobramycin-dexamethasone [facility]) ophthalmic solution Instill 1 drop into each eye 4 times every day 11/26/20     traZODone (DESYREL) 100 MG tablet Take 1 tablet (100 mg total) by mouth at bedtime. 11/19/20 02/24/21  Camillia Herter, NP  TRUEplus Lancets 28G MISC Use as directed 10/18/20   Camillia Herter, NP    Allergies    Amoxicillin, Iodine, Penicillins, Erythromycin, and Metformin  Review of Systems   Review of Systems  Cardiovascular:  Negative for leg swelling.  Musculoskeletal:        Right leg pain  Neurological:  Negative for weakness and numbness.   Physical Exam Updated Vital Signs BP (!) 162/95   Pulse 100   Temp 98.9 F (37.2 C)   Resp 18   Ht 5' 9"  (1.753 m)   Wt 117.5 kg   SpO2 99%   BMI 38.25 kg/m   Physical Exam Vitals and nursing note reviewed.  Constitutional:      General: She is not in acute distress.    Appearance: She is well-developed.  HENT:     Head: Normocephalic and atraumatic.  Eyes:     Conjunctiva/sclera: Conjunctivae normal.  Cardiovascular:     Rate and Rhythm: Normal rate and regular rhythm.     Heart sounds: No murmur heard. Pulmonary:     Effort: Pulmonary effort is normal. No respiratory distress.     Breath sounds: Normal breath sounds.  Abdominal:     Palpations: Abdomen is soft.     Tenderness: There is no abdominal tenderness.  Musculoskeletal:     Cervical back: Neck supple.     Comments: Paresthesias noted to the anterolateral  right thigh with some ttp noted. Dp pulses 2+ bilaterally. Normal strength to the ble and sensation is otherwise intact.  Skin:    General: Skin is warm and dry.  Neurological:     Mental Status: She is alert.    ED Results / Procedures / Treatments   Labs (all labs ordered are listed, but only abnormal results are displayed) Labs Reviewed - No data to display  EKG None  Radiology No results found.  Procedures Procedures   Medications Ordered in ED Medications - No data to display  ED Course  I have reviewed the triage vital signs and the nursing notes.  Pertinent labs & imaging results that were available during my care of the patient were reviewed by me and considered in my medical decision making (see chart for details).    MDM Rules/Calculators/A&P                          45 year old female here with complaints of pain to the right anterolateral thigh present for several weeks.  Has previously been on gabapentin without resolution of symptoms.  Symptoms consistent with meralgia paresthetica which could be related to her obesity and diabetes.  She has no significant neurologic deficits or other red flag findings on exam to suggest alternative or emergent diagnosis.  Have advised continuation of her gabapentin and will give a short course of pain medications and anti-inflammatories to help with her symptoms.  Advised close follow-up with PCP and strict return precautions.  She voices understanding of the plan and reasons to return.  All questions answered.  Patient stable for discharge.  Final Clinical Impression(s) / ED Diagnoses Final diagnoses:  Meralgia paresthetica, unspecified laterality    Rx / DC Orders ED Discharge Orders          Ordered    HYDROcodone-acetaminophen (NORCO/VICODIN) 5-325 MG tablet  Every 6 hours PRN        02/17/21 1951    naproxen (NAPROSYN) 500 MG tablet  2 times daily        02/17/21 [id number] 02/17/21 1951    Davonna Belling, MD 02/17/21 2217

## 2021-02-17 NOTE — Discharge Instructions (Addendum)
Take naproxen as directed  Prescription given for norco. Take medication as directed and do not operate machinery, drive a car, or work while taking this medication as it can make you drowsy.   Please follow up with your primary doctor within the next 7-10 days for re-evaluation and further treatment of your symptoms.   Please return to the ER sooner if you have any new or worsening symptoms.

## 2021-02-26 ENCOUNTER — Other Ambulatory Visit (HOSPITAL_BASED_OUTPATIENT_CLINIC_OR_DEPARTMENT_OTHER): Payer: Self-pay

## 2021-02-26 ENCOUNTER — Other Ambulatory Visit: Payer: Self-pay | Admitting: Family

## 2021-02-26 DIAGNOSIS — G4700 Insomnia, unspecified: Secondary | ICD-10-CM

## 2021-02-26 DIAGNOSIS — E1141 Type 2 diabetes mellitus with diabetic mononeuropathy: Secondary | ICD-10-CM

## 2021-02-27 ENCOUNTER — Other Ambulatory Visit (HOSPITAL_BASED_OUTPATIENT_CLINIC_OR_DEPARTMENT_OTHER): Payer: Self-pay

## 2021-02-27 MED ORDER — GABAPENTIN 300 MG PO CAPS
300.0000 mg | ORAL_CAPSULE | Freq: Three times a day (TID) | ORAL | 0 refills | Status: DC
  Filled 2021-02-27: qty 90, 30d supply, fill #0

## 2021-02-27 MED ORDER — TRAZODONE HCL 100 MG PO TABS
100.0000 mg | ORAL_TABLET | Freq: Every day | ORAL | 1 refills | Status: AC
  Filled 2021-02-27: qty 30, 30d supply, fill #0
  Filled 2021-10-14 – 2021-10-16 (×2): qty 30, 30d supply, fill #1

## 2021-03-01 ENCOUNTER — Ambulatory Visit: Payer: 59 | Admitting: Family

## 2021-03-02 NOTE — Progress Notes (Addendum)
Patient ID: RAYCHELL HOLCOMB, female    DOB: Jun 13, 1976  MRN: [id number]  CC: Hypertension Follow-Up   Subjective: Alexsandria Kivett is a 45 y.o. female who presents for hypertension follow-up.   Her concerns today include:   HYPERTENSION FOLLOW-UP: 11/14/2020: - No longer having cough since Lisinopril-Hydrochlorothiazide discontinued. - Continue Olmesartan and Amlodipine as prescribed. - Follow-up with primary provider in 3 months or sooner if needed.   03/04/2021: Doing well on current regimen. No side effects. No issues/concerns.   2. DIABETES TYPE 2 FOLLOW-UP: Doing well with Trulicity. Would like to try a continuous glucose monitor.   3. DIABETIC NEUROPATHY FOLLOW-UP: 01/16/2021: - Increase Gabapentin. - Ketorolac administered today in office. - Keep appointment with Neurology scheduled 03/20/2021. - Follow-up with primary provider as scheduled.   Visit 02/17/2021 at [facility] Emergency Department per MD note: 45 year old female here with complaints of pain to the right anterolateral thigh present for several weeks.  Has previously been on gabapentin without resolution of symptoms.  Symptoms consistent with meralgia paresthetica which could be related to her obesity and diabetes.  She has no significant neurologic deficits or other red flag findings on exam to suggest alternative or emergent diagnosis.  Have advised continuation of her gabapentin and will give a short course of pain medications and anti-inflammatories to help with her symptoms.  Advised close follow-up with PCP and strict return precautions.  She voices understanding of the plan and reasons to return.  All questions answered.  Patient stable for discharge.  03/04/2021: Neuropathy worsening since last visit especially in bilateral thighs. Swelling of bilateral lower extremities. Denies tenderness, warmth, redness, shortness of breath, and chest pain. Gabapentin not helping.    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 100 each 1   atorvastatin (LIPITOR) 20 MG tablet Take one tablet by mouth once daily 60 tablet 0   Blood Glucose Monitoring Suppl (TRUE METRIX METER) w/Device KIT Use as directed 1 kit 0   famotidine (PEPCID) 20 MG tablet One after supper 30 tablet 11   gabapentin (NEURONTIN) 300 MG capsule Take 1 capsule (300 mg total) by mouth 3 (three) times daily. 90 capsule 0   glucose blood (TRUE METRIX BLOOD GLUCOSE TEST) test strip Use as instructed 100 each 12   Insulin Pen Needle (BD PEN NEEDLE MICRO U/F) 32G X 6 MM MISC One injection weekly. 100 each 1   pantoprazole (PROTONIX) 40 MG tablet Take 1 tablet (40 mg total) by mouth daily. 30 tablet 1   traZODone (DESYREL) 100 MG tablet Take 1 tablet (100 mg total) by mouth at bedtime. 30 tablet 1   TRUEplus Lancets 28G MISC Use as directed 100 each 4   albuterol (VENTOLIN HFA) 108 (90 Base) MCG/ACT inhaler Inhale 2 puffs into the lungs every 6 (six) hours as needed for wheezing or shortness of breath. (Patient not taking: Reported on 03/04/2021) 8 g 0   No current facility-administered medications on file prior to visit.  Allergies  Allergen Reactions   Amoxicillin Hives and Rash   Iodine Anaphylaxis and Swelling   Penicillins Hives, Itching and Rash   Erythromycin Swelling and Other (See Comments)   Metformin Diarrhea    Social History   Socioeconomic History   Marital status: Single    Spouse name: Not on file   Number of  children: Not on file   Years of education: Not on file   Highest education level: Not on file  Occupational History   Not on file  Tobacco Use   Smoking status: Some Days    Packs/day: 0.50    Years: 22.00    Pack years: 11.00    Types: Cigarettes   Smokeless tobacco: Never   Tobacco comments:    4-5 cigs per day 11/08/20//lmr  Vaping Use   Vaping Use: Some days  Substance and Sexual Activity   Alcohol use: Yes    Comment: occasional   Drug use: Not Currently   Sexual activity: Never    Birth control/protection: None  Other Topics Concern   Not on file  Social History Narrative   ** Merged History Encounter **       Social Determinants of Health   Financial Resource Strain: Not on file  Food Insecurity: Not on file  Transportation Needs: Not on file  Physical Activity: Not on file  Stress: Not on file  Social Connections: Not on file  Intimate Partner Violence: Not on file    Family History  Problem Relation Age of Onset   Hypertension Mother    Diabetes Mother    Diabetes Father    Hypertension Father    Colon cancer Maternal Grandfather    Diabetes Paternal Grandmother    Rectal cancer Neg Hx    Stomach cancer Neg Hx    Esophageal cancer Neg Hx     Past Surgical History:  Procedure Laterality Date   BASAL CELL CARCINOMA EXCISION     ed records 07/26/2020   CERVICAL DISC SURGERY      ROS: Review of Systems Negative except as stated above  PHYSICAL EXAM: BP 131/82 (BP Location: Left Arm, Patient Position: Sitting, Cuff Size: Large)   Pulse 90   Temp 97.9 F (36.6 C)   Resp 18   Ht 5' 7.99" (1.727 m)   Wt 261 lb (118.4 kg)   SpO2 98%   BMI 39.69 kg/m   Physical Exam HENT:     Head: Normocephalic and atraumatic.  Eyes:     Extraocular Movements: Extraocular movements intact.     Conjunctiva/sclera: Conjunctivae normal.  Cardiovascular:     Rate and Rhythm: Normal rate and regular rhythm.     Pulses: Normal pulses.     Heart sounds:  Normal heart sounds.  Pulmonary:     Effort: Pulmonary effort is normal.     Breath sounds: Normal breath sounds.  Musculoskeletal:     Cervical back: Normal range of motion and neck supple.     Right lower leg: 1+ Edema present.     Left lower leg: 1+ Edema present.  Skin:    General: Skin is warm and dry.     Capillary Refill: Capillary refill takes less than 2 seconds.  Neurological:     General: No focal deficit present.     Mental Status: She is alert and oriented to person, place, and time.  Psychiatric:        Mood and Affect: Mood normal.  Behavior: Behavior normal.    ASSESSMENT AND PLAN: 1. Essential hypertension: - Continue Amlodipine and Olmesartan as prescribed.  - Counseled on blood pressure goal of less than 130/80, low-sodium, DASH diet, medication compliance, 150 minutes of moderate intensity exercise per week as tolerated. Discussed medication compliance, adverse effects. - Follow-up with primary provider in 3 months or sooner if needed.  - amLODipine (NORVASC) 10 MG tablet; Take 1 tablet (10 mg total) by mouth daily.  Dispense: 90 tablet; Refill: 0 - olmesartan (BENICAR) 40 MG tablet; Take 1 tablet (40 mg total) by mouth daily.  Dispense: 90 tablet; Refill: 0  2. Type 2 diabetes mellitus without complication, unspecified whether long term insulin use ([facility]): - Hemoglobin A1c today at goal at 7.0%. This is improved from previous hemoglobin A1c of 9.1% on 10/17/2020. Next hemoglobin A1c due October 2022.  - Continue Dulaglutide as prescribed. - Discussed the importance of healthy eating habits, low-carbohydrate diet, low-sugar diet, regular aerobic exercise (at least 150 minutes a week as tolerated) and medication compliance to achieve or maintain control of diabetes. - Per patient request Freestyle Libre ordered for continuous glucose monitoring. - Follow-up with primary provider in 3 months or sooner if needed.  - POCT glycosylated hemoglobin (Hb A1C) -  Dulaglutide (TRULICITY) 8.24 MP/5.3IR SOPN; Inject 0.75 mg into the skin once a week.  Dispense: 6 mL; Refill: 0 - Continuous Blood Gluc Receiver (FREESTYLE LIBRE READER) DEVI; Use as directed  Dispense: 1 each; Refill: 0 - Continuous Blood Gluc Sensor (FREESTYLE LIBRE 2 SENSOR) MISC; Use 4 times daily before meals and at bedtime  Dispense: 2 each; Refill: 0  3. Meralgia paresthetica, unspecified laterality: - Gabapentin discontinued related to ineffectiveness.  - Begin Duloxetine as prescribed.  - Keep appointment scheduled 03/20/2021 with [facility].  - Follow-up with primary provider in 4 weeks or sooner if needed.  - DULoxetine (CYMBALTA) 60 MG capsule; Take 1 capsule (60 mg total) by mouth daily.  Dispense: 60 capsule; Refill: 0  4. Peripheral edema: - Furosemide as prescribed for bilateral lower extremity edema.  - Follow-up with primary provider as scheduled.  - furosemide (LASIX) 20 MG tablet; Take 1 tablet (20 mg total) by mouth daily for 14 days.  Dispense: 14 tablet; Refill: 0   Patient was given the opportunity to ask questions.  Patient verbalized understanding of the plan and was able to repeat key elements of the plan. Patient was given clear instructions to go to Emergency Department or return to medical center if symptoms don't improve, worsen, or new problems develop.The patient verbalized understanding.   Orders Placed This Encounter  Procedures   POCT glycosylated hemoglobin (Hb A1C)     Requested Prescriptions   Signed Prescriptions Disp Refills   DULoxetine (CYMBALTA) 60 MG capsule 60 capsule 0    Sig: Take 1 capsule (60 mg total) by mouth daily.   amLODipine (NORVASC) 10 MG tablet 90 tablet 0    Sig: Take 1 tablet (10 mg total) by mouth daily.   olmesartan (BENICAR) 40 MG tablet 90 tablet 0    Sig: Take 1 tablet (40 mg total) by mouth daily.   Dulaglutide (TRULICITY) 4.43 XV/4.0GQ SOPN 6 mL 0    Sig: Inject 0.75 mg into the skin once a  week.   furosemide (LASIX) 20 MG tablet 14 tablet 0    Sig: Take 1 tablet (20 mg total) by mouth daily for 14 days.   Continuous Blood Gluc Receiver (FREESTYLE LIBRE READER) DEVI 1 each  0    Sig: Use as directed   Continuous Blood Gluc Sensor (FREESTYLE LIBRE 2 SENSOR) MISC 2 each 0    Sig: Use 4 times daily before meals and at bedtime    Follow-up with primary provider as scheduled.   Camillia Herter, NP

## 2021-03-04 ENCOUNTER — Other Ambulatory Visit (HOSPITAL_BASED_OUTPATIENT_CLINIC_OR_DEPARTMENT_OTHER): Payer: Self-pay

## 2021-03-04 ENCOUNTER — Encounter: Payer: Self-pay | Admitting: Family

## 2021-03-04 ENCOUNTER — Other Ambulatory Visit: Payer: Self-pay

## 2021-03-04 ENCOUNTER — Ambulatory Visit (INDEPENDENT_AMBULATORY_CARE_PROVIDER_SITE_OTHER): Payer: BC Managed Care – PPO | Admitting: Family

## 2021-03-04 VITALS — BP 131/82 | HR 90 | Temp 97.9°F | Resp 18 | Ht 67.99 in | Wt 261.0 lb

## 2021-03-04 DIAGNOSIS — R609 Edema, unspecified: Secondary | ICD-10-CM

## 2021-03-04 DIAGNOSIS — I10 Essential (primary) hypertension: Secondary | ICD-10-CM

## 2021-03-04 DIAGNOSIS — G5710 Meralgia paresthetica, unspecified lower limb: Secondary | ICD-10-CM

## 2021-03-04 DIAGNOSIS — E119 Type 2 diabetes mellitus without complications: Secondary | ICD-10-CM

## 2021-03-04 DIAGNOSIS — E1141 Type 2 diabetes mellitus with diabetic mononeuropathy: Secondary | ICD-10-CM

## 2021-03-04 LAB — POCT GLYCOSYLATED HEMOGLOBIN (HGB A1C): Hemoglobin A1C: 7 % — AB (ref 4.0–5.6)

## 2021-03-04 MED ORDER — AMLODIPINE BESYLATE 10 MG PO TABS
10.0000 mg | ORAL_TABLET | Freq: Every day | ORAL | 0 refills | Status: DC
  Filled 2021-03-04 – 2021-06-04 (×2): qty 90, 90d supply, fill #0

## 2021-03-04 MED ORDER — FUROSEMIDE 20 MG PO TABS
20.0000 mg | ORAL_TABLET | Freq: Every day | ORAL | 0 refills | Status: DC
  Filled 2021-03-04: qty 14, 14d supply, fill #0

## 2021-03-04 MED ORDER — DULOXETINE HCL 60 MG PO CPEP
60.0000 mg | ORAL_CAPSULE | Freq: Every day | ORAL | 0 refills | Status: DC
  Filled 2021-03-04: qty 60, 60d supply, fill #0

## 2021-03-04 MED ORDER — OLMESARTAN MEDOXOMIL 40 MG PO TABS
40.0000 mg | ORAL_TABLET | Freq: Every day | ORAL | 0 refills | Status: DC
  Filled 2021-03-04 – 2021-06-04 (×2): qty 90, 90d supply, fill #0

## 2021-03-04 MED ORDER — TRULICITY 0.75 MG/0.5ML ~~LOC~~ SOPN
0.7500 mg | PEN_INJECTOR | SUBCUTANEOUS | 0 refills | Status: AC
Start: 2021-03-04 — End: 2021-06-21
  Filled 2021-03-04 – 2021-03-29 (×2): qty 6, 84d supply, fill #0

## 2021-03-04 NOTE — Progress Notes (Signed)
Pt presents for diabetes and hypertension, for approx 2 weeks pt experiencing bilateral leg edema   Pt wants to switch to a CGM and gabapentin 300 mg is not helping anymore

## 2021-03-05 ENCOUNTER — Encounter: Payer: Self-pay | Admitting: Family

## 2021-03-05 ENCOUNTER — Other Ambulatory Visit (HOSPITAL_BASED_OUTPATIENT_CLINIC_OR_DEPARTMENT_OTHER): Payer: Self-pay

## 2021-03-05 MED ORDER — FREESTYLE LIBRE 2 SENSOR MISC
1.0000 | Freq: Three times a day (TID) | 0 refills | Status: DC
  Filled 2021-03-05 – 2021-03-29 (×2): qty 2, 28d supply, fill #0

## 2021-03-05 MED ORDER — FREESTYLE LIBRE READER DEVI
1.0000 | Freq: Once | 0 refills | Status: DC
  Filled 2021-03-05 – 2021-03-29 (×2): qty 1, 30d supply, fill #0

## 2021-03-08 DIAGNOSIS — G5710 Meralgia paresthetica, unspecified lower limb: Secondary | ICD-10-CM | POA: Insufficient documentation

## 2021-03-13 ENCOUNTER — Other Ambulatory Visit (HOSPITAL_BASED_OUTPATIENT_CLINIC_OR_DEPARTMENT_OTHER): Payer: Self-pay

## 2021-03-20 ENCOUNTER — Telehealth: Payer: Self-pay

## 2021-03-20 ENCOUNTER — Encounter: Payer: Self-pay | Admitting: Neurology

## 2021-03-20 ENCOUNTER — Ambulatory Visit (INDEPENDENT_AMBULATORY_CARE_PROVIDER_SITE_OTHER): Payer: Self-pay | Admitting: Neurology

## 2021-03-20 VITALS — BP 133/80 | HR 115 | Temp 102.1°F | Ht 68.0 in | Wt 258.3 lb

## 2021-03-20 DIAGNOSIS — R200 Anesthesia of skin: Secondary | ICD-10-CM

## 2021-03-20 NOTE — Telephone Encounter (Signed)
Pt came for appt with Dr. Felecia Shelling today and at the office temp was checked and reported at 102.1. Discussed with Dr. Felecia Shelling and since temp was high, pt was sent home and appt rescheduled for next week.

## 2021-03-20 NOTE — Progress Notes (Signed)
Due to the patient having a fever, the appointment was rescheduled.

## 2021-03-26 ENCOUNTER — Ambulatory Visit: Payer: Self-pay | Admitting: Neurology

## 2021-03-29 ENCOUNTER — Other Ambulatory Visit (HOSPITAL_BASED_OUTPATIENT_CLINIC_OR_DEPARTMENT_OTHER): Payer: Self-pay

## 2021-04-01 ENCOUNTER — Other Ambulatory Visit: Payer: Self-pay | Admitting: Family

## 2021-04-01 ENCOUNTER — Other Ambulatory Visit (HOSPITAL_BASED_OUTPATIENT_CLINIC_OR_DEPARTMENT_OTHER): Payer: Self-pay

## 2021-04-01 DIAGNOSIS — E119 Type 2 diabetes mellitus without complications: Secondary | ICD-10-CM

## 2021-04-02 ENCOUNTER — Other Ambulatory Visit (HOSPITAL_BASED_OUTPATIENT_CLINIC_OR_DEPARTMENT_OTHER): Payer: Self-pay

## 2021-04-03 ENCOUNTER — Other Ambulatory Visit (HOSPITAL_BASED_OUTPATIENT_CLINIC_OR_DEPARTMENT_OTHER): Payer: Self-pay

## 2021-04-03 ENCOUNTER — Other Ambulatory Visit: Payer: Self-pay

## 2021-04-03 DIAGNOSIS — E119 Type 2 diabetes mellitus without complications: Secondary | ICD-10-CM

## 2021-04-03 MED ORDER — FREESTYLE LIBRE 2 SENSOR MISC
1.0000 | Freq: Three times a day (TID) | 0 refills | Status: DC
  Filled 2021-04-03: qty 2, 28d supply, fill #0
  Filled 2021-04-03: qty 1, 14d supply, fill #0
  Filled 2021-06-04: qty 1, 14d supply, fill #1

## 2021-05-02 NOTE — Progress Notes (Signed)
Virtual Visit via Telephone Note  I connected with Sheri [name], on 05/03/2021 at 4:15 PM by telephone due to the COVID-19 pandemic and verified that I am speaking with the correct person using two identifiers.  Due to current restrictions/limitations of in-office visits due to the COVID-19 pandemic, this scheduled clinical appointment was converted to a telehealth visit.   Consent: I discussed the limitations, risks, security and privacy concerns of performing an evaluation and management service by telephone and the availability of in person appointments. I also discussed with the patient that there may be a patient responsible charge related to this service. The patient expressed understanding and agreed to proceed.   Location of Patient: Home  Location of Provider: [facility] [place] participating in Telemedicine visit: Lucynda Rosano Gilbert [place] Joslin Minette Brine, NP [place] Else, [place]   History of Present Illness: Sheri [name]. Matsuo is a 45 year-old female who presents for diabetes follow-up.   DIABETES TYPE 2 FOLLOW-UP: 03/04/2021: - Hemoglobin A1c today at goal at 7.0%. This is improved from previous hemoglobin A1c of 9.1% on 10/17/2020. Next hemoglobin A1c due October 2022.  - Continue Dulaglutide as prescribed.   05/03/2021: Reports feeling nausea and unwell during the week with low blood sugars in the 60's at least 4 times daily. Reports after taking her Trulicity injection she feels well for a couple days and then back to feeling unwell again. Reports decreased energy. Eating well.    Past Medical History:  Diagnosis Date   Diabetes mellitus without complication (HCC)    GERD (gastroesophageal reflux disease)    H/O neck surgery    ED records from 07/26/2020   Hypertension    Skin cancer    Allergies  Allergen Reactions   Amoxicillin Hives and Rash   Iodine Anaphylaxis and Swelling   Penicillins Hives, Itching and Rash   Erythromycin  Swelling and Other (See Comments)   Metformin Diarrhea    Current Outpatient Medications on File Prior to Visit  Medication Sig Dispense Refill   albuterol (VENTOLIN HFA) 108 (90 Base) MCG/ACT inhaler Inhale 2 puffs into the lungs every 6 (six) hours as needed for wheezing or shortness of breath. 8 g 0   Alcohol Swabs (ALCOHOL PREP) 70 % PADS One injection weekly. 100 each 1   amLODipine (NORVASC) 10 MG tablet Take 1 tablet (10 mg total) by mouth daily. 90 tablet 0   atorvastatin (LIPITOR) 20 MG tablet Take one tablet by mouth once daily 60 tablet 0   Blood Glucose Monitoring Suppl (TRUE METRIX METER) w/Device KIT Use as directed 1 kit 0   Continuous Blood Gluc Sensor (FREESTYLE LIBRE 2 SENSOR) MISC Use as directed to check blood sugar 2 each 0   Dulaglutide (TRULICITY) 1.61 WR/6.0AV SOPN Inject 0.75 mg into the skin once a week. 6 mL 0   DULoxetine (CYMBALTA) 60 MG capsule Take 1 capsule (60 mg total) by mouth daily. 60 capsule 0   famotidine (PEPCID) 20 MG tablet One after supper 30 tablet 11   furosemide (LASIX) 20 MG tablet Take 1 tablet (20 mg total) by mouth daily for 14 days. 14 tablet 0   glucose blood (TRUE METRIX BLOOD GLUCOSE TEST) test strip Use as instructed 100 each 12   Insulin Pen Needle (BD PEN NEEDLE MICRO U/F) 32G X 6 MM MISC One injection weekly. 100 each 1   olmesartan (BENICAR) 40 MG tablet Take 1 tablet (40 mg total) by mouth daily. 90 tablet  0   pantoprazole (PROTONIX) 40 MG tablet Take 1 tablet (40 mg total) by mouth daily. 30 tablet 1   traZODone (DESYREL) 100 MG tablet Take 1 tablet (100 mg total) by mouth at bedtime. 30 tablet 1   TRUEplus Lancets 28G MISC Use as directed 100 each 4   No current facility-administered medications on file prior to visit.    Observations/Objective: Alert and oriented x 3. Not in acute distress. Physical examination not completed as this is a telemedicine visit.  Assessment and Plan: 1. Type 2 diabetes mellitus without  complication, unspecified whether long term insulin use ([place]): - Continue Dulaglutide as prescribed.  - Patient is intolerant to Metformin.  - Counseled to schedule visit for hemoglobin A1c check on next week. Follow-up with primary provider sooner if needed.   Follow Up Instructions: Return for hemoglobin A1c on next week.    Patient was given clear instructions to go to Emergency Department or return to medical center if symptoms don't improve, worsen, or new problems develop.The patient verbalized understanding.  I discussed the assessment and treatment plan with the patient. The patient was provided an opportunity to ask questions and all were answered. The patient agreed with the plan and demonstrated an understanding of the instructions.   The patient was advised to call back or seek an in-person evaluation if the symptoms worsen or if the condition fails to improve as anticipated.    I provided 10 minutes total of non-face-to-face time during this encounter.   Camillia Herter, NP  [facility] [place], [place] 05/03/2021, 4:15 PM

## 2021-05-03 ENCOUNTER — Other Ambulatory Visit: Payer: Self-pay

## 2021-05-03 ENCOUNTER — Encounter: Payer: Self-pay | Admitting: Family

## 2021-05-03 ENCOUNTER — Ambulatory Visit (INDEPENDENT_AMBULATORY_CARE_PROVIDER_SITE_OTHER): Payer: BC Managed Care – PPO | Admitting: Family

## 2021-05-03 DIAGNOSIS — E119 Type 2 diabetes mellitus without complications: Secondary | ICD-10-CM | POA: Diagnosis not present

## 2021-05-03 NOTE — Progress Notes (Signed)
Pt presents for telemedicine for low blood sugars pt reports that her blood sugars drop until she takes her weekly insulin injection on Thursdays

## 2021-05-10 ENCOUNTER — Ambulatory Visit (INDEPENDENT_AMBULATORY_CARE_PROVIDER_SITE_OTHER): Payer: BC Managed Care – PPO

## 2021-05-10 ENCOUNTER — Other Ambulatory Visit: Payer: Self-pay

## 2021-05-10 DIAGNOSIS — E119 Type 2 diabetes mellitus without complications: Secondary | ICD-10-CM

## 2021-05-10 LAB — POCT GLYCOSYLATED HEMOGLOBIN (HGB A1C): Hemoglobin A1C: 6.7 % — AB (ref 4.0–5.6)

## 2021-06-04 ENCOUNTER — Other Ambulatory Visit (HOSPITAL_BASED_OUTPATIENT_CLINIC_OR_DEPARTMENT_OTHER): Payer: Self-pay

## 2021-06-04 ENCOUNTER — Other Ambulatory Visit: Payer: Self-pay | Admitting: Family

## 2021-06-05 ENCOUNTER — Other Ambulatory Visit: Payer: Self-pay | Admitting: Family

## 2021-06-05 DIAGNOSIS — E119 Type 2 diabetes mellitus without complications: Secondary | ICD-10-CM

## 2021-06-06 ENCOUNTER — Other Ambulatory Visit (HOSPITAL_BASED_OUTPATIENT_CLINIC_OR_DEPARTMENT_OTHER): Payer: Self-pay

## 2021-06-06 MED ORDER — FREESTYLE LIBRE 2 SENSOR MISC
1.0000 | Freq: Three times a day (TID) | 0 refills | Status: DC
  Filled 2021-06-06: qty 2, 28d supply, fill #0

## 2021-06-07 ENCOUNTER — Ambulatory Visit: Payer: BC Managed Care – PPO | Attending: Internal Medicine

## 2021-06-07 ENCOUNTER — Other Ambulatory Visit (HOSPITAL_BASED_OUTPATIENT_CLINIC_OR_DEPARTMENT_OTHER): Payer: Self-pay

## 2021-06-07 DIAGNOSIS — Z23 Encounter for immunization: Secondary | ICD-10-CM

## 2021-06-07 MED ORDER — INFLUENZA VAC SPLIT QUAD 0.5 ML IM SUSY
PREFILLED_SYRINGE | INTRAMUSCULAR | 0 refills | Status: DC
  Filled 2021-06-07: qty 0.5, 1d supply, fill #0

## 2021-06-07 NOTE — Progress Notes (Signed)
   Covid-19 Vaccination Clinic  Name:  Sheri [name]    MRN: [id number] DOB: 02-02-1976  06/07/2021  Ms. Sheri [name] was observed post Covid-19 immunization for 15 minutes without incident. She was provided with Vaccine Information Sheet and instruction to access the V-Safe system.   Ms. Sheri [name] was instructed to call 911 with any severe reactions post vaccine: Difficulty breathing  Swelling of face and throat  A fast heartbeat  A bad rash all over body  Dizziness and weakness   Immunizations Administered     Name Date Dose VIS Date Route   Moderna Covid-19 vaccine Bivalent Booster 06/07/2021  2:04 PM 0.5 mL 03/23/2021 Intramuscular   Manufacturer: Moderna   Lot: [id number]   [place]: [id number]

## 2021-06-12 ENCOUNTER — Other Ambulatory Visit (HOSPITAL_BASED_OUTPATIENT_CLINIC_OR_DEPARTMENT_OTHER): Payer: Self-pay

## 2021-06-12 ENCOUNTER — Other Ambulatory Visit: Payer: Self-pay | Admitting: Family

## 2021-06-13 ENCOUNTER — Other Ambulatory Visit (HOSPITAL_BASED_OUTPATIENT_CLINIC_OR_DEPARTMENT_OTHER): Payer: Self-pay

## 2021-06-13 MED FILL — Atorvastatin Calcium Tab 20 MG (Base Equivalent): ORAL | 60 days supply | Qty: 60 | Fill #0 | Status: CN

## 2021-06-21 ENCOUNTER — Other Ambulatory Visit (HOSPITAL_BASED_OUTPATIENT_CLINIC_OR_DEPARTMENT_OTHER): Payer: Self-pay

## 2021-07-01 ENCOUNTER — Ambulatory Visit (INDEPENDENT_AMBULATORY_CARE_PROVIDER_SITE_OTHER): Payer: BC Managed Care – PPO | Admitting: Neurology

## 2021-07-01 ENCOUNTER — Encounter: Payer: Self-pay | Admitting: Neurology

## 2021-07-01 ENCOUNTER — Other Ambulatory Visit (HOSPITAL_BASED_OUTPATIENT_CLINIC_OR_DEPARTMENT_OTHER): Payer: Self-pay

## 2021-07-01 VITALS — BP 154/99 | HR 101 | Ht 69.0 in | Wt 258.5 lb

## 2021-07-01 DIAGNOSIS — M5416 Radiculopathy, lumbar region: Secondary | ICD-10-CM

## 2021-07-01 DIAGNOSIS — M5431 Sciatica, right side: Secondary | ICD-10-CM

## 2021-07-01 DIAGNOSIS — G5710 Meralgia paresthetica, unspecified lower limb: Secondary | ICD-10-CM

## 2021-07-01 MED ORDER — DULOXETINE HCL 60 MG PO CPEP
60.0000 mg | ORAL_CAPSULE | Freq: Every day | ORAL | 3 refills | Status: AC
  Filled 2021-07-01: qty 90, 90d supply, fill #0

## 2021-07-01 MED ORDER — LAMOTRIGINE 25 MG PO TABS
ORAL_TABLET | ORAL | 5 refills | Status: DC
  Filled 2021-07-01: qty 120, 30d supply, fill #0

## 2021-07-01 MED ORDER — MODERNA COVID-19 BIVAL BOOSTER 50 MCG/0.5ML IM SUSP
INTRAMUSCULAR | 0 refills | Status: DC
  Filled 2021-07-01: qty 0.5, 1d supply, fill #0

## 2021-07-01 MED FILL — Atorvastatin Calcium Tab 20 MG (Base Equivalent): ORAL | 30 days supply | Qty: 30 | Fill #0 | Status: AC

## 2021-07-01 NOTE — Progress Notes (Signed)
[facility]  PATIENT: Sheri [name] DOB: 02-05-1976  REFERRING DOCTOR OR PCP: Durene Fruits, NP SOURCE: Patient, notes from primary care,  _________________________________   HISTORICAL  CHIEF COMPLAINT:  Chief Complaint  Patient presents with   New Patient (Initial Visit)    Rm 1, alone. Internal referral for neuropathy and bilateral sciatica. Pt has burning and numbness in legs and feet's. Worse in the morning, wakes up stiff. Started about a year ago and has worsened overtime. On Cymbalta, gabapentin didn't work.     HISTORY OF PRESENT ILLNESS:  I had the pleasure of seeing your patient, Sheri [name], at [facility] for neurologic consultation regarding her painful dysesthesias in the right leg.  She is a 45 yo woman with numbness and burning in her right leg.  The worse pain radiates from the back /outer part of the thigh to the back of the lower leg to the bottom of the oot.  Pain fluctuates and is worse with prolonged sitting   Layin on her right side increases the numbness,   Laying on her left side or back reduces the pain.  She denies weakness in her legs.  Se has noted more urinary frequency.     She was initially on gabapentin up to 600 mg tid without benefit.   More recently duloxetine wa added and 60 mg is helping some.     She has no recent imaging studies  She has diabetes, type II DM, hyperlipidemia.   She had COVID-19 last month  There are no lumbar spine imaging studies.  She does have a CT scan of the abdomen and the sagittal images show the lumbar spine.  I reviewed the imaging report and reviewed the images personally.  Normal disc space and no endplate spurring or significant facet hypertrophy.  No definite nerve root compression.  There looks to be disc bulge at L4-L5 and L5-S1 though this is difficult to be certain on the CT scan.  REVIEW OF SYSTEMS: Constitutional: No fevers, chills, sweats, or change in  appetite Eyes: No visual changes, double vision, eye pain Ear, nose and throat: No hearing loss, ear pain, nasal congestion, sore throat Cardiovascular: No chest pain, palpitations Respiratory:  No shortness of breath at rest or with exertion.   No wheezes GastrointestinaI: No nausea, vomiting, diarrhea, abdominal pain, fecal incontinence Genitourinary:  No dysuria, urinary retention or frequency.  No nocturia. Musculoskeletal: As above Integumentary: No rash, pruritus, skin lesions Neurological: as above Psychiatric: No depression at this time.  No anxiety Endocrine: No palpitations, diaphoresis, change in appetite, change in weigh or increased thirst.  She has diabetes mellitus Hematologic/Lymphatic:  No anemia, purpura, petechiae. Allergic/Immunologic: No itchy/runny eyes, nasal congestion, recent allergic reactions, rashes  ALLERGIES: Allergies  Allergen Reactions   Amoxicillin Hives and Rash   Iodine Anaphylaxis and Swelling   Penicillins Hives, Itching and Rash   Erythromycin Swelling and Other (See Comments)   Metformin Diarrhea    HOME MEDICATIONS:  Current Outpatient Medications:    albuterol (VENTOLIN HFA) 108 (90 Base) MCG/ACT inhaler, Inhale 2 puffs into the lungs every 6 (six) hours as needed for wheezing or shortness of breath., Disp: 8 g, Rfl: 0   Alcohol Swabs (ALCOHOL PREP) 70 % PADS, One injection weekly., Disp: 100 each, Rfl: 1   amLODipine (NORVASC) 10 MG tablet, Take 1 tablet (10 mg total) by mouth daily., Disp: 90 tablet, Rfl: 0   atorvastatin (LIPITOR) 20 MG tablet, Take 1 tablet (20 mg  total) by mouth daily., Disp: 60 tablet, Rfl: 0   Blood Glucose Monitoring Suppl (TRUE METRIX METER) w/Device KIT, Use as directed, Disp: 1 kit, Rfl: 0   Continuous Blood Gluc Sensor (FREESTYLE LIBRE 2 SENSOR) MISC, Use as directed to check blood sugar, Disp: 2 each, Rfl: 0   famotidine (PEPCID) 20 MG tablet, One after supper, Disp: 30 tablet, Rfl: 11   glucose blood (TRUE  METRIX BLOOD GLUCOSE TEST) test strip, Use as instructed, Disp: 100 each, Rfl: 12   influenza vac split quadrivalent PF (FLUARIX) 0.5 ML injection, Inject into the muscle., Disp: 0.5 mL, Rfl: 0   Insulin Pen Needle (BD PEN NEEDLE MICRO U/F) 32G X 6 MM MISC, One injection weekly., Disp: 100 each, Rfl: 1   olmesartan (BENICAR) 40 MG tablet, Take 1 tablet (40 mg total) by mouth daily., Disp: 90 tablet, Rfl: 0   pantoprazole (PROTONIX) 40 MG tablet, Take 1 tablet (40 mg total) by mouth daily., Disp: 30 tablet, Rfl: 1   TRUEplus Lancets 28G MISC, Use as directed, Disp: 100 each, Rfl: 4   DULoxetine (CYMBALTA) 60 MG capsule, Take 1 capsule (60 mg total) by mouth daily., Disp: 60 capsule, Rfl: 0   traZODone (DESYREL) 100 MG tablet, Take 1 tablet (100 mg total) by mouth at bedtime., Disp: 30 tablet, Rfl: 1  PAST MEDICAL HISTORY: Past Medical History:  Diagnosis Date   Diabetes mellitus without complication (HCC)    GERD (gastroesophageal reflux disease)    H/O neck surgery    ED records from 07/26/2020   Hypertension    Skin cancer     PAST SURGICAL HISTORY: Past Surgical History:  Procedure Laterality Date   BASAL CELL CARCINOMA EXCISION     ed records 07/26/2020   CERVICAL DISC SURGERY      FAMILY HISTORY: Family History  Problem Relation Age of Onset   Hypertension Mother    Diabetes Mother    Diabetes Father    Hypertension Father    Colon cancer Maternal Grandfather    Diabetes Paternal Grandmother    Rectal cancer Neg Hx    Stomach cancer Neg Hx    Esophageal cancer Neg Hx     SOCIAL HISTORY:  Social History   Socioeconomic History   Marital status: Single    Spouse name: Not on file   Number of children: Not on file   Years of education: Not on file   Highest education level: High school graduate  Occupational History   Not on file  Tobacco Use   Smoking status: Some Days    Packs/day: 0.50    Years: 22.00    Pack years: 11.00    Types: Cigarettes    Smokeless tobacco: Never   Tobacco comments:    4-5 cigs per day 11/08/20//lmr  Vaping Use   Vaping Use: Some days  Substance and Sexual Activity   Alcohol use: Yes    Comment: occasional   Drug use: Not Currently   Sexual activity: Never    Birth control/protection: None  Other Topics Concern   Not on file  Social History Narrative   ** Merged History Encounter **    Lives alone   Right handed   Caffeine: coffee every morning and tea.    Social Determinants of Health   Financial Resource Strain: Not on file  Food Insecurity: Not on file  Transportation Needs: Not on file  Physical Activity: Not on file  Stress: Not on file  Social Connections: Not on  file  Intimate Partner Violence: Not on file     PHYSICAL EXAM  Vitals:   07/01/21 1012  BP: (!) 154/99  Pulse: (!) 101  Weight: 258 lb 8 oz (117.3 kg)  Height: _0  (1.753 m)    Body mass index is 38.17 kg/m.   General: The patient is well-developed and well-nourished and in no acute distress  HEENT:  Head is [place]/AT.  Sclera are anicteric.  Funduscopic exam shows normal optic discs and retinal vessels.  Neck: No carotid bruits are noted.  The neck is nontender.  Cardiovascular: The heart has a regular rate and rhythm with a normal S1 and S2. There were no murmurs, gallops or rubs.    Skin: Extremities are without rash or  edema.  Musculoskeletal:  Back is nontender  Neurologic Exam  Mental status: The patient is alert and oriented x 3 at the time of the examination. The patient has apparent normal recent and remote memory, with an apparently normal attention span and concentration ability.   Speech is normal.  Cranial nerves: Extraocular movements are full. Pupils are equal, round, and reactive to light and accomodation.  Visual fields are full.  Facial symmetry is present. There is good facial sensation to soft touch bilaterally.Facial strength is normal.  Trapezius and sternocleidomastoid strength is  normal. No dysarthria is noted.  The tongue is midline, and the patient has symmetric elevation of the soft palate. No obvious hearing deficits are noted.  Motor:  Muscle bulk is normal.   Tone is normal. Strength is  5 / 5 in all 4 extremities.   On functinal tests had sligt right foot drop with heel stand/walk.   No problems with toe walk    Sensory: Sensory testing is intact to pinprick, soft touch and vibration sensation in all 4 extremities.  Coordination: Cerebellar testing reveals good finger-nose-finger and heel-to-shin bilaterally.  Gait and station: Station is normal.   Gait is normal. Tandem gait is normal. Romberg is negative.   Reflexes: Deep tendon reflexes are symmetric and normal bilaterally.   Plantar responses are flexor.    DIAGNOSTIC DATA (LABS, IMAGING, TESTING) - I reviewed patient records, labs, notes, testing and imaging myself where available.  Lab Results  Component Value Date   WBC 11.8 (H) 10/17/2020   HGB 14.7 10/17/2020   HCT 43.8 10/17/2020   MCV 86 10/17/2020   PLT 304 10/17/2020      Component Value Date/Time   NA 140 10/01/2020 1500   K 4.2 10/01/2020 1500   CL 101 10/01/2020 1500   CO2 23 10/01/2020 1500   GLUCOSE 107 (H) 10/01/2020 1500   BUN 12 10/01/2020 1500   CREATININE 0.78 10/01/2020 1500   CALCIUM 9.7 10/01/2020 1500   PROT 7.7 10/17/2020 1151   ALBUMIN 4.9 (H) 10/17/2020 1151   AST 28 10/17/2020 1151   ALT 30 10/17/2020 1151   ALKPHOS 105 10/17/2020 1151   BILITOT 0.5 10/17/2020 1151   GFRNONAA 93 10/01/2020 1500   GFRAA 107 10/01/2020 1500   Lab Results  Component Value Date   CHOL 276 (H) 10/17/2020   HDL 54 10/17/2020   LDLCALC 184 (H) 10/17/2020   TRIG 201 (H) 10/17/2020   CHOLHDL 5.1 (H) 10/17/2020   Lab Results  Component Value Date   HGBA1C 6.7 (A) 05/10/2021   No results found for: [id number] Lab Results  Component Value Date   TSH 0.560 10/17/2020       ASSESSMENT AND PLAN  Sciatica, right  side -  Plan: MR LUMBAR SPINE WO CONTRAST  Lumbar radiculopathy - Plan: MR LUMBAR SPINE WO CONTRAST  Meralgia paresthetica, unspecified laterality - Plan: DULoxetine (CYMBALTA) 60 MG capsule  In summary, Ms. Faciane is a 45 year old woman with pain going down the right leg.  The distribution is not completely dermatomal.  Although she has more pain on the lateral thigh, potentially concerning for meralgia paresthetica, she also has pain going further down the leg and the back and even to the bottom of the foot which would be more consistent with an S1 radiculopathy.  She had slight weakness with heel walking which could be seen with an L5 radiculopathy.  To further sort this out we will check an MRI of the lumbar spine.  Additionally I will start lamotrigine for the neuropathic component of the pain and titrate her up to 50 mg twice daily, and further depending on tolerability and results.  If the MRI does not show a source and she does not improve we will check an NCV/EMG study and consider different treatments.  she will see me back in 3 months or sooner if there are new or worsening neurologic symptoms.  Thank you for asking me to see Ms. Jamie.  Please let me know if I can be of further assistance with her or other patients in the future.   Cheyene Hamric A. Felecia Shelling, MD, [facility] [id number], 53:64 AM Certified in Neurology, Clinical Neurophysiology, Sleep Medicine and Neuroimaging  [facility] [address] [place], [place] [address] [phone]

## 2021-07-01 NOTE — Patient Instructions (Signed)
The pharmacy has the prescription of lamotrigine 25 mg. Please take it as follows: For 1 week take 1 pill once a day. The second week, take 1 pill twice a day. The third week, take 1 pill ithree times a day The fourth week take 2 pills twice a day.  Most people tolerate lamotrigine very well. Some will get a rash. If you do get a significant rash stop the medicine immediately and let us know.

## 2021-07-10 ENCOUNTER — Other Ambulatory Visit: Payer: Self-pay | Admitting: Neurology

## 2021-07-10 NOTE — Telephone Encounter (Signed)
LVM for pt to call back to schedule   [employer] auth: [id number] (exp. 07/10/21 to 09/07/21)

## 2021-07-10 NOTE — Telephone Encounter (Signed)
Patient returned my call she is scheduled at [facility] for 07/31/21.  She informed me she is claustrophobic and would need something to help her. She is aware to have a driver.

## 2021-07-10 NOTE — Addendum Note (Signed)
Addended by: Darleen Crocker on: 07/10/2021 03:07 PM   Modules accepted: Orders

## 2021-07-11 MED ORDER — ALPRAZOLAM 0.5 MG PO TABS: ORAL_TABLET | ORAL | 0 refills | Status: DC

## 2021-07-11 NOTE — Addendum Note (Signed)
Addended by: Arlice Colt A on: 07/11/2021 05:08 PM   Modules accepted: Orders

## 2021-07-20 NOTE — Progress Notes (Signed)
Patient ID: Sheri [name], female    DOB: November 02, 1975  MRN: [id number]  CC: Diabetes Follow-Up   Subjective: Sheri [name] is a 45 y.o. female who presents for diabetes follow-up.   Her concerns today include:   DIABETES TYPE 2 FOLLOW-UP: 05/03/2021: - Continue Dulaglutide as prescribed.  - Patient is intolerant to Metformin.  - Counseled to schedule visit for hemoglobin A1c check on next week. Follow-up with primary provider sooner if needed.   07/23/2021: Doing well on current regimen, no issues/concerns. Requesting Dexcom for home monitoring of blood sugars.   2. HYPERTENSION FOLLOW-UP: 03/04/2021: - Continue Amlodipine and Olmesartan as prescribed.  07/23/2021: Doing well on current regimen. No side effects. No issues/concerns. Denies chest pain and shortness of breath. Home blood pressures have been 927'G systolic. Reports her dentist is requesting completion of medical clearance paperwork related to elevated blood pressures prior to deep cleaning appointment.   3. HYPERLIPIDEMIA FOLLOW-UP: Doing well on Atorvastatin, no issues/concerns.   4. ACID REFLUX: Requesting refills of Protonix.  5. SMOKING CESSATION: Would like begin Wellbutrin for smoking cessation. Had in the past and did well. Reports took Chantix in the past and caused nightmares. Planning for weight loss surgery early January 2023 and one of the requirements is that she is a non-smoker prior to procedure.   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 8 g 0   Alcohol Swabs (ALCOHOL PREP) 70 % PADS One injection weekly. 100 each 1   ALPRAZolam (XANAX) 0.5 MG tablet May take 1 tablet 30 min prior to procedure and additional 1-2 tablet at time of procedure. Must have driver. 3 tablet 0   Blood Glucose Monitoring Suppl (TRUE METRIX METER) w/Device KIT Use as directed 1 kit 0   Continuous Blood Gluc Sensor (FREESTYLE LIBRE 2 SENSOR) MISC Use as directed to check blood sugar 2 each 0   COVID-19 mRNA bivalent vaccine, Moderna, (MODERNA COVID-19 BIVAL BOOSTER) 50 MCG/0.5ML injection Inject into the muscle. 0.5 mL 0   DULoxetine (CYMBALTA) 60 MG capsule Take 1 capsule (60 mg total) by mouth daily. 90 capsule 3   famotidine (PEPCID) 20 MG tablet One after supper 30 tablet 11   glucose blood (TRUE METRIX BLOOD GLUCOSE TEST) test strip Use as instructed 100 each 12   influenza vac split quadrivalent PF (FLUARIX) 0.5 ML injection Inject into the muscle. 0.5 mL 0   lamoTRIgine (LAMICTAL) 25 MG tablet Take 1 tablet by mouth daily for 1 week, 1 tab twice daily for 1 week, 1 tablet three times daily for 1 week, then 2 tabs twice daily 120 tablet 5   traZODone (DESYREL) 100 MG tablet Take 1 tablet (100 mg total) by mouth at bedtime. 30 tablet 1   TRUEplus Lancets 28G MISC Use as directed 100 each 4   No current facility-administered medications on file prior to visit.  Allergies  Allergen Reactions   Amoxicillin Hives and Rash   Iodine Anaphylaxis and Swelling   Penicillins Hives, Itching and Rash   Erythromycin Swelling and Other (See Comments)   Metformin Diarrhea    Social History   Socioeconomic History   Marital status: Single    Spouse name: Not on file   Number of children: Not on file   Years of education: Not on file   Highest education level: High school graduate   Occupational History   Not on file  Tobacco Use   Smoking status: Some Days    Packs/day: 0.50    Years: 22.00    Pack years: 11.00    Types: Cigarettes   Smokeless tobacco: Never   Tobacco comments:    4-5 cigs per day 11/08/20//lmr  Vaping Use   Vaping Use: Some days  Substance and Sexual Activity   Alcohol use: Yes    Comment: occasional   Drug use: Not Currently   Sexual activity: Never    Birth control/protection: None  Other Topics Concern   Not on file  Social History Narrative   ** Merged History Encounter **    Lives alone   Right handed   Caffeine: coffee every morning and tea.    Social Determinants of Health   Financial Resource Strain: Not on file  Food Insecurity: Not on file  Transportation Needs: Not on file  Physical Activity: Not on file  Stress: Not on file  Social Connections: Not on file  Intimate Partner Violence: Not on file    Family History  Problem Relation Age of Onset   Hypertension Mother    Diabetes Mother    Diabetes Father    Hypertension Father    Colon cancer Maternal Grandfather    Diabetes Paternal Grandmother    Rectal cancer Neg Hx    Stomach cancer Neg Hx    Esophageal cancer Neg Hx     Past Surgical History:  Procedure Laterality Date   BASAL CELL CARCINOMA EXCISION     ed records 07/26/2020   CERVICAL DISC SURGERY      ROS: Review of Systems Negative except as stated above  PHYSICAL EXAM: BP (!) 148/78 (BP Location: Left Arm, Patient Position: Sitting, Cuff Size: Large)   Pulse 87   Temp 98.3 F (36.8 C)   Resp 18   Ht 5' 7.99" (1.727 m)   Wt 258 lb (117 kg)   SpO2 100%   BMI 39.24 kg/m   Physical Exam HENT:     Head: Normocephalic and atraumatic.  Eyes:     Extraocular Movements: Extraocular movements intact.     Conjunctiva/sclera: Conjunctivae normal.     Pupils: Pupils are equal, round, and reactive to light.  Cardiovascular:     Rate and Rhythm: Normal rate and regular rhythm.     Pulses:  Normal pulses.     Heart sounds: Normal heart sounds.  Pulmonary:     Effort: Pulmonary effort is normal.     Breath sounds: Normal breath sounds.  Musculoskeletal:     Cervical back: Normal range of motion and neck supple.  Neurological:     General: No focal deficit present.     Mental Status: She is alert and oriented to person, place, and time.  Psychiatric:        Mood and Affect: Mood normal.        Behavior: Behavior normal.    Results for orders placed or performed in visit on  07/23/21  POCT glycosylated hemoglobin (Hb A1C)  Result Value Ref Range   Hemoglobin A1C 6.6 (A) 4.0 - 5.6 %   HbA1c POC (<> result, manual entry)     HbA1c, POC (prediabetic range)     HbA1c, POC (controlled diabetic range)      ASSESSMENT AND PLAN: 1. Type 2 diabetes mellitus with diabetic polyneuropathy, without long-term current use of insulin ([place]): - Hemoglobin A1c today at goal at 6.6%, goal < 7%. This is improved from previous 7.0% on 03/04/2021. - Continue Dulaglutide as prescribed.  - Will change blood glucose monitor to Dexcom per patient request.  - Discussed the importance of healthy eating habits, low-carbohydrate diet, low-sugar diet, regular aerobic exercise (at least 150 minutes a week as tolerated) and medication compliance to achieve or maintain control of diabetes. - Follow-up with primary provider in 3 months or sooner if needed. - POCT glycosylated hemoglobin (Hb A1C) - Continuous Blood Gluc Sensor (DEXCOM G6 SENSOR) MISC; 3 each by Does not apply route 4 (four) times daily -  before meals and at bedtime.  Dispense: 3 each; Refill: 8 - Continuous Blood Gluc Transmit (DEXCOM G6 TRANSMITTER) MISC; 1 each by Does not apply route 4 (four) times daily -  before meals and at bedtime.  Dispense: 1 each; Refill: 3 - Dulaglutide 0.75 MG/0.5ML SOPN; Inject 0.75 mg into the skin once a week.  Dispense: 6 mL; Refill: 0 - Insulin Pen Needle (BD PEN NEEDLE MICRO U/F) 32G X 6 MM MISC; One  injection weekly.  Dispense: 100 each; Refill: 0  2. Essential hypertension: - Blood pressure not at goal during today's visit. Patient asymptomatic without chest pressure, chest pain, palpitations, shortness of breath, worst headache of life, and any additional red flag symptoms. - Continue Olmesartan and Amlodipine as prescribed.  - Begin Hydrochlorothiazide as prescribed.  - Counseled on blood pressure goal of less than 130/80, low-sodium, DASH diet, medication compliance, 150 minutes of moderate intensity exercise per week as tolerated. Discussed medication compliance, adverse effects. - Repeat BMP today.  - Follow-up with primary provider in 2 weeks or sooner if needed. Will evaluate if blood pressure appropriate for dentist appointment at that time.  - Basic Metabolic Panel - olmesartan (BENICAR) 40 MG tablet; Take 1 tablet (40 mg total) by mouth daily.  Dispense: 90 tablet; Refill: 0 - amLODipine (NORVASC) 10 MG tablet; Take 1 tablet (10 mg total) by mouth daily.  Dispense: 90 tablet; Refill: 0 - hydrochlorothiazide (HYDRODIURIL) 12.5 MG tablet; Take 1 tablet (12.5 mg total) by mouth daily.  Dispense: 30 tablet; Refill: 0  3. Hyperlipidemia, unspecified hyperlipidemia type: - Practice low-fat heart healthy diet and at least 150 minutes of moderate intensity exercise weekly as tolerated.  - Continue Atorvastatin as prescribed.  - Follow-up with primary provider as scheduled. - atorvastatin (LIPITOR) 20 MG tablet; Take 1 tablet (20 mg total) by mouth daily.  Dispense: 120 tablet; Refill: 0  4. Gastroesophageal reflux disease without esophagitis: - Continue Pantoprazole as prescribed.  - Follow-up with primary provider as scheduled.  - pantoprazole (PROTONIX) 40 MG tablet; Take 1 tablet (40 mg total) by mouth daily.  Dispense: 90 tablet; Refill: 0  5. Encounter for smoking cessation counseling: - Begin Bupropion as prescribed. Counseled on medication compliance and adverse effects. -  Follow-up with primary provider in 4 weeks or sooner if needed.  - buPROPion (WELLBUTRIN SR) 150 MG 12 hr tablet; Take 1 tablet (150 mg total) by mouth daily for 3 days, THEN 1  tablet (150 mg total) 2 (two) times daily for 27 days.  Dispense: 57 tablet; Refill: 0  6. Need for Tdap vaccination: - Administered today in office.  - Tdap vaccine greater than or equal to 7yo IM  Patient was given the opportunity to ask questions.  Patient verbalized understanding of the plan and was able to repeat key elements of the plan. Patient was given clear instructions to go to Emergency Department or return to medical center if symptoms don't improve, worsen, or new problems develop.The patient verbalized understanding.   Orders Placed This Encounter  Procedures   Basic Metabolic Panel   POCT glycosylated hemoglobin (Hb A1C)     Requested Prescriptions   Signed Prescriptions Disp Refills   Continuous Blood Gluc Sensor (DEXCOM G6 SENSOR) MISC 3 each 8    Sig: 3 each by Does not apply route 4 (four) times daily -  before meals and at bedtime.   Continuous Blood Gluc Transmit (DEXCOM G6 TRANSMITTER) MISC 1 each 3    Sig: 1 each by Does not apply route 4 (four) times daily -  before meals and at bedtime.   buPROPion (WELLBUTRIN SR) 150 MG 12 hr tablet 57 tablet 0    Sig: Take 1 tablet (150 mg total) by mouth daily for 3 days, THEN 1 tablet (150 mg total) 2 (two) times daily for 27 days.   pantoprazole (PROTONIX) 40 MG tablet 90 tablet 0    Sig: Take 1 tablet (40 mg total) by mouth daily.   Dulaglutide 0.75 MG/0.5ML SOPN 6 mL 0    Sig: Inject 0.75 mg into the skin once a week.   Insulin Pen Needle (BD PEN NEEDLE MICRO U/F) 32G X 6 MM MISC 100 each 0    Sig: One injection weekly.   olmesartan (BENICAR) 40 MG tablet 90 tablet 0    Sig: Take 1 tablet (40 mg total) by mouth daily.   amLODipine (NORVASC) 10 MG tablet 90 tablet 0    Sig: Take 1 tablet (10 mg total) by mouth daily.   hydrochlorothiazide  (HYDRODIURIL) 12.5 MG tablet 30 tablet 0    Sig: Take 1 tablet (12.5 mg total) by mouth daily.   atorvastatin (LIPITOR) 20 MG tablet 120 tablet 0    Sig: Take 1 tablet (20 mg total) by mouth daily.    Return in about 3 months (around 10/21/2021) for Follow-Up or next available diabetes and 4 weeks smoking cessation.  Camillia Herter, NP

## 2021-07-23 ENCOUNTER — Other Ambulatory Visit (HOSPITAL_BASED_OUTPATIENT_CLINIC_OR_DEPARTMENT_OTHER): Payer: Self-pay

## 2021-07-23 ENCOUNTER — Other Ambulatory Visit: Payer: Self-pay

## 2021-07-23 ENCOUNTER — Ambulatory Visit (INDEPENDENT_AMBULATORY_CARE_PROVIDER_SITE_OTHER): Payer: BC Managed Care – PPO | Admitting: Family

## 2021-07-23 ENCOUNTER — Encounter: Payer: Self-pay | Admitting: Family

## 2021-07-23 VITALS — BP 148/78 | HR 87 | Temp 98.3°F | Resp 18 | Ht 67.99 in | Wt 258.0 lb

## 2021-07-23 DIAGNOSIS — Z23 Encounter for immunization: Secondary | ICD-10-CM

## 2021-07-23 DIAGNOSIS — F17210 Nicotine dependence, cigarettes, uncomplicated: Secondary | ICD-10-CM

## 2021-07-23 DIAGNOSIS — Z716 Tobacco abuse counseling: Secondary | ICD-10-CM

## 2021-07-23 DIAGNOSIS — E1142 Type 2 diabetes mellitus with diabetic polyneuropathy: Secondary | ICD-10-CM | POA: Diagnosis not present

## 2021-07-23 DIAGNOSIS — E785 Hyperlipidemia, unspecified: Secondary | ICD-10-CM | POA: Diagnosis not present

## 2021-07-23 DIAGNOSIS — I10 Essential (primary) hypertension: Secondary | ICD-10-CM | POA: Diagnosis not present

## 2021-07-23 DIAGNOSIS — K219 Gastro-esophageal reflux disease without esophagitis: Secondary | ICD-10-CM | POA: Diagnosis not present

## 2021-07-23 LAB — POCT GLYCOSYLATED HEMOGLOBIN (HGB A1C): Hemoglobin A1C: 6.6 % — AB (ref 4.0–5.6)

## 2021-07-23 MED ORDER — AMLODIPINE BESYLATE 10 MG PO TABS: 10.0000 mg | ORAL_TABLET | Freq: Every day | ORAL | 0 refills | Status: DC

## 2021-07-23 MED ORDER — DEXCOM G6 SENSOR MISC
3.0000 | Freq: Three times a day (TID) | 8 refills | Status: DC
  Filled 2021-07-24: qty 3, 30d supply, fill #0

## 2021-07-23 MED ORDER — OLMESARTAN MEDOXOMIL 40 MG PO TABS: 40.0000 mg | ORAL_TABLET | Freq: Every day | ORAL | 0 refills | Status: DC

## 2021-07-23 MED ORDER — HYDROCHLOROTHIAZIDE 12.5 MG PO TABS
12.5000 mg | ORAL_TABLET | Freq: Every day | ORAL | 0 refills | Status: DC
  Filled 2021-07-24: qty 30, 30d supply, fill #0

## 2021-07-23 MED ORDER — DEXCOM G6 TRANSMITTER MISC
1.0000 | Freq: Three times a day (TID) | 3 refills | Status: DC
  Filled 2021-07-24: qty 1, 30d supply, fill #0

## 2021-07-23 MED ORDER — PANTOPRAZOLE SODIUM 40 MG PO TBEC: 40.0000 mg | DELAYED_RELEASE_TABLET | Freq: Every day | ORAL | 0 refills | Status: AC

## 2021-07-23 MED ORDER — BD PEN NEEDLE MICRO U/F 32G X 6 MM MISC: 0 refills | Status: DC

## 2021-07-23 MED ORDER — ATORVASTATIN CALCIUM 20 MG PO TABS: 20.0000 mg | ORAL_TABLET | Freq: Every day | ORAL | 0 refills | Status: DC

## 2021-07-23 MED ORDER — BUPROPION HCL ER (SR) 150 MG PO TB12
ORAL_TABLET | ORAL | 0 refills | Status: DC
  Filled 2021-07-24: qty 57, 30d supply, fill #0

## 2021-07-23 MED ORDER — DULAGLUTIDE 0.75 MG/0.5ML ~~LOC~~ SOAJ: 0.7500 mg | SUBCUTANEOUS | 0 refills | Status: DC

## 2021-07-23 NOTE — Progress Notes (Signed)
Pt presents for diabetes, states she would like change from [facility] to [place],  Wants smoking cessation meds wants to start back on Wellbutrin  Refills on Protonix and Trulicity

## 2021-07-23 NOTE — Patient Instructions (Signed)
Bupropion Sustained-Release Tablets (Smoking Cessation) What is this medication? BUPROPION (byoo PROE pee on) helps you quit smoking. It reduces cravings for nicotine, the addictive substance found in tobacco. It may also help reduce symptoms of withdrawal. It is most effective when used in combination with a stop-smoking program. This medicine may be used for other purposes; ask your health care provider or pharmacist if you have questions. COMMON BRAND NAME(S): Buproban, Zyban What should I tell my care team before I take this medication? They need to know if you have any of these conditions: An eating disorder, such as anorexia or bulimia Bipolar disorder or psychosis Diabetes or high blood sugar, treated with medication Glaucoma Head injury or brain tumor Heart disease, previous heart attack, or irregular heart beat High blood pressure Kidney or liver disease Seizures Suicidal thoughts or a previous suicide attempt Tourette's syndrome Weight loss An unusual or allergic reaction to bupropion, other medications, foods, dyes, or preservatives Pregnant or trying to become pregnant Breast-feeding How should I use this medication? Take this medication by mouth with a glass of water. Follow the directions on the prescription label. You can take it with or without food. If it upsets your stomach, take it with food. Do not cut, crush or chew this medication. Take your medication at regular intervals. If you take this medication more than once a day, take your second dose at least 8 hours after you take your first dose. To limit trouble in sleeping, avoid taking this medication at bedtime. Do not take your medication more often than directed. Do not stop taking this medication suddenly except upon the advice of your care team. Stopping this medication too quickly may cause serious side effects. A special MedGuide will be given to you by the pharmacist with each prescription and refill. Be sure to  read this information carefully each time. Talk to your care team about the use of this medication in children. Special care may be needed. Overdosage: If you think you have taken too much of this medicine contact a poison control center or emergency room at once. NOTE: This medicine is only for you. Do not share this medicine with others. What if I miss a dose? If you miss a dose, skip the missed dose and take your next tablet at the regular time. There should be at least 8 hours between doses. Do not take double or extra doses. What may interact with this medication? Do not take this medication with any of the following: Linezolid MAOIs like Azilect, Carbex, Eldepryl, Marplan, Nardil, and Parnate Methylene blue (injected into a vein) Other medications that contain bupropion like Wellbutrin This medication may also interact with the following: Alcohol Certain medications for anxiety or sleep Certain medications for blood pressure like metoprolol, propranolol Certain medications for depression or psychotic disturbances Certain medications for HIV or AIDS like efavirenz, lopinavir, nelfinavir, ritonavir Certain medications for irregular heart beat like propafenone, flecainide Certain medications for Parkinson's disease like amantadine, levodopa Certain medications for seizures like carbamazepine, phenytoin, phenobarbital Cimetidine Clopidogrel Cyclophosphamide Digoxin Furazolidone Isoniazid Nicotine Orphenadrine Procarbazine Steroid medications like prednisone or cortisone Stimulant medications for attention disorders, weight loss, or to stay awake Tamoxifen Theophylline Thiotepa Ticlopidine Tramadol Warfarin This list may not describe all possible interactions. Give your health care provider a list of all the medicines, herbs, non-prescription drugs, or dietary supplements you use. Also tell them if you smoke, drink alcohol, or use illegal drugs. Some items may interact with  your medicine. What should I  watch for while using this medication? Visit your care team for regular checks on your progress. This medication should be used together with a patient support program. It is important to participate in a behavioral program, counseling, or other support program that is recommended by your care team. Watch for new or worsening thoughts of suicide or depression. This includes sudden changes in mood, behavior, or thoughts. These changes can happen at any time but are more common in the beginning of treatment or after a change in dose. Call your care team right away if you experience these thoughts or worsening depression. Manic episodes may happen in patients with bipolar disorder who take this medication. Watch for changes in feelings or behaviors such as feeling anxious, nervous, agitated, panicky, irritable, hostile, aggressive, impulsive, severely restless, overly excited and hyperactive, or trouble sleeping. These symptoms can happen at anytime but are more common in the beginning of treatment or after a change in dose. Call your care team right away if you notice any of these symptoms. This medication may cause serious skin reactions. They can happen weeks to months after starting the medication. Contact your care team right away if you notice fevers or flu-like symptoms with a rash. The rash may be red or purple and then turn into blisters or peeling of the skin. Or, you might notice a red rash with swelling of the face, lips or lymph nodes in your neck or under your arms. Avoid drinks that contain alcohol while taking this medication. Drinking large amounts of alcohol, using sleeping or anxiety medications, or quickly stopping the use of these agents while taking this medication may increase your risk for a seizure. Do not drive or use heavy machinery until you know how this medication affects you. This medication can impair your ability to perform these tasks. Do not take  this medication close to bedtime. It may prevent you from sleeping. Your mouth may get dry. Chewing sugarless gum or sucking hard candy, and drinking plenty of water may help. Contact your care team if the problem does not go away or is severe. Do not use nicotine patches or chewing gum without the advice of your care team while taking this medication. You may need to have your blood pressure taken regularly if your doctor recommends that you use both nicotine and this medication together. What side effects may I notice from receiving this medication? Side effects that you should report to your care team as soon as possible: Allergic reactions--skin rash, itching, hives, swelling of the face, lips, tongue, or throat Increase in blood pressure Mood and behavior changes--anxiety, nervousness, confusion, hallucinations, irritability, hostility, thoughts of suicide or self-harm, worsening mood, feelings of depression Redness, blistering, peeling, or loosening of the skin, including inside the mouth Seizures Sudden eye pain or change in vision such as blurry vision, seeing halos around lights, vision loss Side effects that usually do not require medical attention (report to your care team if they continue or are bothersome): Constipation Dizziness Dry mouth Loss of appetite Nausea Tremors or shaking Trouble sleeping This list may not describe all possible side effects. Call your doctor for medical advice about side effects. You may report side effects to FDA at 1-800-FDA-1088. Where should I keep my medication? Keep out of the reach of children and pets. Store at room temperature between 20 and 25 degrees C (68 and 77 degrees F). Protect from light. Keep container tightly closed. Throw away any unused medication after the expiration date. NOTE:  This sheet is a summary. It may not cover all possible information. If you have questions about this medicine, talk to your doctor, pharmacist, or health  care provider.  2022 Elsevier/Gold Standard (2020-10-10 00:00:00)

## 2021-07-23 NOTE — Progress Notes (Signed)
Diabetes discussed in office. Great work at keeping diabetes at goal!

## 2021-07-24 ENCOUNTER — Other Ambulatory Visit (HOSPITAL_BASED_OUTPATIENT_CLINIC_OR_DEPARTMENT_OTHER): Payer: Self-pay

## 2021-07-24 ENCOUNTER — Other Ambulatory Visit: Payer: Self-pay | Admitting: Family

## 2021-07-24 ENCOUNTER — Encounter: Payer: Self-pay | Admitting: Family

## 2021-07-24 ENCOUNTER — Telehealth: Payer: Self-pay | Admitting: Family

## 2021-07-24 DIAGNOSIS — E119 Type 2 diabetes mellitus without complications: Secondary | ICD-10-CM

## 2021-07-24 LAB — BASIC METABOLIC PANEL
BUN/Creatinine Ratio: 11 (ref 9–23)
BUN: 12 mg/dL (ref 6–24)
CO2: 24 mmol/L (ref 20–29)
Calcium: 9.9 mg/dL (ref 8.7–10.2)
Chloride: 104 mmol/L (ref 96–106)
Creatinine, Ser: 1.05 mg/dL — ABNORMAL HIGH (ref 0.57–1.00)
Glucose: 128 mg/dL — ABNORMAL HIGH (ref 70–99)
Potassium: 3.9 mmol/L (ref 3.5–5.2)
Sodium: 145 mmol/L — ABNORMAL HIGH (ref 134–144)
eGFR: 67 mL/min/{1.73_m2} (ref >59)

## 2021-07-24 NOTE — Progress Notes (Signed)
Kidney function normal

## 2021-07-24 NOTE — Telephone Encounter (Signed)
Pt needs to know if she is supposed to take Trulicity or not. Please advise and thank you

## 2021-07-25 ENCOUNTER — Other Ambulatory Visit: Payer: Self-pay

## 2021-07-25 ENCOUNTER — Other Ambulatory Visit (HOSPITAL_BASED_OUTPATIENT_CLINIC_OR_DEPARTMENT_OTHER): Payer: Self-pay

## 2021-07-25 DIAGNOSIS — E1142 Type 2 diabetes mellitus with diabetic polyneuropathy: Secondary | ICD-10-CM

## 2021-07-25 MED ORDER — DULAGLUTIDE 0.75 MG/0.5ML ~~LOC~~ SOAJ
0.7500 mg | SUBCUTANEOUS | 0 refills | Status: DC
  Filled 2021-07-25: qty 6, 84d supply, fill #0
  Filled 2021-08-09: qty 6, 90d supply, fill #0
  Filled 2021-09-09: qty 6, 84d supply, fill #0

## 2021-07-26 ENCOUNTER — Ambulatory Visit: Payer: BC Managed Care – PPO | Admitting: Family

## 2021-07-31 ENCOUNTER — Other Ambulatory Visit: Payer: BC Managed Care – PPO

## 2021-08-02 ENCOUNTER — Other Ambulatory Visit (HOSPITAL_BASED_OUTPATIENT_CLINIC_OR_DEPARTMENT_OTHER): Payer: Self-pay

## 2021-08-04 ENCOUNTER — Telehealth: Payer: BC Managed Care – PPO | Admitting: Nurse Practitioner

## 2021-08-04 DIAGNOSIS — J0140 Acute pansinusitis, unspecified: Secondary | ICD-10-CM | POA: Diagnosis not present

## 2021-08-04 DIAGNOSIS — J40 Bronchitis, not specified as acute or chronic: Secondary | ICD-10-CM

## 2021-08-04 DIAGNOSIS — J028 Acute pharyngitis due to other specified organisms: Secondary | ICD-10-CM | POA: Diagnosis not present

## 2021-08-04 MED ORDER — PREDNISONE 20 MG PO TABS: 20.0000 mg | ORAL_TABLET | Freq: Two times a day (BID) | ORAL | 0 refills | Status: AC

## 2021-08-04 MED ORDER — DOXYCYCLINE HYCLATE 100 MG PO TABS: 100.0000 mg | ORAL_TABLET | Freq: Two times a day (BID) | ORAL | 0 refills | Status: AC

## 2021-08-04 MED ORDER — ALBUTEROL SULFATE HFA 108 (90 BASE) MCG/ACT IN AERS: 2.0000 | INHALATION_SPRAY | Freq: Four times a day (QID) | RESPIRATORY_TRACT | 0 refills | Status: DC | PRN

## 2021-08-04 NOTE — Progress Notes (Signed)
Virtual Visit Consent   Sheri [name], you are scheduled for a virtual visit with a [facility] provider today.     Just as with appointments in the office, your consent must be obtained to participate.  Your consent will be active for this visit and any virtual visit you may have with one of our providers in the next 365 days.     If you have a MyChart account, a copy of this consent can be sent to you electronically.  All virtual visits are billed to your insurance company just like a traditional visit in the office.    As this is a virtual visit, video technology does not allow for your provider to perform a traditional examination.  This may limit your provider's ability to fully assess your condition.  If your provider identifies any concerns that need to be evaluated in person or the need to arrange testing (such as labs, EKG, etc.), we will make arrangements to do so.     Although advances in technology are sophisticated, we cannot ensure that it will always work on either your end or our end.  If the connection with a video visit is poor, the visit may have to be switched to a telephone visit.  With either a video or telephone visit, we are not always able to ensure that we have a secure connection.     I need to obtain your verbal consent now.   Are you willing to proceed with your visit today?    Sheri [name] has provided verbal consent on 08/04/2021 for a virtual visit (video or telephone).   Sheri Schneiders, FNP   Date: 08/04/2021 1:29 PM   Virtual Visit via Video Note   I, Sheri [name], connected with  Sheri [name]  ([id number], 1975-12-15) on 08/04/21 at  1:30 PM EST by a video-enabled telemedicine application and verified that I am speaking with the correct person using two identifiers.  Location: Patient: Virtual Visit Location Patient: Home Provider: Virtual Visit Location Provider: Home Office   I discussed the limitations of evaluation and management by  telemedicine and the availability of in person appointments. The patient expressed understanding and agreed to proceed.    History of Present Illness: Sheri [name] is a 45 y.o. who identifies as a female who was assigned female at birth, and is being seen today with complaints of ongoing upper respiratory symptoms and a very sore throat.  She has ringing in her ears Started with vomiting 4 days ago that resolved within 24 hours.   Denies a fever.   She has been able to drink and is staying hydrated.  She is taking Emergency Robitussin  Green tea with lemon   She has been eating small meals in the past 24 hours.   She did initially have diarrhea as well that has resolved.   She did take a home COVID test 24 hours after symptom onset and it was negative.   Her worst symptom today is sore throat.  She did have smaller children living with her up until this week unknown illnesses   She has had a flu vaccine.   She does get bronchitis annually, she has had cough and wheezing without production of mucous with cough.   She does have an albuterol inhaler that she has been using for relief.   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   Plantar fasciitis 03/03/2019    Allergies:  Allergies  Allergen Reactions   Amoxicillin Hives and Rash   Iodine Anaphylaxis and Swelling   Penicillins Hives, Itching and Rash   Erythromycin Swelling and Other (See Comments)   Metformin Diarrhea   Medications:  Current Outpatient Medications:    albuterol (VENTOLIN HFA) 108 (90 Base) MCG/ACT inhaler, Inhale 2 puffs into the lungs  every 6 (six) hours as needed for wheezing or shortness of breath., Disp: 8 g, Rfl: 0   Alcohol Swabs (ALCOHOL PREP) 70 % PADS, One injection weekly., Disp: 100 each, Rfl: 1   ALPRAZolam (XANAX) 0.5 MG tablet, May take 1 tablet 30 min prior to procedure and additional 1-2 tablet at time of procedure. Must have driver., Disp: 3 tablet, Rfl: 0   amLODipine (NORVASC) 10 MG tablet, Take 1 tablet (10 mg total) by mouth daily., Disp: 90 tablet, Rfl: 0   atorvastatin (LIPITOR) 20 MG tablet, Take 1 tablet (20 mg total) by mouth daily., Disp: 120 tablet, Rfl: 0   Blood Glucose Monitoring Suppl (TRUE METRIX METER) w/Device KIT, Use as directed, Disp: 1 kit, Rfl: 0   buPROPion (WELLBUTRIN SR) 150 MG 12 hr tablet, Take 1 tablet (150 mg total) by mouth daily for 3 days, THEN 1 tablet (150 mg total) 2 (two) times daily for 27 days., Disp: 57 tablet, Rfl: 0   Continuous Blood Gluc Sensor (DEXCOM G6 SENSOR) MISC, Use to check blood sugar 4 (four) times daily -  before meals and at bedtime., Disp: 3 each, Rfl: 8   Continuous Blood Gluc Sensor (FREESTYLE LIBRE 2 SENSOR) MISC, Use as directed to check blood sugar, Disp: 2 each, Rfl: 0   Continuous Blood Gluc Transmit (DEXCOM G6 TRANSMITTER) MISC, Use to check blood sugar  4 (four) times daily -  before meals and at bedtime., Disp: 1 each, Rfl: 3   COVID-19 mRNA bivalent vaccine, Moderna, (MODERNA COVID-19 BIVAL BOOSTER) 50 MCG/0.5ML injection, Inject into the muscle., Disp: 0.5 mL, Rfl: 0   Dulaglutide 0.75 MG/0.5ML SOPN, Inject 0.75 mg into the skin once a week., Disp: 6 mL, Rfl: 0   DULoxetine (CYMBALTA) 60 MG capsule, Take 1 capsule (60 mg total) by mouth daily., Disp: 90 capsule, Rfl: 3   famotidine (PEPCID) 20 MG tablet, One after supper, Disp: 30 tablet, Rfl: 11   glucose blood (TRUE METRIX BLOOD GLUCOSE TEST) test strip, Use as instructed, Disp: 100 each, Rfl: 12   hydrochlorothiazide (HYDRODIURIL) 12.5 MG tablet, Take 1 tablet (12.5 mg total) by mouth daily.,  Disp: 30 tablet, Rfl: 0   influenza vac split quadrivalent PF (FLUARIX) 0.5 ML injection, Inject into the muscle., Disp: 0.5 mL, Rfl: 0   Insulin Pen Needle (BD PEN NEEDLE MICRO U/F) 32G X 6 MM MISC, One injection weekly., Disp: 100 each, Rfl: 0   lamoTRIgine (LAMICTAL) 25 MG tablet, Take 1 tablet by mouth daily for 1 week, 1 tab twice daily for 1 week, 1 tablet three times daily for 1 week, then 2 tabs twice daily, Disp: 120 tablet, Rfl: 5   olmesartan (BENICAR) 40 MG tablet, Take 1 tablet (40 mg total) by mouth daily., Disp: 90 tablet, Rfl: 0   pantoprazole (PROTONIX) 40 MG tablet, Take  1 tablet (40 mg total) by mouth daily., Disp: 90 tablet, Rfl: 0   traZODone (DESYREL) 100 MG tablet, Take 1 tablet (100 mg total) by mouth at bedtime., Disp: 30 tablet, Rfl: 1   TRUEplus Lancets 28G MISC, Use as directed, Disp: 100 each, Rfl: 4  Observations/Objective: Patient is well-developed, well-nourished in no acute distress.  Resting comfortably at home.  Head is normocephalic, atraumatic.  No labored breathing.  Speech is clear and coherent with logical content.  Patient is alert and oriented at baseline.    Assessment and Plan: 1. Bronchitis  - predniSONE (DELTASONE) 20 MG tablet; Take 1 tablet (20 mg total) by mouth 2 (two) times daily with a meal for 5 days.  Dispense: 10 tablet; Refill: 0 - albuterol (VENTOLIN HFA) 108 (90 Base) MCG/ACT inhaler; Inhale 2 puffs into the lungs every 6 (six) hours as needed for wheezing or shortness of breath.  Dispense: 8 g; Refill: 0  2. Pharyngitis due to other organism Warm salt water gargles,  Over the counter decongestant for PND relief.   3. Acute non-recurrent pansinusitis  - doxycycline (VIBRA-TABS) 100 MG tablet; Take 1 tablet (100 mg total) by mouth 2 (two) times daily for 10 days. Take with food  Dispense: 20 tablet; Refill: 0     Follow Up Instructions: I discussed the assessment and treatment plan with the patient. The patient was provided an  opportunity to ask questions and all were answered. The patient agreed with the plan and demonstrated an understanding of the instructions.  A copy of instructions were sent to the patient via MyChart unless otherwise noted below.     The patient was advised to call back or seek an in-person evaluation if the symptoms worsen or if the condition fails to improve as anticipated.  Time:  I spent 15 minutes with the patient via telehealth technology discussing the above problems/concerns.    Sheri Schneiders, FNP

## 2021-08-09 ENCOUNTER — Other Ambulatory Visit (HOSPITAL_BASED_OUTPATIENT_CLINIC_OR_DEPARTMENT_OTHER): Payer: Self-pay

## 2021-08-09 ENCOUNTER — Encounter (HOSPITAL_BASED_OUTPATIENT_CLINIC_OR_DEPARTMENT_OTHER): Payer: Self-pay | Admitting: Emergency Medicine

## 2021-08-09 ENCOUNTER — Ambulatory Visit (INDEPENDENT_AMBULATORY_CARE_PROVIDER_SITE_OTHER): Payer: BC Managed Care – PPO | Admitting: Family

## 2021-08-09 ENCOUNTER — Encounter: Payer: Self-pay | Admitting: Family

## 2021-08-09 ENCOUNTER — Emergency Department (HOSPITAL_BASED_OUTPATIENT_CLINIC_OR_DEPARTMENT_OTHER): Payer: BC Managed Care – PPO

## 2021-08-09 ENCOUNTER — Other Ambulatory Visit: Payer: Self-pay

## 2021-08-09 ENCOUNTER — Emergency Department (HOSPITAL_BASED_OUTPATIENT_CLINIC_OR_DEPARTMENT_OTHER)
Admission: EM | Admit: 2021-08-09 | Discharge: 2021-08-09 | Disposition: A | Discharge status: Home / Self Care | Payer: BC Managed Care – PPO | Attending: Emergency Medicine | Admitting: Emergency Medicine

## 2021-08-09 DIAGNOSIS — Z79899 Other long term (current) drug therapy: Secondary | ICD-10-CM | POA: Diagnosis not present

## 2021-08-09 DIAGNOSIS — R0602 Shortness of breath: Secondary | ICD-10-CM | POA: Diagnosis not present

## 2021-08-09 DIAGNOSIS — Z794 Long term (current) use of insulin: Secondary | ICD-10-CM | POA: Insufficient documentation

## 2021-08-09 DIAGNOSIS — I10 Essential (primary) hypertension: Secondary | ICD-10-CM | POA: Diagnosis not present

## 2021-08-09 DIAGNOSIS — J45909 Unspecified asthma, uncomplicated: Secondary | ICD-10-CM | POA: Insufficient documentation

## 2021-08-09 DIAGNOSIS — Z20822 Contact with and (suspected) exposure to covid-19: Secondary | ICD-10-CM | POA: Insufficient documentation

## 2021-08-09 DIAGNOSIS — E1142 Type 2 diabetes mellitus with diabetic polyneuropathy: Secondary | ICD-10-CM | POA: Diagnosis not present

## 2021-08-09 DIAGNOSIS — R0981 Nasal congestion: Secondary | ICD-10-CM | POA: Diagnosis not present

## 2021-08-09 DIAGNOSIS — J40 Bronchitis, not specified as acute or chronic: Secondary | ICD-10-CM | POA: Diagnosis not present

## 2021-08-09 DIAGNOSIS — J069 Acute upper respiratory infection, unspecified: Secondary | ICD-10-CM | POA: Diagnosis not present

## 2021-08-09 DIAGNOSIS — B9789 Other viral agents as the cause of diseases classified elsewhere: Secondary | ICD-10-CM | POA: Diagnosis not present

## 2021-08-09 DIAGNOSIS — R058 Other specified cough: Secondary | ICD-10-CM

## 2021-08-09 DIAGNOSIS — F17210 Nicotine dependence, cigarettes, uncomplicated: Secondary | ICD-10-CM | POA: Insufficient documentation

## 2021-08-09 DIAGNOSIS — R0600 Dyspnea, unspecified: Secondary | ICD-10-CM | POA: Diagnosis not present

## 2021-08-09 DIAGNOSIS — Z85828 Personal history of other malignant neoplasm of skin: Secondary | ICD-10-CM | POA: Diagnosis not present

## 2021-08-09 DIAGNOSIS — R059 Cough, unspecified: Secondary | ICD-10-CM | POA: Diagnosis not present

## 2021-08-09 HISTORY — DX: Unspecified asthma, uncomplicated: J45.909

## 2021-08-09 LAB — RESP PANEL BY RT-PCR (FLU A&B, COVID) ARPGX2
Influenza A by PCR: NEGATIVE
Influenza B by PCR: NEGATIVE
SARS Coronavirus 2 by RT PCR: NEGATIVE

## 2021-08-09 MED ORDER — BENZONATATE 200 MG PO CAPS
200.0000 mg | ORAL_CAPSULE | Freq: Three times a day (TID) | ORAL | 0 refills | Status: AC
  Filled 2021-08-09: qty 30, 10d supply, fill #0

## 2021-08-09 NOTE — Progress Notes (Signed)
I connected with  Sheri [name] on 08/09/21 by telephone,  verified that I am speaking with the correct person using two identifiers (Name and Date of Birth). Patient was at her office and the provider was at the clinic. Patient agreed to conduct this telephone visit with the provider. I spent a total of 15 minutes both directly and indirectly working on this patient's care.   I discussed the limitations of evaluation and management of telephone visits. The patient expressed understanding and agreed to proceed.      Sheri [name], is a 45 y.o. female  visited this morning using the phone mode for her symptoms.  UKG:[id number]  [id number]  DOB - 30-Dec-1975  Subjective:  Chief Complaint and HPI: Sheri [name] is a 45 y.o. female who was visited this morning via telephone mode for her symptoms.  Patient is complaining about feeling congested, intermittent dry cough, and sometimes having difficulty breathing.  Patient was recently treated for bronchitis on August 04, 2021, she was given doxycycline 100 mg p.o. twice daily for a total of 20 capsules.  Patient believes that the medication is not helping, and would like further management.  Denies chest pain, fever, nausea or vomiting, dizziness, or any other symptom than above.  Patient who is a former smoker was recently tested for COPD which she does not have, according to the patient.  ED/Hospital notes reviewed.   Social History: Reviewed Family history: Reviewed  ROS:   Constitutional:  No f/c, No night sweats, No unexplained weight loss. EENT:  No vision changes, No blurry vision, No hearing changes. No mouth, throat, or ear problems.  Respiratory: Has intermittent cough and shortness of breath. Cardiac: No CP, no palpitations GI:  No abd pain, No N/V/D. GU: No Urinary s/sx Musculoskeletal: No joint pain Neuro: No headache, no dizziness, no motor weakness.  Skin: No rash Endocrine:  No polydipsia. No polyuria.  Psych:  Denies SI/HI  No problems updated.  ALLERGIES: Allergies  Allergen Reactions   Amoxicillin Hives and Rash   Iodine Anaphylaxis and Swelling   Penicillins Hives, Itching and Rash   Erythromycin Swelling and Other (See Comments)   Metformin Diarrhea    PAST MEDICAL HISTORY: Past Medical History:  Diagnosis Date   Asthma    Diabetes mellitus without complication (HCC)    GERD (gastroesophageal reflux disease)    H/O neck surgery    ED records from 07/26/2020   Hypertension    Skin cancer     MEDICATIONS AT HOME: Prior to Admission medications   Medication Sig Start Date End Date Taking? Authorizing Provider  albuterol (VENTOLIN HFA) 108 (90 Base) MCG/ACT inhaler Inhale 2 puffs into the lungs every 6 (six) hours as needed for wheezing or shortness of breath. 10/17/20   Camillia Herter, NP  albuterol (VENTOLIN HFA) 108 (90 Base) MCG/ACT inhaler Inhale 2 puffs into the lungs every 6 (six) hours as needed for wheezing or shortness of breath. 08/04/21   [name], FNP  Alcohol Swabs (ALCOHOL PREP) 70 % PADS One injection weekly. 10/18/20   Camillia Herter, NP  ALPRAZolam Duanne Moron) 0.5 MG tablet May take 1 tablet 30 min prior to procedure and additional 1-2 tablet at time of procedure. Must have driver. 07/11/21   Sater, Nanine Means, MD  amLODipine (NORVASC) 10 MG tablet Take 1 tablet (10 mg total) by mouth daily. 07/23/21 10/21/21  Camillia Herter, NP  atorvastatin (LIPITOR) 20 MG tablet Take 1 tablet (20 mg total) by mouth daily.  07/23/21 11/20/21  Camillia Herter, NP  Blood Glucose Monitoring Suppl (TRUE METRIX METER) w/Device KIT Use as directed 10/18/20   Camillia Herter, NP  buPROPion [facility] SR) 150 MG 12 hr tablet Take 1 tablet (150 mg total) by mouth daily for 3 days, THEN 1 tablet (150 mg total) 2 (two) times daily for 27 days. 07/23/21 08/23/21  Camillia Herter, NP  Continuous Blood Gluc Sensor (DEXCOM G6 SENSOR) MISC Use to check blood sugar 4 (four) times daily -  before meals  and at bedtime. 07/23/21 11/20/21  Camillia Herter, NP  Continuous Blood Gluc Sensor (FREESTYLE LIBRE 2 SENSOR) MISC Use as directed to check blood sugar 06/06/21   Camillia Herter, NP  Continuous Blood Gluc Transmit (DEXCOM G6 TRANSMITTER) MISC Use to check blood sugar  4 (four) times daily -  before meals and at bedtime. 07/23/21   Camillia Herter, NP  COVID-19 mRNA bivalent vaccine, Moderna, (MODERNA COVID-19 BIVAL BOOSTER) 50 MCG/0.5ML injection Inject into the muscle. 06/07/21   Carlyle Basques, MD  doxycycline (VIBRA-TABS) 100 MG tablet Take 1 tablet (100 mg total) by mouth 2 (two) times daily for 10 days. Take with food 08/04/21 08/14/21  [name], FNP  Dulaglutide 0.75 MG/0.5ML SOPN Inject 0.75 mg into the skin once a week. 07/25/21 10/23/21  Camillia Herter, NP  DULoxetine (CYMBALTA) 60 MG capsule Take 1 capsule (60 mg total) by mouth daily. 07/01/21 06/26/22  Sater, Nanine Means, MD  famotidine (PEPCID) 20 MG tablet One after supper 11/08/20   Tanda Rockers, MD  glucose blood (TRUE METRIX BLOOD GLUCOSE TEST) test strip Use as instructed 10/18/20   Camillia Herter, NP  hydrochlorothiazide (HYDRODIURIL) 12.5 MG tablet Take 1 tablet (12.5 mg total) by mouth daily. 07/23/21 08/23/21  Camillia Herter, NP  influenza vac split quadrivalent PF (FLUARIX) 0.5 ML injection Inject into the muscle. 06/07/21   Carlyle Basques, MD  Insulin Pen Needle (BD PEN NEEDLE MICRO U/F) 32G X 6 MM MISC One injection weekly. 07/23/21   Camillia Herter, NP  lamoTRIgine (LAMICTAL) 25 MG tablet Take 1 tablet by mouth daily for 1 week, 1 tab twice daily for 1 week, 1 tablet three times daily for 1 week, then 2 tabs twice daily 07/01/21   Sater, Nanine Means, MD  olmesartan (BENICAR) 40 MG tablet Take 1 tablet (40 mg total) by mouth daily. 07/23/21 10/21/21  Camillia Herter, NP  pantoprazole (PROTONIX) 40 MG tablet Take 1 tablet (40 mg total) by mouth daily. 07/23/21 10/21/21  Camillia Herter, NP  predniSONE (DELTASONE) 20 MG  tablet Take 1 tablet (20 mg total) by mouth 2 (two) times daily with a meal for 5 days. 08/04/21 08/09/21  [name], FNP  traZODone (DESYREL) 100 MG tablet Take 1 tablet (100 mg total) by mouth at bedtime. 02/27/21 04/28/21  Camillia Herter, NP  TRUEplus Lancets 28G MISC Use as directed 10/18/20   Camillia Herter, NP     Objective:  EXAM:   Physical exam was deferred due to the nature of the visit.   Data Review Lab Results  Component Value Date   HGBA1C 6.6 (A) 07/23/2021   HGBA1C 6.7 (A) 05/10/2021   HGBA1C 7.0 (A) 03/04/2021     Assessment & Plan   1. Upper airway cough syndrome -Instructed to go to urgent care or emergency room immediately.  Verbalized understanding  2. Nose congested -As above  3. Dyspnea, unspecified type -As above  4. Bronchitis -Continue prescribed medication as indicated and go to urgent care or emergency room for further care.     Patient have been counseled extensively about nutrition and exercise    The patient was given clear instructions to go to ER or urgent care as she may benefit from chest x-rays. The patient verbalized understanding.    Feliberto Gottron, APRN, FNP-C [facility] [place], [place]   08/09/2021, 12:32 PM

## 2021-08-09 NOTE — Patient Instructions (Signed)
Instructed to go to urgent care or emergency room for further management And follow-up with PCP within 7 days or sooner if no relief in symptoms. Verbalized understanding.

## 2021-08-09 NOTE — ED Triage Notes (Signed)
Dx with bronchitis on 12/25 via virtual visit; sxs are not improving; was referred to ED for XR

## 2021-08-09 NOTE — ED Provider Notes (Signed)
[place] EMERGENCY DEPARTMENT Provider Note   CSN: [id number] Arrival date & time: 08/09/21  1057     History Chief Complaint  Patient presents with   Shortness of Breath    Sheri [name] is a 45 y.o. female.  45 year old female with history of asthma, diabetes, hypertension and hyperlipidemia presents with complaint of ongoing cough with congestion and shortness of breath.  Patient reports onset of symptoms 10 days ago, had vomiting and diarrhea at that time which have since resolved.  Patient had a virtual visit with her PCP 5 days ago at which time she was prescribed doxycycline, prednisone, albuterol inhaler for likely bronchitis.  Patient called PCP office today with ongoing shortness of breath and was deferred to the ER urgent care for chest x-ray.  Denies fevers, chills.  Feels like her symptoms are not improving.  Reports negative home COVID test onset.      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     Family History  Problem Relation Age of Onset   Hypertension Mother    Diabetes Mother    Diabetes Father    Hypertension Father    Colon cancer Maternal  Grandfather    Diabetes Paternal Grandmother    Rectal cancer Neg Hx    Stomach cancer Neg Hx    Esophageal cancer Neg Hx     Social History   Tobacco Use   Smoking status: Some Days    Packs/day: 0.50    Years: 22.00    Pack years: 11.00    Types: Cigarettes   Smokeless tobacco: Never   Tobacco comments:    4-5 cigs per day 11/08/20//lmr  Vaping Use   Vaping Use: Some days  Substance Use Topics   Alcohol use: Yes    Comment: occasional   Drug use: Not Currently    Home Medications Prior to Admission medications   Medication Sig Start Date End Date Taking? Authorizing Provider  benzonatate (TESSALON) 200 MG capsule Take 1 capsule (200 mg total) by mouth every 8 (eight) hours for 10 days. 08/09/21 08/19/21 Yes Tacy Learn, PA-C  albuterol (VENTOLIN HFA) 108 (90 Base) MCG/ACT inhaler Inhale 2 puffs into the lungs every 6 (six) hours as needed for wheezing or shortness of breath. 10/17/20   Camillia Herter, NP  albuterol (VENTOLIN HFA) 108 (90 Base) MCG/ACT inhaler Inhale 2 puffs into the lungs every 6 (six) hours as needed for wheezing or shortness of breath. 08/04/21   [name], FNP  Alcohol Swabs (ALCOHOL PREP) 70 % PADS One injection weekly. 10/18/20   Minette Brine, Amy  J, NP  ALPRAZolam Duanne Moron) 0.5 MG tablet May take 1 tablet 30 min prior to procedure and additional 1-2 tablet at time of procedure. Must have driver. 07/11/21   Sater, Nanine Means, MD  amLODipine (NORVASC) 10 MG tablet Take 1 tablet (10 mg total) by mouth daily. 07/23/21 10/21/21  Camillia Herter, NP  atorvastatin (LIPITOR) 20 MG tablet Take 1 tablet (20 mg total) by mouth daily. 07/23/21 11/20/21  Camillia Herter, NP  Blood Glucose Monitoring Suppl (TRUE METRIX METER) w/Device KIT Use as directed 10/18/20   Camillia Herter, NP  buPROPion [facility] SR) 150 MG 12 hr tablet Take 1 tablet (150 mg total) by mouth daily for 3 days, THEN 1 tablet (150 mg total) 2 (two) times daily for 27 days. 07/23/21 08/23/21  Camillia Herter, NP  Continuous Blood Gluc Sensor (DEXCOM G6 SENSOR) MISC Use to check blood sugar 4 (four) times daily -  before meals and at bedtime. 07/23/21 11/20/21  Camillia Herter, NP  Continuous Blood Gluc Sensor (FREESTYLE LIBRE 2 SENSOR) MISC Use as directed to check blood sugar 06/06/21   Camillia Herter, NP  Continuous Blood Gluc Transmit (DEXCOM G6 TRANSMITTER) MISC Use to check blood sugar  4 (four) times daily -  before meals and at bedtime. 07/23/21   Camillia Herter, NP  COVID-19 mRNA bivalent vaccine, Moderna, (MODERNA COVID-19 BIVAL BOOSTER) 50 MCG/0.5ML injection Inject into the muscle. 06/07/21   Carlyle Basques, MD  doxycycline (VIBRA-TABS) 100 MG tablet Take 1 tablet (100 mg total) by mouth 2 (two) times daily for 10 days. Take with food 08/04/21 08/14/21  [name], FNP  Dulaglutide 0.75 MG/0.5ML SOPN Inject 0.75 mg into the skin once a week. 07/25/21 10/23/21  Camillia Herter, NP  DULoxetine (CYMBALTA) 60 MG capsule Take 1 capsule (60 mg total) by mouth daily. 07/01/21 06/26/22  Sater, Nanine Means, MD  famotidine (PEPCID) 20 MG tablet One after supper 11/08/20   Tanda Rockers, MD  glucose blood (TRUE METRIX BLOOD GLUCOSE TEST) test strip Use as instructed 10/18/20   Camillia Herter, NP  hydrochlorothiazide (HYDRODIURIL) 12.5 MG tablet Take 1 tablet (12.5 mg total) by mouth daily. 07/23/21 08/23/21  Camillia Herter, NP  influenza vac split quadrivalent PF (FLUARIX) 0.5 ML injection Inject into the muscle. 06/07/21   Carlyle Basques, MD  Insulin Pen Needle (BD PEN NEEDLE MICRO U/F) 32G X 6 MM MISC One injection weekly. 07/23/21   Camillia Herter, NP  lamoTRIgine (LAMICTAL) 25 MG tablet Take 1 tablet by mouth daily for 1 week, 1 tab twice daily for 1 week, 1 tablet three times daily for 1 week, then 2 tabs twice daily 07/01/21   Sater, Nanine Means, MD  olmesartan (BENICAR) 40 MG tablet Take 1 tablet (40 mg total) by mouth daily. 07/23/21 10/21/21  Camillia Herter, NP  pantoprazole (PROTONIX) 40  MG tablet Take 1 tablet (40 mg total) by mouth daily. 07/23/21 10/21/21  Camillia Herter, NP  predniSONE (DELTASONE) 20 MG tablet Take 1 tablet (20 mg total) by mouth 2 (two) times daily with a meal for 5 days. 08/04/21 08/09/21  [name], FNP  traZODone (DESYREL) 100 MG tablet Take 1 tablet (100 mg total) by mouth at bedtime. 02/27/21 04/28/21  Camillia Herter, NP  TRUEplus Lancets 28G MISC Use as directed 10/18/20   Camillia Herter, NP    Allergies    Amoxicillin, Iodine, Penicillins, Erythromycin, and Metformin  Review of Systems  Review of Systems  Constitutional:  Negative for chills and fever.  HENT:  Positive for congestion.   Respiratory:  Positive for cough and shortness of breath.   Cardiovascular:  Negative for chest pain.  Gastrointestinal:  Negative for nausea and vomiting.  Musculoskeletal:  Negative for arthralgias and myalgias.  Skin:  Negative for rash and wound.  Allergic/Immunologic: Positive for immunocompromised state.  Hematological:  Negative for adenopathy.  Psychiatric/Behavioral:  Negative for confusion.   All other systems reviewed and are negative.  Physical Exam Updated Vital Signs BP (!) 164/80    Pulse 74    Temp 97.8 F (36.6 C) (Oral)    Resp 17    Ht _0  (1.753 m)    Wt 118.8 kg    SpO2 99%    BMI 38.69 kg/m   Physical Exam Vitals and nursing note reviewed.  Constitutional:      General: She is not in acute distress.    Appearance: She is well-developed. She is not diaphoretic.  HENT:     Head: Normocephalic and atraumatic.     Right Ear: Tympanic membrane and ear canal normal.     Left Ear: Tympanic membrane and ear canal normal.     Nose: Congestion present.     Mouth/Throat:     Mouth: Mucous membranes are moist.     Pharynx: No oropharyngeal exudate or posterior oropharyngeal erythema.  Eyes:     Conjunctiva/sclera: Conjunctivae normal.  Cardiovascular:     Rate and Rhythm: Normal rate and regular rhythm.     Heart sounds: Normal  heart sounds.  Pulmonary:     Breath sounds: Normal breath sounds. No decreased breath sounds or wheezing.  Musculoskeletal:     Cervical back: Neck supple.     Right lower leg: No tenderness. No edema.     Left lower leg: No tenderness. No edema.  Lymphadenopathy:     Cervical: No cervical adenopathy.  Skin:    General: Skin is warm and dry.     Findings: No erythema or rash.  Neurological:     Mental Status: She is alert and oriented to person, place, and time.  Psychiatric:        Behavior: Behavior normal.    ED Results / Procedures / Treatments   Labs (all labs ordered are listed, but only abnormal results are displayed) Labs Reviewed  RESP PANEL BY RT-PCR (FLU A&B, COVID) [id number]    EKG None  Radiology DG Chest 2 View  Result Date: 08/09/2021 CLINICAL DATA:  Cough EXAM: CHEST - 2 VIEW COMPARISON:  Chest radiograph 11/08/2020 FINDINGS: The cardiomediastinal silhouette is normal. There is no focal consolidation or pulmonary edema. There is no pleural effusion or pneumothorax. There is no acute osseous abnormality. IMPRESSION: No radiographic evidence of acute cardiopulmonary process. Electronically Signed   By: Valetta Mole M.D.   On: 08/09/2021 11:46    Procedures Procedures   Medications Ordered in ED Medications - No data to display  ED Course  I have reviewed the triage vital signs and the nursing notes.  Pertinent labs & imaging results that were available during my care of the patient were reviewed by me and considered in my medical decision making (see chart for details).    MDM Rules/Calculators/A&P                         This patient presents to the ED for concern of cough, congestion, shortness of breath,  this involves an extensive number of treatment options, and is a complaint that carries with it a high risk of complications and morbidity.  The differential diagnosis includes viral URI, bronchitis, pneumonia, pneumothorax, pleural effusion.   Co  morbidities that complicate the patient evaluation  Diabetes, hypertension, hyperlipidemia, asthma   Additional history obtained:  Additional history obtained from triage note External records from outside source obtained and reviewed including recent visit to PCP office x2 this week, 08/04/2021, 08/09/2021   Lab Tests:  I Ordered, and personally interpreted labs.  The pertinent results include: COVID/flu test  Imaging Studies ordered:  I ordered imaging studies including chest x-ray I independently visualized and interpreted imaging which showed no acute pathology I agree with the radiologist interpretation   Cardiac Monitoring:  Not indicated   Medicines ordered and prescription drug management:  I have reviewed the patients home medicines and have made adjustments as needed Recommend discontinue Mucinex.  Will prescribe Tessalon for cough.  Recommend saline sinus rinse twice daily, Flonase and Zyrtec.  Continue with inhaler.  10 complete doxycycline as previous prescribed.   Test Considered:     Critical Interventions:  None   Consultations Obtained:  Not indicated   Problem List / ED Course:  Patient sent by PCP office for chest x-ray due to ongoing URI symptoms.  Chest x-ray is unremarkable.  Patient is negative for COVID and flu.  Lungs are clear to auscultation.  Vitals reviewed, blood pressure is mildly elevated.  Room air O2 sat 99%.  Recommend that she discontinue the Mucinex, can switch to Coricidin HBP. Patient requests additional prednisone however due to history of diabetes, discouraged additional use.  Patient does acknowledge her blood sugar has been running higher than usual, encouraged patient to drink plenty of water.      Dispostion:  After consideration of the diagnostic results and the patients response to treatment, I feel that the patent would benefit from discontinuing OTC cold medications.  Recommend Coricidin HBP, Tessalon.,   Saline sinus rinse, Zyrtec and Flonase.  Can continue with albuterol inhaler.  Complete doxycycline as prescribed by PCP office although appears to have viral illness..      Final Clinical Impression(s) / ED Diagnoses Final diagnoses:  Viral URI with cough    Rx / DC Orders ED Discharge Orders          Ordered    benzonatate (TESSALON) 200 MG capsule  Every 8 hours        08/09/21 1451             Roque Lias 08/09/21 1516    Hayden Rasmussen, MD 08/09/21 [phone]

## 2021-08-09 NOTE — ED Notes (Signed)
Pt requesting more prednisone, PA informed and will speak to patient

## 2021-08-09 NOTE — Discharge Instructions (Addendum)
Discontinue Mucinex.  Take Tessalon as needed as prescribed for cough. Take Coricidin HBP as directed. Continue inhaler as prescribed. Finish the prednisone as prescribed.  Finish doxycycline as previously prescribed. Recommend Zyrtec and Flonase.  Daily sinus rinse. Recheck with your doctor.

## 2021-08-12 ENCOUNTER — Other Ambulatory Visit (HOSPITAL_BASED_OUTPATIENT_CLINIC_OR_DEPARTMENT_OTHER): Payer: Self-pay

## 2021-08-13 ENCOUNTER — Ambulatory Visit: Payer: BC Managed Care – PPO | Admitting: Family

## 2021-08-13 ENCOUNTER — Other Ambulatory Visit (HOSPITAL_BASED_OUTPATIENT_CLINIC_OR_DEPARTMENT_OTHER): Payer: Self-pay

## 2021-08-13 NOTE — Progress Notes (Signed)
Virtual Visit via Telephone Note  I connected with Sheri [name], on 08/19/2021 at 3:27 PM by telephone due to the COVID-19 pandemic and verified that I am speaking with the correct person using two identifiers.  Due to current restrictions/limitations of in-office visits due to the COVID-19 pandemic, this scheduled clinical appointment was converted to a telehealth visit.   Consent: I discussed the limitations, risks, security and privacy concerns of performing an evaluation and management service by telephone and the availability of in person appointments. I also discussed with the patient that there may be a patient responsible charge related to this service. The patient expressed understanding and agreed to proceed.   Location of Patient: Home  Location of Provider: [facility] [place] participating in Telemedicine visit: Xaviera Flaten Hodes Durga Saldarriaga Minette Brine, NP [place] Else, [place]   History of Present Illness:  Sheri [name]. [name] is a 46 year-old female who presents for smoking cessation follow-up.  SMOKING CESSATION FOLLOW-UP: 07/23/2021: - Begin Bupropion as prescribed. Counseled on medication compliance and adverse effects.  08/19/2021: Doing well on current regimen. Smoking about 5 cigarettes daily. Reports doesn't smoke the entire cigarette just smokes enough for taste.   2. HYPERTENSION FOLLOW-UP: 07/23/2021: - Continue Olmesartan and Amlodipine as prescribed.  - Begin Hydrochlorothiazide as prescribed.   08/19/2021: Doing well on current regimen. No side effects. No issues/concerns. Denies chest pain and shortness of breath. Home blood pressures 120's-130's/60's.   3. CONGESTION: Still having congestion with nonproductive cough. Doxycycline from recent telemedicine appointment seemed to help some. Has tried several prescribed/over-the-counter medications and home remedies without relief.    Past Medical History:  Diagnosis Date    Asthma    Diabetes mellitus without complication ([facility])    GERD (gastroesophageal reflux disease)    H/O neck surgery    ED records from 07/26/2020   Hypertension    Skin cancer    Allergies  Allergen Reactions   Amoxicillin Hives and Rash   Iodine Anaphylaxis and Swelling   Penicillins Hives, Itching and Rash   Erythromycin Swelling and Other (See Comments)   Metformin Diarrhea    Current Outpatient Medications on File Prior to Visit  Medication Sig Dispense Refill   albuterol (VENTOLIN HFA) 108 (90 Base) MCG/ACT inhaler Inhale 2 puffs into the lungs every 6 (six) hours as needed for wheezing or shortness of breath. 8 g 0   albuterol (VENTOLIN HFA) 108 (90 Base) MCG/ACT inhaler Inhale 2 puffs into the lungs every 6 (six) hours as needed for wheezing or shortness of breath. 8 g 0   Alcohol Swabs (ALCOHOL PREP) 70 % PADS One injection weekly. 100 each 1   ALPRAZolam (XANAX) 0.5 MG tablet May take 1 tablet 30 min prior to procedure and additional 1-2 tablet at time of procedure. Must have driver. 3 tablet 0   amLODipine (NORVASC) 10 MG tablet Take 1 tablet (10 mg total) by mouth daily. 90 tablet 0   atorvastatin (LIPITOR) 20 MG tablet Take 1 tablet (20 mg total) by mouth daily. 120 tablet 0   benzonatate (TESSALON) 200 MG capsule Take 1 capsule (200 mg total) by mouth every 8 (eight) hours for 10 days. 30 capsule 0   Blood Glucose Monitoring Suppl (TRUE METRIX METER) w/Device KIT Use as directed 1 kit 0   buPROPion (WELLBUTRIN SR) 150 MG 12 hr tablet Take 1 tablet (150 mg total) by mouth daily for 3 days, THEN 1 tablet (150 mg total) 2 (two)  times daily for 27 days. 57 tablet 0   Continuous Blood Gluc Sensor (DEXCOM G6 SENSOR) MISC Use to check blood sugar 4 (four) times daily -  before meals and at bedtime. 3 each 8   Continuous Blood Gluc Sensor (FREESTYLE LIBRE 2 SENSOR) MISC Use as directed to check blood sugar 2 each 0   Continuous Blood Gluc Transmit (DEXCOM G6 TRANSMITTER) MISC Use  to check blood sugar  4 (four) times daily -  before meals and at bedtime. 1 each 3   COVID-19 mRNA bivalent vaccine, Moderna, (MODERNA COVID-19 BIVAL BOOSTER) 50 MCG/0.5ML injection Inject into the muscle. 0.5 mL 0   Dulaglutide 0.75 MG/0.5ML SOPN Inject 0.75 mg into the skin once a week. 6 mL 0   DULoxetine (CYMBALTA) 60 MG capsule Take 1 capsule (60 mg total) by mouth daily. 90 capsule 3   famotidine (PEPCID) 20 MG tablet One after supper 30 tablet 11   glucose blood (TRUE METRIX BLOOD GLUCOSE TEST) test strip Use as instructed 100 each 12   hydrochlorothiazide (HYDRODIURIL) 12.5 MG tablet Take 1 tablet (12.5 mg total) by mouth daily. 30 tablet 0   influenza vac split quadrivalent PF (FLUARIX) 0.5 ML injection Inject into the muscle. 0.5 mL 0   Insulin Pen Needle (BD PEN NEEDLE MICRO U/F) 32G X 6 MM MISC One injection weekly. 100 each 0   lamoTRIgine (LAMICTAL) 25 MG tablet Take 1 tablet by mouth daily for 1 week, 1 tab twice daily for 1 week, 1 tablet three times daily for 1 week, then 2 tabs twice daily 120 tablet 5   olmesartan (BENICAR) 40 MG tablet Take 1 tablet (40 mg total) by mouth daily. 90 tablet 0   pantoprazole (PROTONIX) 40 MG tablet Take 1 tablet (40 mg total) by mouth daily. 90 tablet 0   traZODone (DESYREL) 100 MG tablet Take 1 tablet (100 mg total) by mouth at bedtime. 30 tablet 1   TRUEplus Lancets 28G MISC Use as directed 100 each 4   No current facility-administered medications on file prior to visit.    Observations/Objective: Alert and oriented x 3. Not in acute distress. Physical examination not completed as this is a telemedicine visit.  Assessment and Plan: 1. Encounter for smoking cessation counseling: - Continue Bupropion as prescribed.  - Begin Nicotine patch as prescribed.  - Follow-up with primary provider in 4 weeks or sooner if needed.  - buPROPion (WELLBUTRIN SR) 150 MG 12 hr tablet; Take 1 tablet (150 mg total) by mouth 2 (two) times daily.  Dispense:  180 tablet; Refill: 0 - nicotine (NICODERM CQ - DOSED IN MG/24 HOURS) 14 mg/24hr patch; Place 1 patch (14 mg total) onto the skin daily.  Dispense: 28 patch; Refill: 2  2. Essential hypertension: - Continue Olmesartan, Amlodipine, and Hydrochlorothiazide as prescribed. - Counseled on blood pressure goal of less than 130/80, low-sodium, DASH diet, medication compliance, 150 minutes of moderate intensity exercise per week as tolerated. Discussed medication compliance, adverse effects. - Follow-up with primary provider in 3 months or sooner if needed.  - hydrochlorothiazide (HYDRODIURIL) 12.5 MG tablet; Take 1 tablet (12.5 mg total) by mouth daily.  Dispense: 90 tablet; Refill: 0  3. Upper airway cough syndrome: - Doxycycline as prescribed.  - Follow-up with primary provider as scheduled. - doxycycline (VIBRA-TABS) 100 MG tablet; Take 1 tablet (100 mg total) by mouth 2 (two) times daily for 10 days.  Dispense: 20 tablet; Refill: 0   Follow Up Instructions: Follow-up with primary provider  in 3 months or sooner if needed for chronic disease management.   Patient was given clear instructions to go to Emergency Department or return to medical center if symptoms don't improve, worsen, or new problems develop.The patient verbalized understanding.  I discussed the assessment and treatment plan with the patient. The patient was provided an opportunity to ask questions and all were answered. The patient agreed with the plan and demonstrated an understanding of the instructions.   The patient was advised to call back or seek an in-person evaluation if the symptoms worsen or if the condition fails to improve as anticipated.     I provided 10 minutes total of non-face-to-face time during this encounter.   Camillia Herter, NP  [facility] [place], [place] 08/19/2021, 3:27 PM

## 2021-08-14 ENCOUNTER — Other Ambulatory Visit (HOSPITAL_BASED_OUTPATIENT_CLINIC_OR_DEPARTMENT_OTHER): Payer: Self-pay

## 2021-08-15 ENCOUNTER — Other Ambulatory Visit (HOSPITAL_BASED_OUTPATIENT_CLINIC_OR_DEPARTMENT_OTHER): Payer: Self-pay

## 2021-08-19 ENCOUNTER — Other Ambulatory Visit: Payer: Self-pay

## 2021-08-19 ENCOUNTER — Other Ambulatory Visit (HOSPITAL_BASED_OUTPATIENT_CLINIC_OR_DEPARTMENT_OTHER): Payer: Self-pay

## 2021-08-19 ENCOUNTER — Ambulatory Visit (INDEPENDENT_AMBULATORY_CARE_PROVIDER_SITE_OTHER): Payer: BC Managed Care – PPO | Admitting: Family

## 2021-08-19 DIAGNOSIS — R058 Other specified cough: Secondary | ICD-10-CM | POA: Diagnosis not present

## 2021-08-19 DIAGNOSIS — Z716 Tobacco abuse counseling: Secondary | ICD-10-CM

## 2021-08-19 DIAGNOSIS — I10 Essential (primary) hypertension: Secondary | ICD-10-CM

## 2021-08-19 MED ORDER — HYDROCHLOROTHIAZIDE 12.5 MG PO TABS: 12.5000 mg | ORAL_TABLET | Freq: Every day | ORAL | 0 refills | Status: DC

## 2021-08-19 MED ORDER — DOXYCYCLINE HYCLATE 100 MG PO TABS: 100.0000 mg | ORAL_TABLET | Freq: Two times a day (BID) | ORAL | 0 refills | Status: AC

## 2021-08-19 MED ORDER — BUPROPION HCL ER (SR) 150 MG PO TB12: 150.0000 mg | ORAL_TABLET | Freq: Two times a day (BID) | ORAL | 0 refills | Status: DC

## 2021-08-19 MED ORDER — NICOTINE 14 MG/24HR TD PT24: 14.0000 mg | MEDICATED_PATCH | Freq: Every day | TRANSDERMAL | 2 refills | Status: DC

## 2021-08-19 NOTE — Progress Notes (Signed)
Pt presents for telemedicine visit for follow-up on Wellbutrin, pt would also like a decongestant because the cough and phlegm still has not cleared up

## 2021-08-23 ENCOUNTER — Other Ambulatory Visit (HOSPITAL_BASED_OUTPATIENT_CLINIC_OR_DEPARTMENT_OTHER): Payer: Self-pay

## 2021-09-04 DIAGNOSIS — M25774 Osteophyte, right foot: Secondary | ICD-10-CM | POA: Diagnosis not present

## 2021-09-04 DIAGNOSIS — B351 Tinea unguium: Secondary | ICD-10-CM | POA: Diagnosis not present

## 2021-09-04 DIAGNOSIS — M2141 Flat foot [pes planus] (acquired), right foot: Secondary | ICD-10-CM | POA: Diagnosis not present

## 2021-09-05 NOTE — Progress Notes (Addendum)
Patient ID: Sheri [name], female    DOB: 1975/08/16  MRN: [id number]  CC: Smoking Cessation Follow-Up  Subjective: Sheri [name] is a 46 y.o. female who presents for smoking cessation follow-up.   Her concerns today include: SMOKING CESSATION FOLLOW-UP: 08/19/2021: - Continue Bupropion as prescribed.  - Begin Nicotine patch as prescribed.   09/09/2021: Doing well on current regimen, requesting refills. No issues/concerns. Smoking about 2 to 3 cigarettes daily.  2. HYPERTENSION FOLLOW-UP: Scheduled for dental deep cleaning. Needs blood pressure cleared by primary care. Home blood pressures about the same as today's.  3. DIABETES FOLLOW-UP: Reports not taking Trulicity. Has improved diet. Needs refills of Dexcom supplies.    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 8 g 0   albuterol (VENTOLIN HFA) 108 (90 Base) MCG/ACT inhaler Inhale 2 puffs into the lungs every 6 (six) hours as needed for wheezing or shortness of breath. 8 g 0   Alcohol Swabs (ALCOHOL PREP) 70 % PADS One injection weekly. 100 each 1   ALPRAZolam (XANAX) 0.5 MG tablet May take 1 tablet 30 min prior to procedure and additional 1-2 tablet at time of procedure. Must have driver. 3 tablet 0   amLODipine (NORVASC) 10 MG tablet Take 1 tablet (10 mg total) by mouth daily. 90 tablet 0    atorvastatin (LIPITOR) 20 MG tablet Take 1 tablet (20 mg total) by mouth daily. 120 tablet 0   Blood Glucose Monitoring Suppl (TRUE METRIX METER) w/Device KIT Use as directed 1 kit 0   Continuous Blood Gluc Sensor (FREESTYLE LIBRE 2 SENSOR) MISC Use as directed to check blood sugar 2 each 0   COVID-19 mRNA bivalent vaccine, Moderna, (MODERNA COVID-19 BIVAL BOOSTER) 50 MCG/0.5ML injection Inject into the muscle. 0.5 mL 0   Dulaglutide 0.75 MG/0.5ML SOPN Inject 0.75 mg into the skin once a week. 6 mL 0   DULoxetine (CYMBALTA) 60 MG capsule Take 1 capsule (60 mg total) by mouth daily. 90 capsule 3   famotidine (PEPCID) 20 MG tablet One after supper 30 tablet 11   glucose blood (TRUE METRIX BLOOD GLUCOSE TEST) test strip Use as instructed 100 each 12   hydrochlorothiazide (HYDRODIURIL) 12.5 MG tablet Take 1 tablet (12.5 mg total) by mouth daily. 90 tablet 0   influenza vac split quadrivalent PF (FLUARIX) 0.5 ML injection Inject into the muscle. 0.5 mL 0   Insulin Pen Needle (BD PEN NEEDLE MICRO U/F) 32G X 6 MM MISC One injection weekly. 100 each 0   lamoTRIgine (LAMICTAL) 25 MG tablet Take 1 tablet by mouth daily for 1 week, 1 tab twice daily for 1 week, 1 tablet three times daily for 1 week, then 2 tabs twice daily 120 tablet 5   olmesartan (BENICAR) 40 MG tablet Take 1 tablet (40 mg total) by mouth daily. 90 tablet 0   pantoprazole (PROTONIX) 40 MG  tablet Take 1 tablet (40 mg total) by mouth daily. 90 tablet 0   traZODone (DESYREL) 100 MG tablet Take 1 tablet (100 mg total) by mouth at bedtime. 30 tablet 1   TRUEplus Lancets 28G MISC Use as directed 100 each 4   No current facility-administered medications on file prior to visit.    Allergies  Allergen Reactions   Amoxicillin Hives and Rash   Iodine Anaphylaxis and Swelling   Penicillins Hives, Itching and Rash   Erythromycin Swelling and Other (See Comments)   Metformin Diarrhea    Social History   Socioeconomic History   Marital  status: Single    Spouse name: Not on file   Number of children: Not on file   Years of education: Not on file   Highest education level: High school graduate  Occupational History   Not on file  Tobacco Use   Smoking status: Some Days    Packs/day: 0.50    Years: 22.00    Pack years: 11.00    Types: Cigarettes   Smokeless tobacco: Never   Tobacco comments:    4-5 cigs per day 11/08/20//lmr  Vaping Use   Vaping Use: Some days  Substance and Sexual Activity   Alcohol use: Yes    Comment: occasional   Drug use: Not Currently   Sexual activity: Never    Birth control/protection: None  Other Topics Concern   Not on file  Social History Narrative   ** Merged History Encounter **    Lives alone   Right handed   Caffeine: coffee every morning and tea.    Social Determinants of Health   Financial Resource Strain: Not on file  Food Insecurity: Not on file  Transportation Needs: Not on file  Physical Activity: Not on file  Stress: Not on file  Social Connections: Not on file  Intimate Partner Violence: Not on file    Family History  Problem Relation Age of Onset   Hypertension Mother    Diabetes Mother    Diabetes Father    Hypertension Father    Colon cancer Maternal Grandfather    Diabetes Paternal Grandmother    Rectal cancer Neg Hx    Stomach cancer Neg Hx    Esophageal cancer Neg Hx     Past Surgical History:  Procedure Laterality Date   BASAL CELL CARCINOMA EXCISION     ed records 07/26/2020   CERVICAL DISC SURGERY      ROS: Review of Systems Negative except as stated above  PHYSICAL EXAM: BP 131/80 (BP Location: Left Arm, Patient Position: Sitting, Cuff Size: Large)    Pulse 95    Temp 98.3 F (36.8 C)    Resp 18    Ht 5' 7.99" (1.727 m)    Wt 260 lb (117.9 kg)    SpO2 96%    BMI 39.54 kg/m   Physical Exam HENT:     Head: Normocephalic and atraumatic.  Eyes:     Extraocular Movements: Extraocular movements intact.     Conjunctiva/sclera:  Conjunctivae normal.     Pupils: Pupils are equal, round, and reactive to light.  Cardiovascular:     Rate and Rhythm: Normal rate and regular rhythm.     Pulses: Normal pulses.     Heart sounds: Normal heart sounds.  Pulmonary:     Effort: Pulmonary effort is normal.     Breath sounds: Normal breath sounds.  Musculoskeletal:     Cervical back: Normal range of motion and  neck supple.  Neurological:     General: No focal deficit present.     Mental Status: She is alert and oriented to person, place, and time.  Psychiatric:        Mood and Affect: Mood normal.        Behavior: Behavior normal.   ASSESSMENT AND PLAN: 1. Encounter for smoking cessation counseling: - Patient has decreased smoking down to 2 - 3 cigarettes daily.  - Continue Bupropion and Nicotine patch as prescribed.  - Follow-up with primary provider as scheduled. - buPROPion (WELLBUTRIN SR) 150 MG 12 hr tablet; Take 1 tablet (150 mg total) by mouth 2 (two) times daily.  Dispense: 180 tablet; Refill: 0 - nicotine (NICODERM CQ - DOSED IN MG/24 HOURS) 14 mg/24hr patch; Place 1 patch (14 mg total) onto the skin daily.  Dispense: 28 patch; Refill: 2  2. Essential hypertension: 3. Preoperative clearance: - Blood pressure at goal today in office. Home blood pressures the same.  - Continue Olmesartan, Amlodipine, and Hydrochlorothiazide as prescribed. No refills needed as of present.  - Patient cleared from primary care standpoint, in regards to blood pressure, for dental cleaning. Patient plans to have dentist fax over letterhead and required documentation for primary provider to complete prior to procedure.  - Counseled on blood pressure goal of less than 130/80, low-sodium, DASH diet, medication compliance, 150 minutes of moderate intensity exercise per week as tolerated. Discussed medication compliance, adverse effects. - Follow-up with primary provider as scheduled.  4. Type 2 diabetes mellitus with diabetic  polyneuropathy, without long-term current use of insulin ([place]): - Patient reports no longer taking Trulicity and has improved diet.  - Discussed the importance of healthy eating habits, low-carbohydrate diet, low-sugar diet, regular aerobic exercise (at least 150 minutes a week as tolerated) and medication compliance to achieve or maintain control of diabetes. - Plan to recheck hemoglobin A1c  when next due. Patient should continue checking blood sugars at home and follow-up with primary provider as scheduled. - Continuous Blood Gluc Sensor (DEXCOM G6 SENSOR) MISC; 3 each by Does not apply route 4 (four) times daily -  before meals and at bedtime.  Dispense: 3 each; Refill: 8 - Continuous Blood Gluc Transmit (DEXCOM G6 TRANSMITTER) MISC; Use to check blood sugar  4 (four) times daily -  before meals and at bedtime.  Dispense: 1 each; Refill: 4    Patient was given the opportunity to ask questions.  Patient verbalized understanding of the plan and was able to repeat key elements of the plan. Patient was given clear instructions to go to Emergency Department or return to medical center if symptoms don't improve, worsen, or new problems develop.The patient verbalized understanding.    Requested Prescriptions   Signed Prescriptions Disp Refills   Continuous Blood Gluc Sensor (DEXCOM G6 SENSOR) MISC 3 each 8    Sig: Use to check blood sugar 4 (four) times daily -  before meals and at bedtime.   Continuous Blood Gluc Transmit (DEXCOM G6 TRANSMITTER) MISC 1 each 4    Sig: Use to check blood sugar  4 (four) times daily -  before meals and at bedtime.   buPROPion (WELLBUTRIN SR) 150 MG 12 hr tablet 180 tablet 0    Sig: Take 1 tablet (150 mg total) by mouth 2 (two) times daily.   nicotine (NICODERM CQ - DOSED IN MG/24 HOURS) 14 mg/24hr patch 28 patch 2    Sig: Place 1 patch (14 mg total) onto the skin daily.  Follow-up with primary provider as scheduled.  Camillia Herter, NP

## 2021-09-09 ENCOUNTER — Other Ambulatory Visit: Payer: Self-pay

## 2021-09-09 ENCOUNTER — Encounter: Payer: Self-pay | Admitting: Family

## 2021-09-09 ENCOUNTER — Ambulatory Visit: Payer: BC Managed Care – PPO | Admitting: Family

## 2021-09-09 ENCOUNTER — Other Ambulatory Visit (HOSPITAL_BASED_OUTPATIENT_CLINIC_OR_DEPARTMENT_OTHER): Payer: Self-pay

## 2021-09-09 VITALS — BP 131/80 | HR 95 | Temp 98.3°F | Resp 18 | Ht 67.99 in | Wt 260.0 lb

## 2021-09-09 DIAGNOSIS — F17210 Nicotine dependence, cigarettes, uncomplicated: Secondary | ICD-10-CM

## 2021-09-09 DIAGNOSIS — I10 Essential (primary) hypertension: Secondary | ICD-10-CM | POA: Diagnosis not present

## 2021-09-09 DIAGNOSIS — E1142 Type 2 diabetes mellitus with diabetic polyneuropathy: Secondary | ICD-10-CM | POA: Diagnosis not present

## 2021-09-09 DIAGNOSIS — Z01818 Encounter for other preprocedural examination: Secondary | ICD-10-CM

## 2021-09-09 DIAGNOSIS — Z716 Tobacco abuse counseling: Secondary | ICD-10-CM

## 2021-09-09 MED ORDER — BUPROPION HCL ER (SR) 150 MG PO TB12
150.0000 mg | ORAL_TABLET | Freq: Two times a day (BID) | ORAL | 0 refills | Status: AC
  Filled 2021-09-09: qty 180, 90d supply, fill #0

## 2021-09-09 MED ORDER — NICOTINE 14 MG/24HR TD PT24
14.0000 mg | MEDICATED_PATCH | Freq: Every day | TRANSDERMAL | 2 refills | Status: AC
  Filled 2021-09-09 – 2021-10-16 (×3): qty 28, 28d supply, fill #0
  Filled 2022-04-01: qty 28, 28d supply, fill #1

## 2021-09-09 MED ORDER — DEXCOM G6 SENSOR MISC
3.0000 | Freq: Three times a day (TID) | 8 refills | Status: AC
  Filled 2021-09-09: qty 3, 30d supply, fill #0

## 2021-09-09 MED ORDER — DEXCOM G6 TRANSMITTER MISC
1.0000 | Freq: Three times a day (TID) | 4 refills | Status: DC
Start: 2021-09-09 — End: 2022-07-21
  Filled 2021-09-09: qty 1, 90d supply, fill #0

## 2021-09-09 NOTE — Progress Notes (Signed)
Pt presents for tobacco cessation follow -up and BP assessment for deep cleaning procedure w/dentist

## 2021-09-11 DIAGNOSIS — E785 Hyperlipidemia, unspecified: Secondary | ICD-10-CM | POA: Diagnosis not present

## 2021-09-11 DIAGNOSIS — F17210 Nicotine dependence, cigarettes, uncomplicated: Secondary | ICD-10-CM | POA: Diagnosis not present

## 2021-09-11 DIAGNOSIS — Z6838 Body mass index (BMI) 38.0-38.9, adult: Secondary | ICD-10-CM | POA: Diagnosis not present

## 2021-09-11 DIAGNOSIS — I10 Essential (primary) hypertension: Secondary | ICD-10-CM | POA: Diagnosis not present

## 2021-09-11 DIAGNOSIS — E119 Type 2 diabetes mellitus without complications: Secondary | ICD-10-CM | POA: Diagnosis not present

## 2021-09-20 ENCOUNTER — Ambulatory Visit (INDEPENDENT_AMBULATORY_CARE_PROVIDER_SITE_OTHER): Payer: BC Managed Care – PPO | Admitting: Sports Medicine

## 2021-09-20 ENCOUNTER — Ambulatory Visit (INDEPENDENT_AMBULATORY_CARE_PROVIDER_SITE_OTHER): Payer: BC Managed Care – PPO

## 2021-09-20 ENCOUNTER — Encounter: Payer: Self-pay | Admitting: Sports Medicine

## 2021-09-20 DIAGNOSIS — M7750 Other enthesopathy of unspecified foot: Secondary | ICD-10-CM

## 2021-09-20 DIAGNOSIS — M7751 Other enthesopathy of right foot: Secondary | ICD-10-CM | POA: Diagnosis not present

## 2021-09-20 DIAGNOSIS — M79674 Pain in right toe(s): Secondary | ICD-10-CM

## 2021-09-20 DIAGNOSIS — L608 Other nail disorders: Secondary | ICD-10-CM | POA: Diagnosis not present

## 2021-09-20 DIAGNOSIS — E119 Type 2 diabetes mellitus without complications: Secondary | ICD-10-CM | POA: Diagnosis not present

## 2021-09-20 NOTE — Progress Notes (Signed)
Subjective: Sheri [name] is a 46 y.o. female patient presents to office today complaining of a moderately painful incurvated, thick right great toenail states that it is curling and states that she went to another podiatrist who could not do her procedure until next month states that she was told likely she has a bone spur underneath her toenail that is causing her nail to grow up patient reports that her toenail has slowly gotten worse this way over time patient denies known trauma or injury to the toe and states it has been getting increasingly painful for the last 6 months very difficult to wear shoe because of the pressure and pain reports that her last A1c was 6.7 and blood sugar she does not check and states that she is still considered borderline diabetic.  Patient denies any other pedal complaints at this time.  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 8 g 0   Alcohol Swabs (ALCOHOL PREP) 70 % PADS One injection weekly. 100 each 1   ALPRAZolam (XANAX) 0.5 MG tablet May take 1 tablet 30 min prior to procedure and additional 1-2 tablet at time of procedure. Must have driver. 3 tablet 0   amLODipine (NORVASC) 10 MG tablet Take 1 tablet (10 mg total) by mouth daily. 90 tablet 0   atorvastatin (LIPITOR) 20  MG tablet Take 1 tablet (20 mg total) by mouth daily. 120 tablet 0   Blood Glucose Monitoring Suppl (TRUE METRIX METER) w/Device KIT Use as directed 1 kit 0   buPROPion (WELLBUTRIN SR) 150 MG 12 hr tablet Take 1 tablet (150 mg total) by mouth 2 (two) times daily. 180 tablet 0   Continuous Blood Gluc Sensor (DEXCOM G6 SENSOR) MISC Use to check blood sugar 4 (four) times daily -  before meals and at bedtime. 3 each 8   Continuous Blood Gluc Sensor (FREESTYLE LIBRE 2 SENSOR) MISC Use as directed to check blood sugar 2 each 0   Continuous Blood Gluc Transmit (DEXCOM G6 TRANSMITTER) MISC Use to check blood sugar  4 (four) times daily -  before meals and at bedtime. 1 each 4   COVID-19 mRNA bivalent vaccine, Moderna, (MODERNA COVID-19 BIVAL BOOSTER) 50 MCG/0.5ML injection Inject into the muscle. 0.5 mL 0   Dulaglutide 0.75 MG/0.5ML SOPN Inject 0.75 mg into the skin once a week. 6 mL 0   DULoxetine (CYMBALTA) 60 MG capsule Take 1 capsule (60 mg total) by mouth daily. 90 capsule 3   famotidine (PEPCID) 20 MG tablet One after supper 30 tablet 11   glucose blood (TRUE METRIX BLOOD GLUCOSE TEST) test strip Use as instructed 100 each 12   hydrochlorothiazide (HYDRODIURIL) 12.5 MG tablet Take 1 tablet (12.5 mg total) by mouth daily. 90 tablet 0   influenza vac split quadrivalent PF (FLUARIX) 0.5 ML injection Inject into  the muscle. 0.5 mL 0   Insulin Pen Needle (BD PEN NEEDLE MICRO U/F) 32G X 6 MM MISC One injection weekly. 100 each 0   lamoTRIgine (LAMICTAL) 25 MG tablet Take 1 tablet by mouth daily for 1 week, 1 tab twice daily for 1 week, 1 tablet three times daily for 1 week, then 2 tabs twice daily 120 tablet 5   lisinopril (ZESTRIL) 10 MG tablet Take 1 tablet by mouth daily.     nicotine (NICODERM CQ - DOSED IN MG/24 HOURS) 14 mg/24hr patch Place 1 patch (14 mg total) onto the skin daily. 28 patch 2   olmesartan (BENICAR) 40 MG tablet Take 1 tablet (40 mg total) by mouth daily. 90 tablet 0   pantoprazole  (PROTONIX) 40 MG tablet Take 1 tablet (40 mg total) by mouth daily. 90 tablet 0   traZODone (DESYREL) 100 MG tablet Take 1 tablet (100 mg total) by mouth at bedtime. 30 tablet 1   TRUEplus Lancets 28G MISC Use as directed 100 each 4   No current facility-administered medications on file prior to visit.    Allergies  Allergen Reactions   Amoxicillin Hives and Rash   Iodine Anaphylaxis and Swelling   Penicillins Hives, Itching and Rash   Shellfish-Derived Products Anaphylaxis   Erythromycin Swelling and Other (See Comments)   Metformin Diarrhea    Objective:  There were no vitals filed for this visit.  General: Well developed, nourished, in no acute distress, alert and oriented x3   Dermatology: Skin is warm, dry and supple bilateral.  Right hallux nail appears to be severely incurvated with hyperkeratosis formation at the distal aspects of the medial and lateral nail borders severe pincer nail deformity. (-) Erythema. (+) Edema. (-) serosanguous  drainage present. The remaining nails appear unremarkable at this time. There are no open sores, lesions or other signs of infection  present.  Vascular: Dorsalis Pedis artery and Posterior Tibial artery pedal pulses are 2/4 bilateral with immedate capillary fill time. Pedal hair growth present. No lower extremity edema.   Neruologic: Grossly intact via light touch bilateral.  Musculoskeletal: Tenderness to palpation right great toe and toenail.  X-ray right foot demonstrates a bone spur dorsal and medial distal phalanx  Assesement and Plan: Problem List Items Addressed This Visit       Endocrine   Type 2 diabetes mellitus (HCC)   Relevant Medications   lisinopril (ZESTRIL) 10 MG tablet   Other Visit Diagnoses     Bone spur of foot    -  Primary   Relevant Orders   DG Foot Complete Right   Pincer nail deformity       Toe pain, right           -Examined patient -X-rays reviewed -Discussed treatment options for severe  nail deformity with underlying bone spur -Patient elects at this time for surgical management.  Consent obtained for reduction of bone spur and removal of right hallux nail with use of phenol for permanent removal.  Discussed risks alternatives benefits no guarantees given or implied.  The surgery will be done outpatient at [facility].  Patient is aware that she must be in a surgical shoe while she is healing and must take at least 1 week off from work.  All questions answered to patient's satisfaction. -Surgical booking sheet completed -Patient to return to office after surgery or sooner if problems or issues arise. Landis Martins, DPM

## 2021-09-23 ENCOUNTER — Telehealth: Payer: Self-pay | Admitting: Urology

## 2021-09-23 NOTE — Telephone Encounter (Signed)
DOS - 09/30/21  EXC NAIL PERM 1ST RIGHT --- 11750 EXOSTECTOMY 1ST RIGHT --- 28108  [employer] EFFECTIVE DATE - 02/05/21  PLAN DEDUCTIBLE - $3,000.00 W/ $2,942.00  REMAINING OUT OF POCKET - $1,000.00 W/ $1,000.00 REMAINING COINSURANCE - 0% COPAY - $0.00  SPOKE WITH LISA WITH [employer] AND SHE STATED THAT FOR CPT CODES 49201 AND 00712 NO PRIOR AUTH IS REQUIRED.  REF # [id number]

## 2021-09-29 ENCOUNTER — Other Ambulatory Visit: Payer: Self-pay | Admitting: Sports Medicine

## 2021-09-29 DIAGNOSIS — Z9889 Other specified postprocedural states: Secondary | ICD-10-CM

## 2021-09-29 MED ORDER — HYDROCODONE-ACETAMINOPHEN 5-325 MG PO TABS
1.0000 | ORAL_TABLET | Freq: Four times a day (QID) | ORAL | 0 refills | Status: AC | PRN
Start: 2021-09-29 — End: 2021-10-05
  Filled 2021-09-29: qty 20, 5d supply, fill #0

## 2021-09-29 MED ORDER — PROMETHAZINE HCL 25 MG PO TABS
25.0000 mg | ORAL_TABLET | Freq: Three times a day (TID) | ORAL | 0 refills | Status: DC | PRN
  Filled 2021-09-29: qty 20, 7d supply, fill #0

## 2021-09-29 MED ORDER — IBUPROFEN 800 MG PO TABS
800.0000 mg | ORAL_TABLET | Freq: Three times a day (TID) | ORAL | 0 refills | Status: DC | PRN
  Filled 2021-09-29: qty 30, 10d supply, fill #0

## 2021-09-29 MED ORDER — DOCUSATE SODIUM 100 MG PO CAPS
100.0000 mg | ORAL_CAPSULE | Freq: Two times a day (BID) | ORAL | 0 refills | Status: DC
  Filled 2021-09-29: qty 100, 50d supply, fill #0

## 2021-09-29 NOTE — Progress Notes (Signed)
Post op meds entered Surgery 09/30/21

## 2021-09-30 ENCOUNTER — Other Ambulatory Visit (HOSPITAL_BASED_OUTPATIENT_CLINIC_OR_DEPARTMENT_OTHER): Payer: Self-pay

## 2021-09-30 ENCOUNTER — Encounter: Payer: Self-pay | Admitting: Sports Medicine

## 2021-09-30 DIAGNOSIS — L600 Ingrowing nail: Secondary | ICD-10-CM | POA: Diagnosis not present

## 2021-09-30 DIAGNOSIS — L608 Other nail disorders: Secondary | ICD-10-CM | POA: Diagnosis not present

## 2021-09-30 DIAGNOSIS — M25774 Osteophyte, right foot: Secondary | ICD-10-CM | POA: Diagnosis not present

## 2021-10-01 ENCOUNTER — Telehealth: Payer: Self-pay | Admitting: Sports Medicine

## 2021-10-01 NOTE — Telephone Encounter (Signed)
Post op check phone call made to patient.  Patient did not answer.  Left voicemail instructing patient to call office if she has any postoperative questions or concerns.  Also reminded patient on her voicemail of her follow-up appointment which is scheduled for 10/09/2021 at 9 AM.

## 2021-10-07 ENCOUNTER — Telehealth: Payer: Self-pay | Admitting: Neurology

## 2021-10-07 DIAGNOSIS — Z01818 Encounter for other preprocedural examination: Secondary | ICD-10-CM | POA: Diagnosis not present

## 2021-10-07 MED ORDER — ALPRAZOLAM 0.5 MG PO TABS: ORAL_TABLET | ORAL | 0 refills | Status: DC

## 2021-10-07 NOTE — Addendum Note (Signed)
Addended by: Britt Bottom on: 10/07/2021 03:20 PM   Modules accepted: Orders

## 2021-10-07 NOTE — Addendum Note (Signed)
Addended by: Wyvonnia Lora on: 10/07/2021 02:08 PM   Modules accepted: Orders

## 2021-10-07 NOTE — Telephone Encounter (Signed)
MR Lumbar spine wo contrast Dr. Cheree Ditto Josem Kaufmann: [id number] (exp. 10/07/21 to 12/05/21).   Patient is scheduled at [facility] for 10/09/21.  She also informed me she is claustrophobic and would like something to help her. She would like it to sent it to [facility].

## 2021-10-07 NOTE — Telephone Encounter (Signed)
Dr. Felecia Shelling- If you are ok w/ calling in xanax for pt, I have rx ready to e-scribe below to [facility] per pt request  Previous rx sent to [facility]. I called and LVM to cx if still on file. I checked drug registry. Does not show that she filled. She NS MRI appt 07/31/21

## 2021-10-09 ENCOUNTER — Encounter: Payer: Self-pay | Admitting: Sports Medicine

## 2021-10-09 ENCOUNTER — Ambulatory Visit (INDEPENDENT_AMBULATORY_CARE_PROVIDER_SITE_OTHER): Payer: BC Managed Care – PPO | Admitting: Sports Medicine

## 2021-10-09 ENCOUNTER — Other Ambulatory Visit: Payer: Self-pay | Admitting: Sports Medicine

## 2021-10-09 ENCOUNTER — Other Ambulatory Visit: Payer: Self-pay

## 2021-10-09 ENCOUNTER — Ambulatory Visit (INDEPENDENT_AMBULATORY_CARE_PROVIDER_SITE_OTHER): Payer: BC Managed Care – PPO

## 2021-10-09 VITALS — Temp 97.5°F

## 2021-10-09 DIAGNOSIS — M7751 Other enthesopathy of right foot: Secondary | ICD-10-CM

## 2021-10-09 DIAGNOSIS — M5431 Sciatica, right side: Secondary | ICD-10-CM

## 2021-10-09 DIAGNOSIS — M5416 Radiculopathy, lumbar region: Secondary | ICD-10-CM

## 2021-10-09 DIAGNOSIS — E119 Type 2 diabetes mellitus without complications: Secondary | ICD-10-CM

## 2021-10-09 DIAGNOSIS — L608 Other nail disorders: Secondary | ICD-10-CM

## 2021-10-09 DIAGNOSIS — M7750 Other enthesopathy of unspecified foot: Secondary | ICD-10-CM

## 2021-10-09 DIAGNOSIS — Z9889 Other specified postprocedural states: Secondary | ICD-10-CM

## 2021-10-09 DIAGNOSIS — M79674 Pain in right toe(s): Secondary | ICD-10-CM

## 2021-10-09 NOTE — Progress Notes (Signed)
Erroneous encounter

## 2021-10-09 NOTE — Progress Notes (Signed)
Subjective: ?Sheri [name] is a 46 y.o. female patient seen today in office for POV #1 date of surgery 09/30/2021 status post permanent removal of right hallux pincher nail and subungual exostosis using a rasp.  Patient admits that there is some pain and soreness and swelling at the great toe and some throbbing when she holds her foot down too long currently using surgical shoe but denies any other pedal complaints at this time. ? ?Patient Active Problem List  ? Diagnosis Date Noted  ? Severe obesity (BMI 35.0-39.9) with comorbidity ([place]) 09/11/2021  ? Meralgia paresthetica 03/08/2021  ? Neuropathy 01/02/2021  ? Bilateral sciatica 01/02/2021  ? Upper airway cough syndrome 11/08/2020  ? Cigarette smoker 11/08/2020  ? Type 2 diabetes mellitus ([place]) 10/18/2020  ? Hyperlipidemia 10/18/2020  ? Rectal bleeding 09/27/2020  ? Essential hypertension 09/27/2020  ? 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 Plantar fasciitis 03/03/2019  ? ? ?Current Outpatient Medications on File Prior to Visit  ?Medication Sig Dispense Refill  ? albuterol (VENTOLIN HFA) 108 (90 Base) MCG/ACT inhaler Inhale 2 puffs into the lungs every 6 (six) hours as needed for wheezing or shortness of breath. 8 g 0  ? Alcohol Swabs (ALCOHOL PREP) 70 % PADS One injection weekly. 100 each 1  ? ALPRAZolam (XANAX) 0.5 MG tablet May take 1 tablet 30 min prior to procedure and additional 1-2 tablet at time of procedure. Must have driver. 3 tablet 0  ? ALPRAZolam (XANAX) 0.5 MG tablet Take 1-2 tablets 30-60 min prior to MRI. Can take additional tablet at time of time if needed. Must have driver, can cause drowsiness. 3 tablet 0  ? amLODipine (NORVASC) 10 MG tablet Take 1 tablet (10 mg total) by mouth daily. 90 tablet 0  ? atorvastatin (LIPITOR) 20 MG tablet Take 1 tablet (20 mg total) by mouth daily. 120 tablet 0  ? Blood Glucose Monitoring Suppl (TRUE METRIX METER) w/Device KIT Use as directed 1 kit 0  ? buPROPion (WELLBUTRIN SR) 150 MG 12 hr tablet Take 1  tablet (150 mg total) by mouth 2 (two) times daily. 180 tablet 0  ? Continuous Blood Gluc Sensor (DEXCOM G6 SENSOR) MISC Use to check blood sugar 4 (four) times daily -  before meals and at bedtime. 3 each 8  ? Continuous Blood Gluc Sensor (FREESTYLE LIBRE 2 SENSOR) MISC Use as directed to check blood sugar 2 each 0  ? Continuous Blood Gluc Transmit (DEXCOM G6 TRANSMITTER) MISC Use to check blood sugar  4 (four) times daily -  before meals and at bedtime. 1 each 4  ? COVID-19 mRNA bivalent vaccine, Moderna, (MODERNA COVID-19 BIVAL BOOSTER) 50 MCG/0.5ML injection Inject into the muscle. 0.5 mL 0  ? docusate sodium (COLACE) 100 MG capsule Take 1 capsule (100 mg total) by mouth 2 (two) times daily. 100 capsule 0  ? Dulaglutide 0.75 MG/0.5ML SOPN Inject 0.75 mg into the skin once a week. 6 mL 0  ? DULoxetine (CYMBALTA) 60 MG capsule Take 1 capsule (60 mg total) by mouth daily. 90 capsule 3  ? famotidine (PEPCID) 20 MG tablet One after supper 30 tablet 11  ? glucose blood (TRUE METRIX BLOOD GLUCOSE TEST) test strip Use as instructed 100 each 12  ? hydrochlorothiazide (HYDRODIURIL) 12.5 MG tablet Take 1 tablet (12.5 mg total) by mouth daily. 90 tablet 0  ? ibuprofen (ADVIL) 800 MG tablet Take 1 tablet (800 mg total) by mouth every 8 (eight) hours as needed. 30 tablet 0  ? influenza  vac split quadrivalent PF (FLUARIX) 0.5 ML injection Inject into the muscle. 0.5 mL 0  ? Insulin Pen Needle (BD PEN NEEDLE MICRO U/F) 32G X 6 MM MISC One injection weekly. 100 each 0  ? lamoTRIgine (LAMICTAL) 25 MG tablet Take 1 tablet by mouth daily for 1 week, 1 tab twice daily for 1 week, 1 tablet three times daily for 1 week, then 2 tabs twice daily 120 tablet 5  ? lisinopril (ZESTRIL) 10 MG tablet Take 1 tablet by mouth daily.    ? nicotine (NICODERM CQ - DOSED IN MG/24 HOURS) 14 mg/24hr patch Place 1 patch (14 mg total) onto the skin daily. 28 patch 2  ? olmesartan (BENICAR) 40 MG tablet Take 1 tablet (40 mg total) by mouth daily. 90  tablet 0  ? pantoprazole (PROTONIX) 40 MG tablet Take 1 tablet (40 mg total) by mouth daily. 90 tablet 0  ? promethazine (PHENERGAN) 25 MG tablet Take 1 tablet (25 mg total) by mouth every 8 (eight) hours as needed for nausea or vomiting. 20 tablet 0  ? traZODone (DESYREL) 100 MG tablet Take 1 tablet (100 mg total) by mouth at bedtime. 30 tablet 1  ? TRUEplus Lancets 28G MISC Use as directed 100 each 4  ? ?No current facility-administered medications on file prior to visit.  ? ? ?Allergies  ?Allergen Reactions  ? Amoxicillin Hives and Rash  ? Iodine Anaphylaxis and Swelling  ? Penicillins Hives, Itching and Rash  ? Shellfish-Derived Products Anaphylaxis  ? Erythromycin Swelling and Other (See Comments)  ? Metformin Diarrhea  ? ? ?Objective: ?There were no vitals filed for this visit. ? ?General: No acute distress, AAOx3  ?Right foot: Sutures intact with no gapping or dehiscence at surgical site, wound bed at the right hallux nail bed appears to be granular with mild maceration at the proximal nail fold but otherwise the area appears to be healing well with no active drainage besides a small amount of blood no pus no malodor no significant redness or warmth.  Guarding due to pain at right great toe. ? ?Post Op Xray, right foot consistent with exostosis resection at the hallux and IPJ medially ? ?Assessment and Plan:  ?Problem List Items Addressed This Visit   ? ?  ? Endocrine  ? Type 2 diabetes mellitus ([place])  ? ?Other Visit Diagnoses   ? ? Bone spur of right foot    -  Primary  ? Relevant Orders  ? DG Foot Complete Right  ? Pincer nail deformity      ? Toe pain, right      ? S/P foot surgery, right      ? ?  ?  ? ?-Patient seen and evaluated ?-X-rays reviewed ?-Applied antibiotic cream and dry sterile dressing to surgical site right great toe and advised patient to keep intact but did provide her with dressings in case the dressing gets too tight and she needs to change her or falls off and she needs to replace  it ?-Advised patient to continue with rest ice elevation and postoperative shoe ?-Continue with PRN pain meds and encourage patient to take twice a day to help with any discomfort in the toe ?-We will plan for suture removal at next office visit and possibly allowing patient to soak in Epsom salt. ? ?Landis Martins, DPM ? ?

## 2021-10-14 ENCOUNTER — Other Ambulatory Visit (HOSPITAL_COMMUNITY): Payer: Self-pay

## 2021-10-15 ENCOUNTER — Other Ambulatory Visit (HOSPITAL_COMMUNITY): Payer: Self-pay

## 2021-10-16 ENCOUNTER — Other Ambulatory Visit: Payer: Self-pay

## 2021-10-16 ENCOUNTER — Other Ambulatory Visit (HOSPITAL_BASED_OUTPATIENT_CLINIC_OR_DEPARTMENT_OTHER): Payer: Self-pay

## 2021-10-16 ENCOUNTER — Other Ambulatory Visit (HOSPITAL_COMMUNITY): Payer: Self-pay

## 2021-10-16 ENCOUNTER — Ambulatory Visit (INDEPENDENT_AMBULATORY_CARE_PROVIDER_SITE_OTHER): Payer: BC Managed Care – PPO | Admitting: Sports Medicine

## 2021-10-16 ENCOUNTER — Encounter: Payer: Self-pay | Admitting: Sports Medicine

## 2021-10-16 DIAGNOSIS — Z9889 Other specified postprocedural states: Secondary | ICD-10-CM

## 2021-10-16 DIAGNOSIS — M7751 Other enthesopathy of right foot: Secondary | ICD-10-CM

## 2021-10-16 DIAGNOSIS — L608 Other nail disorders: Secondary | ICD-10-CM

## 2021-10-16 DIAGNOSIS — E119 Type 2 diabetes mellitus without complications: Secondary | ICD-10-CM

## 2021-10-16 DIAGNOSIS — M79674 Pain in right toe(s): Secondary | ICD-10-CM

## 2021-10-16 MED ORDER — BACITRACIN-NEOMYCIN-POLYMYXIN 400-5-5000 EX OINT: 1.0000 "application " | TOPICAL_OINTMENT | Freq: Two times a day (BID) | CUTANEOUS | 0 refills | Status: DC

## 2021-10-16 MED ORDER — HYDROCODONE-ACETAMINOPHEN 5-325 MG PO TABS
1.0000 | ORAL_TABLET | Freq: Four times a day (QID) | ORAL | 0 refills | Status: AC | PRN
Start: 2021-10-16 — End: 2021-10-21

## 2021-10-16 NOTE — Progress Notes (Signed)
Subjective: ?Sheri [name] is a 46 y.o. female patient seen today in office for POV #2 date of surgery 09/30/2021 status post permanent removal of right hallux pincher nail and subungual exostosis using a rasp.  Patient admits that there is some pain at the surgical site and it throbs and is very touchy at times has to keep her dogs away from the toe.  Reports that she did take a shower this morning and accidentally got it wet.  Patient denies any other pedal complaints at this time. ? ?Patient Active Problem List  ? Diagnosis Date Noted  ? Severe obesity (BMI 35.0-39.9) with comorbidity ([place]) 09/11/2021  ? Meralgia paresthetica 03/08/2021  ? Neuropathy 01/02/2021  ? Bilateral sciatica 01/02/2021  ? Upper airway cough syndrome 11/08/2020  ? Cigarette smoker 11/08/2020  ? Type 2 diabetes mellitus ([place]) 10/18/2020  ? Hyperlipidemia 10/18/2020  ? Rectal bleeding 09/27/2020  ? Essential hypertension 09/27/2020  ? 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 Plantar fasciitis 03/03/2019  ? ? ?Current Outpatient Medications on File Prior to Visit  ?Medication Sig Dispense Refill  ? albuterol (VENTOLIN HFA) 108 (90 Base) MCG/ACT inhaler Inhale 2 puffs into the lungs every 6 (six) hours as needed for wheezing or shortness of breath. 8 g 0  ? Alcohol Swabs (ALCOHOL PREP) 70 % PADS One injection weekly. 100 each 1  ? ALPRAZolam (XANAX) 0.5 MG tablet May take 1 tablet 30 min prior to procedure and additional 1-2 tablet at time of procedure. Must have driver. 3 tablet 0  ? ALPRAZolam (XANAX) 0.5 MG tablet Take 1-2 tablets 30-60 min prior to MRI. Can take additional tablet at time of time if needed. Must have driver, can cause drowsiness. 3 tablet 0  ? amLODipine (NORVASC) 10 MG tablet Take 1 tablet (10 mg total) by mouth daily. 90 tablet 0  ? atorvastatin (LIPITOR) 20 MG tablet Take 1 tablet (20 mg total) by mouth daily. 120 tablet 0  ? Blood Glucose Monitoring Suppl (TRUE METRIX METER) w/Device KIT Use as directed 1 kit 0  ?  buPROPion (WELLBUTRIN SR) 150 MG 12 hr tablet Take 1 tablet (150 mg total) by mouth 2 (two) times daily. 180 tablet 0  ? Continuous Blood Gluc Sensor (DEXCOM G6 SENSOR) MISC Use to check blood sugar 4 (four) times daily -  before meals and at bedtime. 3 each 8  ? Continuous Blood Gluc Sensor (FREESTYLE LIBRE 2 SENSOR) MISC Use as directed to check blood sugar 2 each 0  ? Continuous Blood Gluc Transmit (DEXCOM G6 TRANSMITTER) MISC Use to check blood sugar  4 (four) times daily -  before meals and at bedtime. 1 each 4  ? COVID-19 mRNA bivalent vaccine, Moderna, (MODERNA COVID-19 BIVAL BOOSTER) 50 MCG/0.5ML injection Inject into the muscle. 0.5 mL 0  ? docusate sodium (COLACE) 100 MG capsule Take 1 capsule (100 mg total) by mouth 2 (two) times daily. 100 capsule 0  ? Dulaglutide 0.75 MG/0.5ML SOPN Inject 0.75 mg into the skin once a week. 6 mL 0  ? DULoxetine (CYMBALTA) 60 MG capsule Take 1 capsule (60 mg total) by mouth daily. 90 capsule 3  ? famotidine (PEPCID) 20 MG tablet One after supper 30 tablet 11  ? glucose blood (TRUE METRIX BLOOD GLUCOSE TEST) test strip Use as instructed 100 each 12  ? hydrochlorothiazide (HYDRODIURIL) 12.5 MG tablet Take 1 tablet (12.5 mg total) by mouth daily. 90 tablet 0  ? ibuprofen (ADVIL) 800 MG tablet Take 1 tablet (800 mg  total) by mouth every 8 (eight) hours as needed. 30 tablet 0  ? influenza vac split quadrivalent PF (FLUARIX) 0.5 ML injection Inject into the muscle. 0.5 mL 0  ? Insulin Pen Needle (BD PEN NEEDLE MICRO U/F) 32G X 6 MM MISC One injection weekly. 100 each 0  ? lamoTRIgine (LAMICTAL) 25 MG tablet Take 1 tablet by mouth daily for 1 week, 1 tab twice daily for 1 week, 1 tablet three times daily for 1 week, then 2 tabs twice daily 120 tablet 5  ? lisinopril (ZESTRIL) 10 MG tablet Take 1 tablet by mouth daily.    ? nicotine (NICODERM CQ - DOSED IN MG/24 HOURS) 14 mg/24hr patch Place 1 patch onto the skin daily. 28 patch 2  ? olmesartan (BENICAR) 40 MG tablet Take 1  tablet (40 mg total) by mouth daily. 90 tablet 0  ? pantoprazole (PROTONIX) 40 MG tablet Take 1 tablet (40 mg total) by mouth daily. 90 tablet 0  ? promethazine (PHENERGAN) 25 MG tablet Take 1 tablet (25 mg total) by mouth every 8 (eight) hours as needed for nausea or vomiting. 20 tablet 0  ? traZODone (DESYREL) 100 MG tablet Take 1 tablet (100 mg total) by mouth at bedtime. 30 tablet 1  ? TRUEplus Lancets 28G MISC Use as directed 100 each 4  ? ?No current facility-administered medications on file prior to visit.  ? ? ?Allergies  ?Allergen Reactions  ? Amoxicillin Hives and Rash  ? Iodine Anaphylaxis and Swelling  ? Penicillins Hives, Itching and Rash  ? Shellfish-Derived Products Anaphylaxis  ? Erythromycin Swelling and Other (See Comments)  ? Metformin Diarrhea  ? ? ?Objective: ?There were no vitals filed for this visit. ? ?General: No acute distress, AAOx3  ?Right foot: Sutures intact with no gapping or dehiscence at surgical site, wound bed at the right hallux nail bed appears to be granular with mild maceration at the proximal nail fold and plantar distal tuft of the toe from patient getting it wet this morning shower but otherwise the area appears to be healing well with no active drainage besides a small amount of blood no pus no malodor no significant redness or warmth.  Guarding due to pain at right great toe. ? ? ?Assessment and Plan:  ?Problem List Items Addressed This Visit   ? ?  ? Endocrine  ? Type 2 diabetes mellitus ([place])  ? ?Other Visit Diagnoses   ? ? Bone spur of right foot    -  Primary  ? Pincer nail deformity      ? Toe pain, right      ? S/P foot surgery, right      ? ?  ?  ? ?-Patient seen and evaluated ?-Sutures removed  ?-Re-applied antibiotic cream and dry sterile dressing to surgical site right great toe and advised patient to keep intact for today but tomorrow may remove dressing and soak with Epsom salt and apply antibiotic cream ?-Advised patient to continue with rest ice elevation and  postoperative shoe ?-Continue with PRN pain meds and encourage patient to take twice a day to help with any discomfort in the to; refill Norco 12/12/2023 this visit ?-Continue with surgical shoe ?-FMLA paperwork completed estimated return to work for [employer] ?-We will plan for toe check at next office visit ? ?Landis Martins, DPM ? ?

## 2021-10-18 ENCOUNTER — Encounter: Payer: BC Managed Care – PPO | Admitting: Family

## 2021-10-18 DIAGNOSIS — Z1329 Encounter for screening for other suspected endocrine disorder: Secondary | ICD-10-CM

## 2021-10-18 DIAGNOSIS — E785 Hyperlipidemia, unspecified: Secondary | ICD-10-CM

## 2021-10-18 DIAGNOSIS — E119 Type 2 diabetes mellitus without complications: Secondary | ICD-10-CM

## 2021-10-18 DIAGNOSIS — E1142 Type 2 diabetes mellitus with diabetic polyneuropathy: Secondary | ICD-10-CM

## 2021-10-18 DIAGNOSIS — Z1231 Encounter for screening mammogram for malignant neoplasm of breast: Secondary | ICD-10-CM

## 2021-10-18 DIAGNOSIS — Z130 Encounter for screening for diseases of the blood and blood-forming organs and certain disorders involving the immune mechanism: Secondary | ICD-10-CM

## 2021-10-18 DIAGNOSIS — Z113 Encounter for screening for infections with a predominantly sexual mode of transmission: Secondary | ICD-10-CM

## 2021-10-18 DIAGNOSIS — Z0000 Encounter for general adult medical examination without abnormal findings: Secondary | ICD-10-CM

## 2021-10-18 DIAGNOSIS — Z124 Encounter for screening for malignant neoplasm of cervix: Secondary | ICD-10-CM

## 2021-10-18 DIAGNOSIS — Z13228 Encounter for screening for other metabolic disorders: Secondary | ICD-10-CM

## 2021-10-18 NOTE — Progress Notes (Unsigned)
Patient ID: Sheri [name], female    DOB: Dec 18, 1975  MRN: [id number]  CC: PAP Smear   Subjective: Sheri [name] is a 46 y.o. female who presents for PAP smear.   Her concerns today include:  PAP Smear:  ENCOUNTER FORM COMPLETION: 09/09/2021: - Blood pressure at goal today in office. Home blood pressures the same.  - Continue Olmesartan, Amlodipine, and Hydrochlorothiazide as prescribed. No refills needed as of present.  - Patient cleared from primary care standpoint, in regards to blood pressure, for dental cleaning. Patient plans to have dentist fax over letterhead and required documentation for primary provider to complete prior to procedure.   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 8 g 0   Alcohol Swabs (ALCOHOL PREP) 70 % PADS One injection weekly. 100 each 1   ALPRAZolam (XANAX) 0.5 MG tablet May take 1 tablet 30 min prior to procedure and additional 1-2 tablet at time of procedure. Must have driver. 3 tablet 0   ALPRAZolam (XANAX) 0.5 MG tablet Take 1-2 tablets 30-60 min prior to MRI. Can take additional tablet at time of time if needed. Must have driver, can cause drowsiness. 3 tablet 0   amLODipine (NORVASC) 10 MG tablet Take 1 tablet (10 mg total) by mouth daily.  90 tablet 0   atorvastatin (LIPITOR) 20 MG tablet Take 1 tablet (20 mg total) by mouth daily. 120 tablet 0   Blood Glucose Monitoring Suppl (TRUE METRIX METER) w/Device KIT Use as directed 1 kit 0   buPROPion (WELLBUTRIN SR) 150 MG 12 hr tablet Take 1 tablet (150 mg total) by mouth 2 (two) times daily. 180 tablet 0   Continuous Blood Gluc Sensor (DEXCOM G6 SENSOR) MISC Use to check blood sugar 4 (four) times daily -  before meals and at bedtime. 3 each 8   Continuous Blood Gluc Sensor (FREESTYLE LIBRE 2 SENSOR) MISC Use as directed to check blood sugar 2 each 0   Continuous Blood Gluc Transmit (DEXCOM G6 TRANSMITTER) MISC Use to check blood sugar  4 (four) times daily -  before meals and at bedtime. 1 each 4   COVID-19 mRNA bivalent vaccine, Moderna, (MODERNA COVID-19 BIVAL BOOSTER) 50 MCG/0.5ML injection Inject into the muscle. 0.5 mL 0   docusate sodium (COLACE) 100 MG capsule Take 1 capsule (100 mg total) by mouth 2 (two) times daily. 100 capsule 0   Dulaglutide 0.75 MG/0.5ML SOPN Inject 0.75 mg into the skin once a week. 6 mL 0   DULoxetine (CYMBALTA) 60 MG capsule Take 1 capsule (60 mg total) by mouth daily. 90 capsule 3   famotidine (PEPCID) 20 MG tablet One after supper 30 tablet 11   glucose blood (TRUE METRIX BLOOD GLUCOSE TEST) test strip Use as instructed 100 each 12   hydrochlorothiazide (HYDRODIURIL) 12.5  MG tablet Take 1 tablet (12.5 mg total) by mouth daily. 90 tablet 0   HYDROcodone-acetaminophen (NORCO) 5-325 MG tablet Take 1 tablet by mouth every 6 (six) hours as needed for up to 5 days for moderate pain. 20 tablet 0   ibuprofen (ADVIL) 800 MG tablet Take 1 tablet (800 mg total) by mouth every 8 (eight) hours as needed. 30 tablet 0   influenza vac split quadrivalent PF (FLUARIX) 0.5 ML injection Inject into the muscle. 0.5 mL 0   Insulin Pen Needle (BD PEN NEEDLE MICRO U/F) 32G X 6 MM MISC One injection weekly. 100 each 0   lamoTRIgine (LAMICTAL) 25 MG tablet Take 1 tablet by  mouth daily for 1 week, 1 tab twice daily for 1 week, 1 tablet three times daily for 1 week, then 2 tabs twice daily 120 tablet 5   lisinopril (ZESTRIL) 10 MG tablet Take 1 tablet by mouth daily.     neomycin-bacitracin-polymyxin (NEOSPORIN) ointment Apply 1 application. topically every 12 (twelve) hours. 15 g 0   nicotine (NICODERM CQ - DOSED IN MG/24 HOURS) 14 mg/24hr patch Place 1 patch onto the skin daily. 28 patch 2   olmesartan (BENICAR) 40 MG tablet Take 1 tablet (40 mg total) by mouth daily. 90 tablet 0   pantoprazole (PROTONIX) 40 MG tablet Take 1 tablet (40 mg total) by mouth daily. 90 tablet 0   promethazine (PHENERGAN) 25 MG tablet Take 1 tablet (25 mg total) by mouth every 8 (eight) hours as needed for nausea or vomiting. 20 tablet 0   traZODone (DESYREL) 100 MG tablet Take 1 tablet (100 mg total) by mouth at bedtime. 30 tablet 1   TRUEplus Lancets 28G MISC Use as directed 100 each 4   No current facility-administered medications on file prior to visit.    Allergies  Allergen Reactions   Amoxicillin Hives and Rash   Iodine Anaphylaxis and Swelling   Penicillins Hives, Itching and Rash   Shellfish-Derived Products Anaphylaxis   Erythromycin Swelling and Other (See Comments)   Metformin Diarrhea    Social History   Socioeconomic History   Marital status: Single    Spouse name: Not on file   Number of children: Not on file   Years of education: Not on file   Highest education level: High school graduate  Occupational History   Not on file  Tobacco Use   Smoking status: Some Days    Packs/day: 0.50    Years: 22.00    Pack years: 11.00    Types: Cigarettes   Smokeless tobacco: Never   Tobacco comments:    4-5 cigs per day 11/08/20//lmr  Vaping Use   Vaping Use: Some days  Substance and Sexual Activity   Alcohol use: Yes    Comment: occasional   Drug use: Not Currently   Sexual activity: Never    Birth control/protection: None  Other Topics Concern   Not on  file  Social History Narrative   ** Merged History Encounter **    Lives alone   Right handed   Caffeine: coffee every morning and tea.    Social Determinants of Health   Financial Resource Strain: Not on file  Food Insecurity: Not on file  Transportation Needs: Not on file  Physical Activity: Not on file  Stress: Not on file  Social Connections: Not on file  Intimate Partner Violence: Not on file    Family History  Problem Relation Age of Onset   Hypertension Mother  Diabetes Mother    Diabetes Father    Hypertension Father    Colon cancer Maternal Grandfather    Diabetes Paternal Grandmother    Rectal cancer Neg Hx    Stomach cancer Neg Hx    Esophageal cancer Neg Hx     Past Surgical History:  Procedure Laterality Date   BASAL CELL CARCINOMA EXCISION     ed records 07/26/2020   CERVICAL DISC SURGERY      ROS: Review of Systems Negative except as stated above  PHYSICAL EXAM: There were no vitals taken for this visit.  Physical Exam  {female adult master:310786} {female adult master:310785}  CMP Latest Ref Rng & Units 07/23/2021 10/17/2020 10/01/2020  Glucose 70 - 99 mg/dL 128(H) - 107(H)  BUN 6 - 24 mg/dL 12 - 12  Creatinine 0.57 - 1.00 mg/dL 1.05(H) - 0.78  Sodium 134 - 144 mmol/L 145(H) - 140  Potassium 3.5 - 5.2 mmol/L 3.9 - 4.2  Chloride 96 - 106 mmol/L 104 - 101  CO2 20 - 29 mmol/L 24 - 23  Calcium 8.7 - 10.2 mg/dL 9.9 - 9.7  Total Protein 6.0 - 8.5 g/dL - 7.7 -  Total Bilirubin 0.0 - 1.2 mg/dL - 0.5 -  Alkaline Phos 44 - 121 IU/L - 105 -  AST 0 - 40 IU/L - 28 -  ALT 0 - 32 IU/L - 30 -   Lipid Panel     Component Value Date/Time   CHOL 276 (H) 10/17/2020 1151   TRIG 201 (H) 10/17/2020 1151   HDL 54 10/17/2020 1151   CHOLHDL 5.1 (H) 10/17/2020 1151   LDLCALC 184 (H) 10/17/2020 1151    CBC    Component Value Date/Time   WBC 11.8 (H) 10/17/2020 1151   RBC 5.08 10/17/2020 1151   HGB 14.7 10/17/2020 1151   HCT 43.8 10/17/2020 1151   PLT  304 10/17/2020 1151   MCV 86 10/17/2020 1151   MCH 28.9 10/17/2020 1151   MCHC 33.6 10/17/2020 1151   RDW 13.1 10/17/2020 1151    ASSESSMENT AND PLAN:  There are no diagnoses linked to this encounter.   Patient was given the opportunity to ask questions.  Patient verbalized understanding of the plan and was able to repeat key elements of the plan. Patient was given clear instructions to go to Emergency Department or return to medical center if symptoms don't improve, worsen, or new problems develop.The patient verbalized understanding.   No orders of the defined types were placed in this encounter.    Requested Prescriptions    No prescriptions requested or ordered in this encounter    No follow-ups on file.  Camillia Herter, NP

## 2021-10-22 ENCOUNTER — Ambulatory Visit: Payer: BC Managed Care – PPO | Admitting: Family

## 2021-10-22 ENCOUNTER — Other Ambulatory Visit: Payer: Self-pay

## 2021-10-22 ENCOUNTER — Encounter: Payer: Self-pay | Admitting: Family

## 2021-10-22 VITALS — BP 152/76 | HR 90 | Temp 98.3°F | Resp 18 | Ht 67.99 in | Wt 257.0 lb

## 2021-10-22 DIAGNOSIS — Z113 Encounter for screening for infections with a predominantly sexual mode of transmission: Secondary | ICD-10-CM

## 2021-10-22 DIAGNOSIS — Z0289 Encounter for other administrative examinations: Secondary | ICD-10-CM

## 2021-10-22 DIAGNOSIS — I10 Essential (primary) hypertension: Secondary | ICD-10-CM

## 2021-10-22 DIAGNOSIS — Z0001 Encounter for general adult medical examination with abnormal findings: Secondary | ICD-10-CM | POA: Diagnosis not present

## 2021-10-22 DIAGNOSIS — E785 Hyperlipidemia, unspecified: Secondary | ICD-10-CM

## 2021-10-22 DIAGNOSIS — E1142 Type 2 diabetes mellitus with diabetic polyneuropathy: Secondary | ICD-10-CM

## 2021-10-22 DIAGNOSIS — Z0000 Encounter for general adult medical examination without abnormal findings: Secondary | ICD-10-CM

## 2021-10-22 DIAGNOSIS — Z124 Encounter for screening for malignant neoplasm of cervix: Secondary | ICD-10-CM

## 2021-10-22 LAB — POCT GLYCOSYLATED HEMOGLOBIN (HGB A1C): Hemoglobin A1C: 7.8 % — AB (ref 4.0–5.6)

## 2021-10-22 MED ORDER — AMLODIPINE BESYLATE 10 MG PO TABS: 10.0000 mg | ORAL_TABLET | Freq: Every day | ORAL | 0 refills | Status: DC

## 2021-10-22 MED ORDER — BD PEN NEEDLE MICRO U/F 32G X 6 MM MISC: 0 refills | Status: DC

## 2021-10-22 MED ORDER — OLMESARTAN MEDOXOMIL 40 MG PO TABS: 40.0000 mg | ORAL_TABLET | Freq: Every day | ORAL | 0 refills | Status: DC

## 2021-10-22 MED ORDER — HYDROCHLOROTHIAZIDE 12.5 MG PO TABS: 12.5000 mg | ORAL_TABLET | Freq: Every day | ORAL | 0 refills | Status: DC

## 2021-10-22 MED ORDER — SEMAGLUTIDE(0.25 OR 0.5MG/DOS) 2 MG/1.5ML ~~LOC~~ SOPN: 0.2500 mg | PEN_INJECTOR | SUBCUTANEOUS | 0 refills | Status: AC

## 2021-10-22 NOTE — Progress Notes (Signed)
Diabetes discussed in office.

## 2021-10-22 NOTE — Patient Instructions (Signed)

## 2021-10-22 NOTE — Progress Notes (Signed)
Pt presents for annual physical exam declined pap smear  ?Needs form signed for upcoming dental procedure  ?A1c 6.6% 07/23/21 up to 7.8% today ?

## 2021-10-23 ENCOUNTER — Other Ambulatory Visit: Payer: Self-pay | Admitting: Family

## 2021-10-23 DIAGNOSIS — E785 Hyperlipidemia, unspecified: Secondary | ICD-10-CM

## 2021-10-23 LAB — LIPID PANEL
Chol/HDL Ratio: 4.9 ratio — ABNORMAL HIGH (ref 0.0–4.4)
Cholesterol, Total: 217 mg/dL — ABNORMAL HIGH (ref 100–199)
HDL: 44 mg/dL (ref >39)
LDL Chol Calc (NIH): 134 mg/dL — ABNORMAL HIGH (ref 0–99)
Triglycerides: 220 mg/dL — ABNORMAL HIGH (ref 0–149)
VLDL Cholesterol Cal: 39 mg/dL (ref 5–40)

## 2021-10-23 MED ORDER — ATORVASTATIN CALCIUM 40 MG PO TABS: 40.0000 mg | ORAL_TABLET | Freq: Every day | ORAL | 1 refills | Status: DC

## 2021-10-23 NOTE — Progress Notes (Signed)
Cholesterol remaining higher than normal. However, improved since 12 months ago. Increase Atorvastatin to 40 mg daily. Practice low-fat heart healthy diet and at least 150 minutes of moderate intensity exercise weekly as tolerated.  ?

## 2021-10-28 ENCOUNTER — Encounter: Payer: Self-pay | Admitting: General Practice

## 2021-10-30 ENCOUNTER — Ambulatory Visit (INDEPENDENT_AMBULATORY_CARE_PROVIDER_SITE_OTHER): Payer: BC Managed Care – PPO | Admitting: Sports Medicine

## 2021-10-30 ENCOUNTER — Other Ambulatory Visit: Payer: Self-pay

## 2021-10-30 ENCOUNTER — Encounter: Payer: Self-pay | Admitting: Sports Medicine

## 2021-10-30 DIAGNOSIS — Z9889 Other specified postprocedural states: Secondary | ICD-10-CM

## 2021-10-30 DIAGNOSIS — M7751 Other enthesopathy of right foot: Secondary | ICD-10-CM

## 2021-10-30 DIAGNOSIS — E119 Type 2 diabetes mellitus without complications: Secondary | ICD-10-CM

## 2021-10-30 DIAGNOSIS — L608 Other nail disorders: Secondary | ICD-10-CM

## 2021-10-30 DIAGNOSIS — M79674 Pain in right toe(s): Secondary | ICD-10-CM

## 2021-10-30 NOTE — Progress Notes (Signed)
Subjective: ?Sheri [name] is a 46 y.o. female patient seen today in office for POV # 3 date of surgery 09/30/2021 status post permanent removal of right hallux pincher nail and subungual exostosis using a rasp.  Patient admits still has pain and drainage about the same as last visit.  Patient states that she cannot wear a normal shoe still is very painful and requires surgical shoe. ? ?Patient brought in duty paperwork for me to complete on her behalf. ? ?Patient Active Problem List  ? Diagnosis Date Noted  ? Severe obesity (BMI 35.0-39.9) with comorbidity ([place]) 09/11/2021  ? Meralgia paresthetica 03/08/2021  ? Neuropathy 01/02/2021  ? Bilateral sciatica 01/02/2021  ? Upper airway cough syndrome 11/08/2020  ? Cigarette smoker 11/08/2020  ? Type 2 diabetes mellitus ([place]) 10/18/2020  ? Hyperlipidemia 10/18/2020  ? Rectal bleeding 09/27/2020  ? Essential hypertension 09/27/2020  ? 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 Plantar fasciitis 03/03/2019  ? ? ?Current Outpatient Medications on File Prior to Visit  ?Medication Sig Dispense Refill  ? albuterol (VENTOLIN HFA) 108 (90 Base) MCG/ACT inhaler Inhale 2 puffs into the lungs every 6 (six) hours as needed for wheezing or shortness of breath. 8 g 0  ? Alcohol Swabs (ALCOHOL PREP) 70 % PADS One injection weekly. 100 each 1  ? amLODipine (NORVASC) 10 MG tablet Take 1 tablet (10 mg total) by mouth daily. 90 tablet 0  ? atorvastatin (LIPITOR) 40 MG tablet Take 1 tablet (40 mg total) by mouth daily. 90 tablet 1  ? Blood Glucose Monitoring Suppl (TRUE METRIX METER) w/Device KIT Use as directed 1 kit 0  ? buPROPion (WELLBUTRIN SR) 150 MG 12 hr tablet Take 1 tablet (150 mg total) by mouth 2 (two) times daily. 180 tablet 0  ? Continuous Blood Gluc Sensor (DEXCOM G6 SENSOR) MISC Use to check blood sugar 4 (four) times daily -  before meals and at bedtime. 3 each 8  ? Continuous Blood Gluc Sensor (FREESTYLE LIBRE 2 SENSOR) MISC Use as directed to check blood sugar 2 each 0   ? Continuous Blood Gluc Transmit (DEXCOM G6 TRANSMITTER) MISC Use to check blood sugar  4 (four) times daily -  before meals and at bedtime. 1 each 4  ? COVID-19 mRNA bivalent vaccine, Moderna, (MODERNA COVID-19 BIVAL BOOSTER) 50 MCG/0.5ML injection Inject into the muscle. 0.5 mL 0  ? docusate sodium (COLACE) 100 MG capsule Take 1 capsule (100 mg total) by mouth 2 (two) times daily. 100 capsule 0  ? DULoxetine (CYMBALTA) 60 MG capsule Take 1 capsule (60 mg total) by mouth daily. 90 capsule 3  ? famotidine (PEPCID) 20 MG tablet One after supper 30 tablet 11  ? glucose blood (TRUE METRIX BLOOD GLUCOSE TEST) test strip Use as instructed 100 each 12  ? hydrochlorothiazide (HYDRODIURIL) 12.5 MG tablet Take 1 tablet (12.5 mg total) by mouth daily. 90 tablet 0  ? ibuprofen (ADVIL) 800 MG tablet Take 1 tablet (800 mg total) by mouth every 8 (eight) hours as needed. 30 tablet 0  ? influenza vac split quadrivalent PF (FLUARIX) 0.5 ML injection Inject into the muscle. 0.5 mL 0  ? Insulin Pen Needle (BD PEN NEEDLE MICRO U/F) 32G X 6 MM MISC One injection weekly. 100 each 0  ? lamoTRIgine (LAMICTAL) 25 MG tablet Take 1 tablet by mouth daily for 1 week, 1 tab twice daily for 1 week, 1 tablet three times daily for 1 week, then 2 tabs twice daily 120 tablet 5  ?  neomycin-bacitracin-polymyxin (NEOSPORIN) ointment Apply 1 application. topically every 12 (twelve) hours. 15 g 0  ? nicotine (NICODERM CQ - DOSED IN MG/24 HOURS) 14 mg/24hr patch Place 1 patch onto the skin daily. 28 patch 2  ? olmesartan (BENICAR) 40 MG tablet Take 1 tablet (40 mg total) by mouth daily. 90 tablet 0  ? pantoprazole (PROTONIX) 40 MG tablet Take 1 tablet (40 mg total) by mouth daily. 90 tablet 0  ? promethazine (PHENERGAN) 25 MG tablet Take 1 tablet (25 mg total) by mouth every 8 (eight) hours as needed for nausea or vomiting. 20 tablet 0  ? Semaglutide,0.25 or 0.5MG/DOS, 2 MG/1.5ML SOPN Inject 0.25 mg into the skin once a week. 0.75 mL 0  ? traZODone  (DESYREL) 100 MG tablet Take 1 tablet (100 mg total) by mouth at bedtime. 30 tablet 1  ? TRUEplus Lancets 28G MISC Use as directed 100 each 4  ? ?No current facility-administered medications on file prior to visit.  ? ? ?Allergies  ?Allergen Reactions  ? Amoxicillin Hives and Rash  ? Iodine Anaphylaxis and Swelling  ? Penicillins Hives, Itching and Rash  ? Shellfish-Derived Products Anaphylaxis  ? Erythromycin Swelling and Other (See Comments)  ? Metformin Diarrhea  ? ? ?Objective: ?There were no vitals filed for this visit. ? ?General: No acute distress, AAOx3  ?Right foot: To the right hallux nail bed appears to be granular with resolved maceration at the proximal nail fold minimal small amount of blood in the nailbed no pus no malodor no significant redness or warmth.  Guarding due to pain at right great toe. ? ? ?Assessment and Plan:  ?Problem List Items Addressed This Visit   ? ?  ? Endocrine  ? Type 2 diabetes mellitus ([place])  ? ?Other Visit Diagnoses   ? ? Bone spur of right foot    -  Primary  ? Pincer nail deformity      ? Toe pain, right      ? S/P foot surgery, right      ? ?  ?  ? ?-Patient seen and evaluated ?-Cleansed right great toe and applied a small amount of silver nitrate and dry sterile dressing to surgical site right great toe and advised patient to keep intact for today but tomorrow may remove dressing and soak with Epsom salt and to discontinue antibiotic cream ?-Advised patient to continue with rest ice elevation and postoperative shoe and we will plan to keep her out of work at this time since she cannot wear her a normal shoe and still has pain at the toe ?-Continue with hydrocodone as she has at home as needed for pain ?-Continue with surgical shoe ?-FMLA paperwork completed estimated return to work for 11/21/2021 ?-We will plan for toe check at next office visit ? ?Landis Martins, DPM ? ?

## 2021-11-01 NOTE — Progress Notes (Signed)
Erroneous encounter

## 2021-11-06 ENCOUNTER — Telehealth: Payer: Self-pay | Admitting: Sports Medicine

## 2021-11-06 ENCOUNTER — Other Ambulatory Visit: Payer: Self-pay | Admitting: Sports Medicine

## 2021-11-06 MED ORDER — IBUPROFEN 800 MG PO TABS: 800.0000 mg | ORAL_TABLET | Freq: Three times a day (TID) | ORAL | 0 refills | Status: DC | PRN

## 2021-11-06 MED ORDER — HYDROCODONE-ACETAMINOPHEN 5-325 MG PO TABS
1.0000 | ORAL_TABLET | Freq: Four times a day (QID) | ORAL | 0 refills | Status: AC | PRN
Start: 2021-11-06 — End: 2021-11-11

## 2021-11-06 NOTE — Progress Notes (Signed)
Refilled post op pain meds ?

## 2021-11-06 NOTE — Telephone Encounter (Signed)
Pharmacy : [facility]   Medication : ibuprofen (ADVIL) 800 MG tablet    HYDROcodone-acetaminophen

## 2021-11-07 ENCOUNTER — Telehealth: Payer: Self-pay | Admitting: Sports Medicine

## 2021-11-07 NOTE — Telephone Encounter (Signed)
Patient would like a letter to return to work with light duty on 11/12/2021. Her light duty would consist of wearing a boot at work and limited standing. She was also asking about paper from her disability, to be updated with her new return to work date.  ?

## 2021-11-08 ENCOUNTER — Encounter: Payer: Self-pay | Admitting: Sports Medicine

## 2021-11-08 ENCOUNTER — Encounter: Payer: BC Managed Care – PPO | Admitting: Family

## 2021-11-08 DIAGNOSIS — I10 Essential (primary) hypertension: Secondary | ICD-10-CM

## 2021-11-08 NOTE — Telephone Encounter (Signed)
Patient called this morning, she is going to email the [facility] paperwork over to [employer] to get completed.

## 2021-11-08 NOTE — Telephone Encounter (Signed)
Spoke with pt, letter was emailed  ?

## 2021-11-19 ENCOUNTER — Encounter: Payer: Self-pay | Admitting: Neurology

## 2021-11-19 ENCOUNTER — Ambulatory Visit: Payer: BC Managed Care – PPO | Admitting: Neurology

## 2021-11-20 ENCOUNTER — Encounter: Payer: Self-pay | Admitting: Sports Medicine

## 2021-11-20 ENCOUNTER — Ambulatory Visit (INDEPENDENT_AMBULATORY_CARE_PROVIDER_SITE_OTHER): Payer: BC Managed Care – PPO | Admitting: Sports Medicine

## 2021-11-20 DIAGNOSIS — E119 Type 2 diabetes mellitus without complications: Secondary | ICD-10-CM

## 2021-11-20 DIAGNOSIS — L608 Other nail disorders: Secondary | ICD-10-CM

## 2021-11-20 DIAGNOSIS — M79674 Pain in right toe(s): Secondary | ICD-10-CM

## 2021-11-20 DIAGNOSIS — M7751 Other enthesopathy of right foot: Secondary | ICD-10-CM

## 2021-11-20 DIAGNOSIS — Z9889 Other specified postprocedural states: Secondary | ICD-10-CM

## 2021-11-20 NOTE — Progress Notes (Signed)
Subjective: ?Sheri [name] is a 46 y.o. female patient seen today in office for POV # 4 date of surgery 09/30/2021 status post permanent removal of right hallux pincher nail and subungual exostosis using a rasp.  Patient admits some pain and thinks that it is doing better ready to resume work. ? ?Patient brought in duty paperwork for me to complete on her behalf. ? ?Patient Active Problem List  ? Diagnosis Date Noted  ? Severe obesity (BMI 35.0-39.9) with comorbidity ([place]) 09/11/2021  ? Meralgia paresthetica 03/08/2021  ? Neuropathy 01/02/2021  ? Bilateral sciatica 01/02/2021  ? Upper airway cough syndrome 11/08/2020  ? Cigarette smoker 11/08/2020  ? Type 2 diabetes mellitus ([place]) 10/18/2020  ? Hyperlipidemia 10/18/2020  ? Rectal bleeding 09/27/2020  ? Essential hypertension 09/27/2020  ? 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 Plantar fasciitis 03/03/2019  ? ? ?Current Outpatient Medications on File Prior to Visit  ?Medication Sig Dispense Refill  ? albuterol (VENTOLIN HFA) 108 (90 Base) MCG/ACT inhaler Inhale 2 puffs into the lungs every 6 (six) hours as needed for wheezing or shortness of breath. 8 g 0  ? Alcohol Swabs (ALCOHOL PREP) 70 % PADS One injection weekly. 100 each 1  ? amLODipine (NORVASC) 10 MG tablet Take 1 tablet (10 mg total) by mouth daily. 90 tablet 0  ? atorvastatin (LIPITOR) 40 MG tablet Take 1 tablet (40 mg total) by mouth daily. 90 tablet 1  ? Blood Glucose Monitoring Suppl (TRUE METRIX METER) w/Device KIT Use as directed 1 kit 0  ? buPROPion (WELLBUTRIN SR) 150 MG 12 hr tablet Take 1 tablet (150 mg total) by mouth 2 (two) times daily. 180 tablet 0  ? Continuous Blood Gluc Sensor (DEXCOM G6 SENSOR) MISC Use to check blood sugar 4 (four) times daily -  before meals and at bedtime. 3 each 8  ? Continuous Blood Gluc Sensor (FREESTYLE LIBRE 2 SENSOR) MISC Use as directed to check blood sugar 2 each 0  ? Continuous Blood Gluc Transmit (DEXCOM G6 TRANSMITTER) MISC Use to check blood sugar  4  (four) times daily -  before meals and at bedtime. 1 each 4  ? COVID-19 mRNA bivalent vaccine, Moderna, (MODERNA COVID-19 BIVAL BOOSTER) 50 MCG/0.5ML injection Inject into the muscle. 0.5 mL 0  ? docusate sodium (COLACE) 100 MG capsule Take 1 capsule (100 mg total) by mouth 2 (two) times daily. 100 capsule 0  ? DULoxetine (CYMBALTA) 60 MG capsule Take 1 capsule (60 mg total) by mouth daily. 90 capsule 3  ? famotidine (PEPCID) 20 MG tablet One after supper 30 tablet 11  ? glucose blood (TRUE METRIX BLOOD GLUCOSE TEST) test strip Use as instructed 100 each 12  ? hydrochlorothiazide (HYDRODIURIL) 12.5 MG tablet Take 1 tablet (12.5 mg total) by mouth daily. 90 tablet 0  ? ibuprofen (ADVIL) 800 MG tablet Take 1 tablet (800 mg total) by mouth every 8 (eight) hours as needed. 30 tablet 0  ? influenza vac split quadrivalent PF (FLUARIX) 0.5 ML injection Inject into the muscle. 0.5 mL 0  ? Insulin Pen Needle (BD PEN NEEDLE MICRO U/F) 32G X 6 MM MISC One injection weekly. 100 each 0  ? lamoTRIgine (LAMICTAL) 25 MG tablet Take 1 tablet by mouth daily for 1 week, 1 tab twice daily for 1 week, 1 tablet three times daily for 1 week, then 2 tabs twice daily 120 tablet 5  ? neomycin-bacitracin-polymyxin (NEOSPORIN) ointment Apply 1 application. topically every 12 (twelve) hours. 15 g 0  ?  nicotine (NICODERM CQ - DOSED IN MG/24 HOURS) 14 mg/24hr patch Place 1 patch onto the skin daily. 28 patch 2  ? olmesartan (BENICAR) 40 MG tablet Take 1 tablet (40 mg total) by mouth daily. 90 tablet 0  ? pantoprazole (PROTONIX) 40 MG tablet Take 1 tablet (40 mg total) by mouth daily. 90 tablet 0  ? promethazine (PHENERGAN) 25 MG tablet Take 1 tablet (25 mg total) by mouth every 8 (eight) hours as needed for nausea or vomiting. 20 tablet 0  ? Semaglutide,0.25 or 0.5MG/DOS, 2 MG/1.5ML SOPN Inject 0.25 mg into the skin once a week. 0.75 mL 0  ? traZODone (DESYREL) 100 MG tablet Take 1 tablet (100 mg total) by mouth at bedtime. 30 tablet 1  ?  TRUEplus Lancets 28G MISC Use as directed 100 each 4  ? ?No current facility-administered medications on file prior to visit.  ? ? ?Allergies  ?Allergen Reactions  ? Amoxicillin Hives and Rash  ? Iodine Anaphylaxis and Swelling  ? Penicillins Hives, Itching and Rash  ? Shellfish-Derived Products Anaphylaxis  ? Erythromycin Swelling and Other (See Comments)  ? Metformin Diarrhea  ? ? ?Objective: ?There were no vitals filed for this visit. ? ?General: No acute distress, AAOx3  ?Right foot: To the right hallux nail bed appears to be granularat the proximal nail fold minimal small amount of blood in the nailbed no significant drainage, no malodor no significant redness or warmth.  There is still mild guarding due to pain at right great toe. ? ? ?Assessment and Plan:  ?Problem List Items Addressed This Visit   ? ?  ? Endocrine  ? Type 2 diabetes mellitus ([place])  ? ?Other Visit Diagnoses   ? ? Bone spur of right foot    -  Primary  ? Pincer nail deformity      ? Toe pain, right      ? S/P foot surgery, right      ? ?  ?  ? ?-Patient seen and evaluated ?-Cleansed right great toe and applied [place] and dry dressing ?-Advised patient to continue with patting dry after her shower and every other day applying bacitracin and dressing to the area until it is healed ?-Advised patient to continue with rest ice elevation for pain and edema control ?-Continue with hydrocodone as she has at home as needed for pain ?-Continue with surgical shoe ?-FMLA paperwork completed estimated return to work for 11/20/21 with restrictions of wearing surgical shoe for 1 month until 12/23/2021 or until pain or symptoms are better ?-We will plan for toe check and transition out of surgical shoe at next office visit ? ?Landis Martins, DPM ? ?

## 2021-11-23 NOTE — Progress Notes (Signed)
Erroneous encounter

## 2021-11-29 ENCOUNTER — Encounter: Payer: BC Managed Care – PPO | Admitting: Family

## 2021-11-29 DIAGNOSIS — E1142 Type 2 diabetes mellitus with diabetic polyneuropathy: Secondary | ICD-10-CM

## 2021-12-06 DIAGNOSIS — F32A Depression, unspecified: Secondary | ICD-10-CM | POA: Diagnosis not present

## 2021-12-20 ENCOUNTER — Encounter: Payer: BC Managed Care – PPO | Admitting: Sports Medicine

## 2021-12-24 DIAGNOSIS — E119 Type 2 diabetes mellitus without complications: Secondary | ICD-10-CM | POA: Diagnosis not present

## 2021-12-24 LAB — HM DIABETES EYE EXAM

## 2022-01-02 ENCOUNTER — Encounter: Payer: BC Managed Care – PPO | Admitting: Family Medicine

## 2022-01-10 NOTE — Progress Notes (Signed)
Patient ID: Sheri [name], female    DOB: [birth year]  MRN: [id number]  CC: Chronic Conditions Follow-Up  Subjective: Sheri [name] is a 46 y.o. female who presents for chronic conditions follow-up.   Her concerns today include:  Hypertension follow-up: 10/22/2021: - Continue Amlodipine, Hydrochlorothiazide, and Olmesartan as prescribed.   01/14/2022: Doing well on current regimen. No side effects. No issues/concerns. Denies chest pain, shortness of breath, worst headache of life and additional red flag symptoms. Reports blood pressure outside of office about the same as today's reading.  2. Diabetes type 2 follow-up: 10/22/2021: - Hemoglobin A1c today not at goal at 7.8%, goal < 7%. This is increased from previous 6.6% on 07/23/2021. - Dulaglutide discontinued per patient preference. - Patient intolerant to Metformin, causes diarrhea.  - Begin Semaglutide as prescribed. Counseled on medication adherence and adverse effects.  - Follow-up with primary provider in 4 weeks or sooner if needed.    01/14/2022: Doing well on current regimen, no issues/concerns.   3. Rash: Bilateral palms of hands. Endorses itching and burning sensation. Has not changed any recent lotions, soaps, perfumes, detergents etc.   4. Allergies: Requesting refill of inhaler. Has flares during spring/summer months.   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 100 each 1   atorvastatin (LIPITOR) 40 MG tablet Take 1 tablet (40 mg total) by mouth daily. 90 tablet 1   Blood Glucose Monitoring Suppl (TRUE METRIX METER) w/Device KIT Use as directed 1 kit 0   buPROPion (WELLBUTRIN SR) 150 MG 12 hr tablet Take 1 tablet (150 mg total) by mouth 2 (two) times daily. 180 tablet 0   Continuous Blood Gluc Sensor (FREESTYLE LIBRE 2 SENSOR) MISC Use as directed to check blood sugar 2 each 0   Continuous Blood Gluc Transmit (DEXCOM G6 TRANSMITTER) MISC Use to check blood sugar  4 (four) times daily -  before meals and at bedtime. 1 each 4   COVID-19 mRNA bivalent vaccine, Moderna, (MODERNA COVID-19 BIVAL BOOSTER) 50 MCG/0.5ML injection Inject into the muscle. 0.5 mL 0   docusate sodium (COLACE) 100 MG capsule Take 1 capsule (100 mg total) by mouth 2 (two) times daily. 100 capsule 0   DULoxetine (CYMBALTA) 60 MG capsule Take 1 capsule (60 mg total) by mouth daily. 90 capsule 3   famotidine (PEPCID) 20 MG tablet One after supper 30 tablet 11   glucose blood (TRUE METRIX BLOOD GLUCOSE TEST) test strip Use as instructed 100 each 12   ibuprofen (ADVIL) 800 MG tablet Take 1 tablet (800 mg total) by mouth every 8 (eight) hours as needed. 30 tablet 0   influenza vac split quadrivalent PF (FLUARIX) 0.5 ML injection Inject into the muscle. 0.5 mL 0   Insulin Pen Needle (BD PEN NEEDLE MICRO U/F) 32G X 6 MM MISC One injection weekly. 100 each 0   lamoTRIgine (LAMICTAL) 25 MG tablet Take 1 tablet by  mouth daily for 1 week, 1 tab twice daily for 1 week, 1 tablet three times daily for 1 week, then 2 tabs twice daily 120 tablet 5   neomycin-bacitracin-polymyxin (NEOSPORIN) ointment Apply 1 application. topically every 12 (twelve) hours. 15 g 0   nicotine (NICODERM CQ - DOSED IN MG/24 HOURS) 14 mg/24hr patch Place 1 patch onto the skin daily. 28 patch 2   pantoprazole (PROTONIX) 40 MG tablet Take 1 tablet (40 mg total) by mouth daily.  90 tablet 0   promethazine (PHENERGAN) 25 MG tablet Take 1 tablet (25 mg total) by mouth every 8 (eight) hours as needed for nausea or vomiting. 20 tablet 0   traZODone (DESYREL) 100 MG tablet Take 1 tablet (100 mg total) by mouth at bedtime. 30 tablet 1   TRUEplus Lancets 28G MISC Use as directed 100 each 4   No current facility-administered medications on file prior to visit.    Allergies  Allergen Reactions   Amoxicillin Hives and Rash   Iodine Anaphylaxis and Swelling   Penicillins Hives, Itching and Rash   Shellfish-Derived Products Anaphylaxis   Erythromycin Swelling and Other (See Comments)   Metformin Diarrhea    Social History   Socioeconomic History   Marital status: Single    Spouse name: Not on file   Number of children: Not on file   Years of education: Not on file   Highest education level: High school graduate  Occupational History   Not on file  Tobacco Use   Smoking status: Some Days    Packs/day: 0.50    Years: 22.00    Pack years: 11.00    Types: Cigarettes    Passive exposure: Current   Smokeless tobacco: Never   Tobacco comments:    4-5 cigs per day 11/08/20//lmr  Vaping Use   Vaping Use: Some days  Substance and Sexual Activity   Alcohol use: Yes    Comment: occasional   Drug use: Not Currently   Sexual activity: Never    Birth control/protection: None  Other Topics Concern   Not on file  Social History Narrative   ** Merged History Encounter **    Lives alone   Right handed   Caffeine: coffee every morning and tea.    Social Determinants of Health   Financial Resource Strain: Not on file  Food Insecurity: Not on file  Transportation Needs: Not on file  Physical Activity: Not on file  Stress: Not on file  Social Connections: Not on file  Intimate Partner Violence: Not on file    Family History  Problem Relation Age of Onset   Hypertension Mother    Diabetes Mother    Diabetes Father    Hypertension Father    Colon cancer  Maternal Grandfather    Diabetes Paternal Grandmother    Rectal cancer Neg Hx    Stomach cancer Neg Hx    Esophageal cancer Neg Hx     Past Surgical History:  Procedure Laterality Date   BASAL CELL CARCINOMA EXCISION     ed records 07/26/2020   CERVICAL DISC SURGERY      ROS: Review of Systems Negative except as stated above  PHYSICAL EXAM: BP (!) 153/78 (BP Location: Left Arm, Patient Position: Sitting, Cuff Size: Large)   Pulse 85   Temp 98.3 F (36.8 C)   Resp 18   Ht 5' 7.99" (1.727 m)   Wt 257 lb (116.6 kg)   SpO2 96%   BMI 39.09 kg/m  Physical Exam HENT:     Head: Normocephalic and atraumatic.  Eyes:     Extraocular Movements: Extraocular movements intact.     Conjunctiva/sclera: Conjunctivae normal.     Pupils: Pupils are equal, round, and reactive to light.  Cardiovascular:     Rate and Rhythm: Normal rate and regular rhythm.     Pulses: Normal pulses.     Heart sounds: Normal heart sounds.  Pulmonary:     Effort: Pulmonary effort is normal.     Breath sounds: Normal breath sounds.  Musculoskeletal:     Cervical back: Normal range of motion and neck supple.  Neurological:     General: No focal deficit present.     Mental Status: She is alert and oriented to person, place, and time.  Psychiatric:        Mood and Affect: Mood normal.        Behavior: Behavior normal.    ASSESSMENT AND PLAN: 1. Essential (primary) hypertension - Blood pressure not at goal during today's visit. Patient asymptomatic without chest pressure, chest pain, palpitations, shortness of breath, worst headache of life, and any additional red flag symptoms. - Continue Amlodipine and Olmesartan as prescribed. - Increase Hydrochlorothiazide from 12.5 mg daily to 25 mg daily.  - Counseled on blood pressure goal of less than 130/80, low-sodium, DASH diet, medication compliance, and 150 minutes of moderate intensity exercise per week as tolerated. Counseled on medication adherence and  adverse effects. - Update BMP.  - Follow-up with primary provider in 2 weeks or sooner if needed.  - Basic Metabolic Panel - amLODipine (NORVASC) 10 MG tablet; Take 1 tablet (10 mg total) by mouth daily.  Dispense: 90 tablet; Refill: 0 - hydrochlorothiazide (HYDRODIURIL) 25 MG tablet; Take 1 tablet (25 mg total) by mouth daily.  Dispense: 90 tablet; Refill: 0 - olmesartan (BENICAR) 40 MG tablet; Take 1 tablet (40 mg total) by mouth daily.  Dispense: 90 tablet; Refill: 0  2. Type 2 diabetes mellitus with diabetic polyneuropathy, without long-term current use of insulin (HCC) - Hemoglobin A1c result pending. Will update Semaglutide regimen once results.  - Hemoglobin A1c  3. Rash and nonspecific skin eruption - Triamcinolone cream as prescribed. Counseled on medication adherence and adverse effects.  - Follow-up with primary provider as scheduled.  - triamcinolone cream (KENALOG) 0.1 %; Apply 1 application. topically 2 (two) times daily.  Dispense: 30 g; Refill: 0  4. Perennial allergic rhinitis 5. History of bronchitis - Albuterol inhaler as prescribed.  - Follow-up with primary provider as scheduled.  - albuterol (VENTOLIN HFA) 108 (90 Base) MCG/ACT inhaler; Inhale 2 puffs into the lungs every 6 (six) hours as needed for wheezing or shortness of breath.  Dispense: 8 g; Refill: 1    Patient was given the opportunity to ask questions.  Patient verbalized understanding of the plan and was able to repeat key elements of the plan. Patient was given clear instructions to go to Emergency Department or return to medical center if symptoms don't improve, worsen, or new problems develop.The patient verbalized understanding.   Orders Placed This Encounter  Procedures   Basic Metabolic Panel   Hemoglobin A1c     Requested Prescriptions   Signed Prescriptions Disp Refills   triamcinolone cream (KENALOG) 0.1 % 30 g 0    Sig: Apply 1 application. topically 2 (two) times daily.   amLODipine  (NORVASC) 10 MG tablet 90 tablet 0    Sig: Take 1 tablet (10 mg total) by mouth daily.  hydrochlorothiazide (HYDRODIURIL) 25 MG tablet 90 tablet 0    Sig: Take 1 tablet (25 mg total) by mouth daily.   olmesartan (BENICAR) 40 MG tablet 90 tablet 0    Sig: Take 1 tablet (40 mg total) by mouth daily.   albuterol (VENTOLIN HFA) 108 (90 Base) MCG/ACT inhaler 8 g 1    Sig: Inhale 2 puffs into the lungs every 6 (six) hours as needed for wheezing or shortness of breath.    Return in about 2 weeks (around 01/28/2022) for Follow-Up or next available HTN.  Camillia Herter, NP

## 2022-01-14 ENCOUNTER — Ambulatory Visit (INDEPENDENT_AMBULATORY_CARE_PROVIDER_SITE_OTHER): Payer: BC Managed Care – PPO | Admitting: Family

## 2022-01-14 ENCOUNTER — Encounter: Payer: Self-pay | Admitting: Family

## 2022-01-14 VITALS — BP 153/78 | HR 85 | Temp 98.3°F | Resp 18 | Ht 67.99 in | Wt 257.0 lb

## 2022-01-14 DIAGNOSIS — J3089 Other allergic rhinitis: Secondary | ICD-10-CM

## 2022-01-14 DIAGNOSIS — I10 Essential (primary) hypertension: Secondary | ICD-10-CM

## 2022-01-14 DIAGNOSIS — E1142 Type 2 diabetes mellitus with diabetic polyneuropathy: Secondary | ICD-10-CM

## 2022-01-14 DIAGNOSIS — Z8709 Personal history of other diseases of the respiratory system: Secondary | ICD-10-CM

## 2022-01-14 DIAGNOSIS — R21 Rash and other nonspecific skin eruption: Secondary | ICD-10-CM | POA: Diagnosis not present

## 2022-01-14 MED ORDER — HYDROCHLOROTHIAZIDE 25 MG PO TABS: 25.0000 mg | ORAL_TABLET | Freq: Every day | ORAL | 0 refills | Status: DC

## 2022-01-14 MED ORDER — OLMESARTAN MEDOXOMIL 40 MG PO TABS: 40.0000 mg | ORAL_TABLET | Freq: Every day | ORAL | 0 refills | Status: DC

## 2022-01-14 MED ORDER — AMLODIPINE BESYLATE 10 MG PO TABS: 10.0000 mg | ORAL_TABLET | Freq: Every day | ORAL | 0 refills | Status: DC

## 2022-01-14 MED ORDER — ALBUTEROL SULFATE HFA 108 (90 BASE) MCG/ACT IN AERS: 2.0000 | INHALATION_SPRAY | Freq: Four times a day (QID) | RESPIRATORY_TRACT | 1 refills | Status: AC | PRN

## 2022-01-14 MED ORDER — TRIAMCINOLONE ACETONIDE 0.1 % EX CREA: 1.0000 "application " | TOPICAL_CREAM | Freq: Two times a day (BID) | CUTANEOUS | 0 refills | Status: DC

## 2022-01-14 NOTE — Progress Notes (Signed)
Pt presents for chronic care mgmt, complains of dry, itchy, and burning sensation on palms of hands  Needs refill on Amlodipine and Ozempic

## 2022-01-15 ENCOUNTER — Ambulatory Visit: Payer: BC Managed Care – PPO | Admitting: Family

## 2022-01-15 ENCOUNTER — Other Ambulatory Visit: Payer: Self-pay | Admitting: Family

## 2022-01-15 DIAGNOSIS — E1142 Type 2 diabetes mellitus with diabetic polyneuropathy: Secondary | ICD-10-CM

## 2022-01-15 LAB — BASIC METABOLIC PANEL
BUN/Creatinine Ratio: 17 (ref 9–23)
BUN: 12 mg/dL (ref 6–24)
CO2: 23 mmol/L (ref 20–29)
Calcium: 9.2 mg/dL (ref 8.7–10.2)
Chloride: 103 mmol/L (ref 96–106)
Creatinine, Ser: 0.72 mg/dL (ref 0.57–1.00)
Glucose: 127 mg/dL — ABNORMAL HIGH (ref 70–99)
Potassium: 4.1 mmol/L (ref 3.5–5.2)
Sodium: 140 mmol/L (ref 134–144)
eGFR: 104 mL/min/{1.73_m2} (ref >59)

## 2022-01-15 LAB — HEMOGLOBIN A1C
Est. average glucose Bld gHb Est-mCnc: 157 mg/dL
Hgb A1c MFr Bld: 7.1 % — ABNORMAL HIGH (ref 4.8–5.6)

## 2022-01-15 MED ORDER — SEMAGLUTIDE(0.25 OR 0.5MG/DOS) 2 MG/1.5ML ~~LOC~~ SOPN: 0.2500 mg | PEN_INJECTOR | SUBCUTANEOUS | 0 refills | Status: DC

## 2022-01-16 ENCOUNTER — Encounter: Payer: Self-pay | Admitting: Family

## 2022-01-22 NOTE — Progress Notes (Signed)
Patient ID: Sheri [name], female    DOB: 1976/07/27  MRN: [id number]  CC: Hypertension Follow-Up  Subjective: Sheri [name] is a 46 y.o. female who presents for hypertension follow-up.   Her concerns today include:  Hypertension follow-up: 01/14/2022: - Continue Amlodipine and Olmesartan as prescribed. - Increase Hydrochlorothiazide from 12.5 mg daily to 25 mg daily.   01/30/2022: Doing well on current regimen. [name] side effects. [name] issues/concerns. Blood pressures outside of office similar to today's result.   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 3 mL 0   albuterol (VENTOLIN HFA) 108 (90 Base) MCG/ACT inhaler Inhale 2 puffs into the lungs every 6 (six) hours as needed for wheezing or shortness of breath. 8 g 1   Alcohol Swabs (ALCOHOL PREP) 70 % PADS One injection weekly. 100 each 1   amLODipine (NORVASC) 10 MG tablet Take 1 tablet (10 mg total) by mouth daily. 90 tablet 0   atorvastatin (LIPITOR) 40 MG tablet Take 1 tablet (40 mg total) by mouth daily. 90 tablet 1   Blood Glucose Monitoring Suppl (TRUE METRIX METER) w/Device KIT Use as directed 1 kit 0   buPROPion (WELLBUTRIN SR) 150 MG 12 hr tablet Take 1 tablet (150 mg total) by mouth 2 (two) times daily. 180 tablet 0   Continuous Blood Gluc Transmit (DEXCOM G6 TRANSMITTER) MISC Use to check  blood sugar  4 (four) times daily -  before meals and at bedtime. 1 each 4   COVID-19 mRNA bivalent vaccine, Moderna, (MODERNA COVID-19 BIVAL BOOSTER) 50 MCG/0.5ML injection Inject into the muscle. 0.5 mL 0   DULoxetine (CYMBALTA) 60 MG capsule Take 1 capsule (60 mg total) by mouth daily. 90 capsule 3   famotidine (PEPCID) 20 MG tablet One after supper 30 tablet 11   glucose blood (TRUE METRIX BLOOD GLUCOSE TEST) test strip Use as instructed 100 each 12   hydrochlorothiazide (HYDRODIURIL) 25 MG tablet Take 1 tablet (25 mg total) by mouth daily. 90 tablet 0   influenza vac split quadrivalent PF (FLUARIX) 0.5 ML injection Inject into the muscle. 0.5 mL 0   Insulin Pen Needle (BD PEN NEEDLE MICRO U/F) 32G X 6 MM MISC One injection weekly. 100 each 0   lamoTRIgine (LAMICTAL) 25 MG tablet Take 1 tablet by mouth daily for 1 week, 1 tab twice daily for 1 week, 1 tablet three times daily for 1 week, then 2 tabs twice daily 120 tablet 5   nicotine (NICODERM CQ - DOSED IN MG/24 HOURS) 14 mg/24hr patch Place 1 patch onto the skin daily. 28 patch 2   olmesartan (BENICAR) 40 MG tablet Take 1 tablet (40 mg total) by mouth daily. 90 tablet 0   pantoprazole (PROTONIX) 40 MG tablet Take 1 tablet (40 mg total) by mouth daily. 90 tablet 0   traZODone (DESYREL) 100 MG tablet Take 1 tablet (100 mg total)  by mouth at bedtime. 30 tablet 1   triamcinolone cream (KENALOG) 0.1 % Apply 1 application. topically 2 (two) times daily. 30 g 0   TRUEplus Lancets 28G MISC Use as directed 100 each 4   [name] current facility-administered medications on file prior to visit.    Allergies  Allergen Reactions   Amoxicillin Hives and Rash   Iodine Anaphylaxis and Swelling   Penicillins Hives, Itching and Rash   Shellfish-Derived Products Anaphylaxis   Erythromycin Swelling and Other (See Comments)   Metformin Diarrhea    Social History   Socioeconomic History   Marital status: Single    Spouse name: Not on file   Number of  children: Not on file   Years of education: Not on file   Highest education level: High school graduate  Occupational History   Not on file  Tobacco Use   Smoking status: Some Days    Packs/day: 0.50    Years: 22.00    Total pack years: 11.00    Types: Cigarettes    Passive exposure: Current   Smokeless tobacco: Never   Tobacco comments:    4-5 cigs per day 11/08/20//lmr  Vaping Use   Vaping Use: Some days  Substance and Sexual Activity   Alcohol use: Yes    Comment: occasional   Drug use: Not Currently   Sexual activity: Never    Birth control/protection: None  Other Topics Concern   Not on file  Social History Narrative   ** Merged History Encounter **    Lives alone   Right handed   Caffeine: coffee every morning and tea.    Social Determinants of Health   Financial Resource Strain: Not on file  Food Insecurity: Not on file  Transportation Needs: Not on file  Physical Activity: Not on file  Stress: Not on file  Social Connections: Not on file  Intimate Partner Violence: Not on file    Family History  Problem Relation Age of Onset   Hypertension Mother    Diabetes Mother    Diabetes Father    Hypertension Father    Colon cancer Maternal Grandfather    Diabetes Paternal Grandmother    Rectal cancer Neg Hx    Stomach cancer Neg Hx    Esophageal cancer Neg Hx     Past Surgical History:  Procedure Laterality Date   BASAL CELL CARCINOMA EXCISION     ed records 07/26/2020   CERVICAL DISC SURGERY      ROS: Review of Systems Negative except as stated above  PHYSICAL EXAM: BP (!) 145/82 (BP Location: Left Arm, Patient Position: Sitting, Cuff Size: Large)   Pulse 88   Temp 98.5 F (36.9 C)   Resp 16   Ht 5' 7.99" (1.727 m)   Wt 260 lb (117.9 kg)   SpO2 97%   BMI 39.54 kg/m   Physical Exam HENT:     Head: Normocephalic and atraumatic.  Eyes:     Extraocular Movements: Extraocular movements intact.     Conjunctiva/sclera: Conjunctivae normal.      Pupils: Pupils are equal, round, and reactive to light.  Cardiovascular:     Rate and Rhythm: Normal rate and regular rhythm.     Pulses: Normal pulses.     Heart sounds: Normal heart sounds.  Pulmonary:     Effort: Pulmonary effort is normal.     Breath sounds: Normal breath sounds.  Musculoskeletal:     Cervical back: Normal range of motion and neck supple.  Neurological:     General: [name] focal deficit present.     Mental Status: She is alert and oriented to person, place, and time.  Psychiatric:        Mood and Affect: Mood normal.        Behavior: Behavior normal.     ASSESSMENT AND PLAN: 1. Essential (primary) hypertension - Blood pressure not at goal during today's visit. Patient asymptomatic without chest pressure, chest pain, palpitations, shortness of breath, worst headache of life, and any additional red flag symptoms. - Continue Amlodipine and Olmesartan as prescribed. [name] refills needed as of present.  - Increase Hydrochlorothiazide from 25 mg daily to 50 mg daily. Instructed to take two 25 mg tablets to equal new dosage.  - Counseled on blood pressure goal of less than 130/80, low-sodium, DASH diet, medication compliance, and 150 minutes of moderate intensity exercise per week as tolerated. Counseled on medication adherence and adverse effects. - Update BMP.  - Follow-up with primary provider in 2 weeks or sooner if needed.  - Basic Metabolic Panel    Patient was given the opportunity to ask questions.  Patient verbalized understanding of the plan and was able to repeat key elements of the plan. Patient was given clear instructions to go to Emergency Department or return to medical center if symptoms don't improve, worsen, or new problems develop.The patient verbalized understanding.   Orders Placed This Encounter  Procedures   Basic Metabolic Panel   Return in about 2 weeks (around 02/13/2022) for Follow-Up or next available HTN .  Camillia Herter, NP

## 2022-01-28 ENCOUNTER — Other Ambulatory Visit (HOSPITAL_COMMUNITY): Payer: Self-pay

## 2022-01-30 ENCOUNTER — Ambulatory Visit: Payer: BC Managed Care – PPO | Admitting: Family

## 2022-01-30 ENCOUNTER — Encounter: Payer: Self-pay | Admitting: Family

## 2022-01-30 VITALS — BP 145/82 | HR 88 | Temp 98.5°F | Resp 16 | Ht 67.99 in | Wt 260.0 lb

## 2022-01-30 DIAGNOSIS — I10 Essential (primary) hypertension: Secondary | ICD-10-CM | POA: Diagnosis not present

## 2022-01-30 NOTE — Progress Notes (Signed)
.  Pt presents for hypertension f/u   

## 2022-01-31 LAB — BASIC METABOLIC PANEL
BUN/Creatinine Ratio: 11 (ref 9–23)
BUN: 9 mg/dL (ref 6–24)
CO2: 24 mmol/L (ref 20–29)
Calcium: 9.4 mg/dL (ref 8.7–10.2)
Chloride: 105 mmol/L (ref 96–106)
Creatinine, Ser: 0.8 mg/dL (ref 0.57–1.00)
Glucose: 134 mg/dL — ABNORMAL HIGH (ref 70–99)
Potassium: 4 mmol/L (ref 3.5–5.2)
Sodium: 144 mmol/L (ref 134–144)
eGFR: 92 mL/min/{1.73_m2} (ref >59)

## 2022-02-05 NOTE — Progress Notes (Deleted)
Patient ID: Sheri [name], female    DOB: [birth year]  MRN: [id number]  CC: Blood Pressure Check   Subjective: Sheri [name] is a 46 y.o. female who presents for blood pressure check.   Her concerns today include:  - Continue Amlodipine and Olmesartan as prescribed. No refills needed as of present.  - Increase Hydrochlorothiazide from 25 mg daily to 50 mg daily. Instructed to take two 25 mg tablets to equal new dosage.   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 3 mL 0   albuterol (VENTOLIN HFA) 108 (90 Base) MCG/ACT inhaler Inhale 2 puffs into the lungs every 6 (six) hours as needed for wheezing or shortness of breath. 8 g 1   Alcohol Swabs (ALCOHOL PREP) 70 % PADS One injection weekly. 100 each 1   amLODipine (NORVASC) 10 MG tablet Take 1 tablet (10 mg total) by mouth daily. 90 tablet 0   atorvastatin (LIPITOR) 40 MG tablet Take 1 tablet (40 mg total) by mouth daily. 90 tablet 1   Blood Glucose Monitoring Suppl (TRUE METRIX METER) w/Device KIT Use as directed 1 kit 0   buPROPion (WELLBUTRIN SR) 150 MG 12 hr tablet Take 1 tablet (150 mg total) by mouth 2 (two) times daily. 180 tablet 0   Continuous Blood Gluc Transmit (DEXCOM G6 TRANSMITTER) MISC Use to check blood sugar  4 (four) times daily -  before meals and at bedtime. 1 each 4   COVID-19 mRNA  bivalent vaccine, Moderna, (MODERNA COVID-19 BIVAL BOOSTER) 50 MCG/0.5ML injection Inject into the muscle. 0.5 mL 0   DULoxetine (CYMBALTA) 60 MG capsule Take 1 capsule (60 mg total) by mouth daily. 90 capsule 3   famotidine (PEPCID) 20 MG tablet One after supper 30 tablet 11   glucose blood (TRUE METRIX BLOOD GLUCOSE TEST) test strip Use as instructed 100 each 12   hydrochlorothiazide (HYDRODIURIL) 25 MG tablet Take 1 tablet (25 mg total) by mouth daily. 90 tablet 0   influenza vac split quadrivalent PF (FLUARIX) 0.5 ML injection Inject into the muscle. 0.5 mL 0   Insulin Pen Needle (BD PEN NEEDLE MICRO U/F) 32G X 6 MM MISC One injection weekly. 100 each 0   lamoTRIgine (LAMICTAL) 25 MG tablet Take 1 tablet by mouth daily for 1 week, 1 tab twice daily for 1 week, 1 tablet three times daily for 1 week, then 2 tabs twice daily 120 tablet 5   nicotine (NICODERM CQ - DOSED IN MG/24 HOURS) 14 mg/24hr patch Place 1 patch onto the skin daily. 28 patch 2   olmesartan (BENICAR) 40 MG tablet Take 1 tablet (40 mg total) by mouth daily. 90 tablet 0   pantoprazole (PROTONIX) 40 MG tablet Take 1 tablet (40 mg total) by mouth daily. 90 tablet 0   traZODone (DESYREL) 100 MG tablet Take 1 tablet (100 mg total) by mouth at bedtime.  30 tablet 1   triamcinolone cream (KENALOG) 0.1 % Apply 1 application. topically 2 (two) times daily. 30 g 0   TRUEplus Lancets 28G MISC Use as directed 100 each 4   No current facility-administered medications on file prior to visit.    Allergies  Allergen Reactions   Amoxicillin Hives and Rash   Iodine Anaphylaxis and Swelling   Penicillins Hives, Itching and Rash   Shellfish-Derived Products Anaphylaxis   Erythromycin Swelling and Other (See Comments)   Metformin Diarrhea    Social History   Socioeconomic History   Marital status: Single    Spouse name: Not on file   Number of children: Not on file   Years of education: Not on file   Highest education level: High  school graduate  Occupational History   Not on file  Tobacco Use   Smoking status: Some Days    Packs/day: 0.50    Years: 22.00    Total pack years: 11.00    Types: Cigarettes    Passive exposure: Current   Smokeless tobacco: Never   Tobacco comments:    4-5 cigs per day 11/08/20//lmr  Vaping Use   Vaping Use: Some days  Substance and Sexual Activity   Alcohol use: Yes    Comment: occasional   Drug use: Not Currently   Sexual activity: Never    Birth control/protection: None  Other Topics Concern   Not on file  Social History Narrative   ** Merged History Encounter **    Lives alone   Right handed   Caffeine: coffee every morning and tea.    Social Determinants of Health   Financial Resource Strain: Not on file  Food Insecurity: Not on file  Transportation Needs: Not on file  Physical Activity: Not on file  Stress: Not on file  Social Connections: Not on file  Intimate Partner Violence: Not on file    Family History  Problem Relation Age of Onset   Hypertension Mother    Diabetes Mother    Diabetes Father    Hypertension Father    Colon cancer Maternal Grandfather    Diabetes Paternal Grandmother    Rectal cancer Neg Hx    Stomach cancer Neg Hx    Esophageal cancer Neg Hx     Past Surgical History:  Procedure Laterality Date   BASAL CELL CARCINOMA EXCISION     ed records 07/26/2020   CERVICAL DISC SURGERY      ROS: Review of Systems Negative except as stated above  PHYSICAL EXAM: There were no vitals taken for this visit.  Physical Exam  {female adult master:310786} {female adult master:310785}     Latest Ref Rng & Units 01/30/2022    8:48 AM 01/14/2022    1:01 PM 07/23/2021    4:08 PM  CMP  Glucose 70 - 99 mg/dL 134  127  128   BUN 6 - 24 mg/dL 9  12  12   Creatinine 0.57 - 1.00 mg/dL 0.80  0.72  1.05   Sodium 134 - 144 mmol/L 144  140  145   Potassium 3.5 - 5.2 mmol/L 4.0  4.1  3.9   Chloride 96 - 106 mmol/L 105  103  104   CO2 20 - 29  mmol/L 24  23  24   Calcium 8.7 - 10.2 mg/dL 9.4  9.2  9.9    Lipid Panel     Component Value Date/Time   CHOL 217 (H) 10/22/2021 0929   TRIG   220 (H) 10/22/2021 0929   HDL 44 10/22/2021 0929   CHOLHDL 4.9 (H) 10/22/2021 0929   LDLCALC 134 (H) 10/22/2021 0929    CBC    Component Value Date/Time   WBC 11.8 (H) 10/17/2020 1151   RBC 5.08 10/17/2020 1151   HGB 14.7 10/17/2020 1151   HCT 43.8 10/17/2020 1151   PLT 304 10/17/2020 1151   MCV 86 10/17/2020 1151   MCH 28.9 10/17/2020 1151   MCHC 33.6 10/17/2020 1151   RDW 13.1 10/17/2020 1151    ASSESSMENT AND PLAN:  There are no diagnoses linked to this encounter.   Patient was given the opportunity to ask questions.  Patient verbalized understanding of the plan and was able to repeat key elements of the plan. Patient was given clear instructions to go to Emergency Department or return to medical center if symptoms don't improve, worsen, or new problems develop.The patient verbalized understanding.   No orders of the defined types were placed in this encounter.    Requested Prescriptions    No prescriptions requested or ordered in this encounter    No follow-ups on file.  Amy J Stephens, NP  

## 2022-02-13 ENCOUNTER — Ambulatory Visit: Payer: BC Managed Care – PPO | Admitting: Family

## 2022-02-13 VITALS — BP 119/77 | HR 92 | Temp 98.3°F | Resp 16 | Ht 67.99 in | Wt 258.0 lb

## 2022-02-13 DIAGNOSIS — I10 Essential (primary) hypertension: Secondary | ICD-10-CM | POA: Diagnosis not present

## 2022-02-13 NOTE — Progress Notes (Deleted)
  Patient ID: Sheri [name], female    DOB: 01/28/1976  MRN: [id number]  CC: Blood Pressure Check   Subjective: Sheri [name] is a 46 y.o. female who presents for blood pressure check.   Her concerns today include:  - Continue Amlodipine and Olmesartan as prescribed. No refills needed as of present.  - Increase Hydrochlorothiazide from 25 mg daily to 50 mg daily. Instructed to take two 25 mg tablets to equal new dosage.   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 3 mL 0   albuterol (VENTOLIN HFA) 108 (90 Base) MCG/ACT inhaler Inhale 2 puffs into the lungs every 6 (six) hours as needed for wheezing or shortness of breath. 8 g 1   Alcohol Swabs (ALCOHOL PREP) 70 % PADS One injection weekly. 100 each 1   amLODipine (NORVASC) 10 MG tablet Take 1 tablet (10 mg total) by mouth daily. 90 tablet 0   atorvastatin (LIPITOR) 40 MG tablet Take 1 tablet (40 mg total) by mouth daily. 90 tablet 1   Blood Glucose Monitoring Suppl (TRUE METRIX METER) w/Device KIT Use as directed 1 kit 0   buPROPion (WELLBUTRIN SR) 150 MG 12 hr tablet Take 1 tablet (150 mg total) by mouth 2 (two) times daily. 180 tablet 0   Continuous Blood Gluc Transmit (DEXCOM G6 TRANSMITTER) MISC Use to check blood sugar  4 (four) times daily -  before meals and at bedtime. 1 each 4   COVID-19 mRNA  bivalent vaccine, Moderna, (MODERNA COVID-19 BIVAL BOOSTER) 50 MCG/0.5ML injection Inject into the muscle. 0.5 mL 0   DULoxetine (CYMBALTA) 60 MG capsule Take 1 capsule (60 mg total) by mouth daily. 90 capsule 3   famotidine (PEPCID) 20 MG tablet One after supper 30 tablet 11   glucose blood (TRUE METRIX BLOOD GLUCOSE TEST) test strip Use as instructed 100 each 12   hydrochlorothiazide (HYDRODIURIL) 25 MG tablet Take 1 tablet (25 mg total) by mouth daily. 90 tablet 0   influenza vac split quadrivalent PF (FLUARIX) 0.5 ML injection Inject into the muscle. 0.5 mL 0   Insulin Pen Needle (BD PEN NEEDLE MICRO U/F) 32G X 6 MM MISC One injection weekly. 100 each 0   lamoTRIgine (LAMICTAL) 25 MG tablet Take 1 tablet by mouth daily for 1 week, 1 tab twice daily for 1 week, 1 tablet three times daily for 1 week, then 2 tabs twice daily 120 tablet 5   nicotine (NICODERM CQ - DOSED IN MG/24 HOURS) 14 mg/24hr patch Place 1 patch onto the skin daily. 28 patch 2   olmesartan (BENICAR) 40 MG tablet Take 1 tablet (40 mg total) by mouth daily. 90 tablet 0   pantoprazole (PROTONIX) 40 MG tablet Take 1 tablet (40 mg total) by mouth daily. 90 tablet 0   traZODone (DESYREL) 100 MG tablet Take 1 tablet (100 mg total) by mouth at bedtime.   30 tablet 1   triamcinolone cream (KENALOG) 0.1 % Apply 1 application. topically 2 (two) times daily. 30 g 0   TRUEplus Lancets 28G MISC Use as directed 100 each 4   No current facility-administered medications on file prior to visit.    Allergies  Allergen Reactions   Amoxicillin Hives and Rash   Iodine Anaphylaxis and Swelling   Penicillins Hives, Itching and Rash   Shellfish-Derived Products Anaphylaxis   Erythromycin Swelling and Other (See Comments)   Metformin Diarrhea    Social History   Socioeconomic History   Marital status: Single    Spouse name: Not on file   Number of children: Not on file   Years of education: Not on file   Highest education level: High  school graduate  Occupational History   Not on file  Tobacco Use   Smoking status: Some Days    Packs/day: 0.50    Years: 22.00    Total pack years: 11.00    Types: Cigarettes    Passive exposure: Current   Smokeless tobacco: Never   Tobacco comments:    4-5 cigs per day 11/08/20//lmr  Vaping Use   Vaping Use: Some days  Substance and Sexual Activity   Alcohol use: Yes    Comment: occasional   Drug use: Not Currently   Sexual activity: Never    Birth control/protection: None  Other Topics Concern   Not on file  Social History Narrative   ** Merged History Encounter **    Lives alone   Right handed   Caffeine: coffee every morning and tea.    Social Determinants of Health   Financial Resource Strain: Not on file  Food Insecurity: Not on file  Transportation Needs: Not on file  Physical Activity: Not on file  Stress: Not on file  Social Connections: Not on file  Intimate Partner Violence: Not on file    Family History  Problem Relation Age of Onset   Hypertension Mother    Diabetes Mother    Diabetes Father    Hypertension Father    Colon cancer Maternal Grandfather    Diabetes Paternal Grandmother    Rectal cancer Neg Hx    Stomach cancer Neg Hx    Esophageal cancer Neg Hx     Past Surgical History:  Procedure Laterality Date   BASAL CELL CARCINOMA EXCISION     ed records 07/26/2020   CERVICAL DISC SURGERY      ROS: Review of Systems Negative except as stated above  PHYSICAL EXAM: There were no vitals taken for this visit.  Physical Exam  {female adult master:310786} {female adult master:310785}     Latest Ref Rng & Units 01/30/2022    8:48 AM 01/14/2022    1:01 PM 07/23/2021    4:08 PM  CMP  Glucose 70 - 99 mg/dL 134  127  128   BUN 6 - 24 mg/dL 9  12  12   Creatinine 0.57 - 1.00 mg/dL 0.80  0.72  1.05   Sodium 134 - 144 mmol/L 144  140  145   Potassium 3.5 - 5.2 mmol/L 4.0  4.1  3.9   Chloride 96 - 106 mmol/L 105  103  104   CO2 20 - 29  mmol/L 24  23  24   Calcium 8.7 - 10.2 mg/dL 9.4  9.2  9.9    Lipid Panel     Component Value Date/Time   CHOL 217 (H) 10/22/2021 0929   TRIG   220 (H) 10/22/2021 0929   HDL 44 10/22/2021 0929   CHOLHDL 4.9 (H) 10/22/2021 0929   LDLCALC 134 (H) 10/22/2021 0929    CBC    Component Value Date/Time   WBC 11.8 (H) 10/17/2020 1151   RBC 5.08 10/17/2020 1151   HGB 14.7 10/17/2020 1151   HCT 43.8 10/17/2020 1151   PLT 304 10/17/2020 1151   MCV 86 10/17/2020 1151   MCH 28.9 10/17/2020 1151   MCHC 33.6 10/17/2020 1151   RDW 13.1 10/17/2020 1151    ASSESSMENT AND PLAN:  There are no diagnoses linked to this encounter.   Patient was given the opportunity to ask questions.  Patient verbalized understanding of the plan and was able to repeat key elements of the plan. Patient was given clear instructions to go to Emergency Department or return to medical center if symptoms don't improve, worsen, or new problems develop.The patient verbalized understanding.   No orders of the defined types were placed in this encounter.    Requested Prescriptions    No prescriptions requested or ordered in this encounter    No follow-ups on file.  Tryton Bodi J Aylanie Cubillos, NP  

## 2022-02-13 NOTE — Progress Notes (Signed)
.  Pt presents for hypertension f/u   

## 2022-02-13 NOTE — Progress Notes (Signed)
Patient ID: Sheri [name], female    DOB: 1976/05/12  MRN: [id number]  CC: Hypertension   Subjective: Sheri [name] is a 46 y.o. female who presents for hypertension.   Her concerns today include:  Doing well on current blood pressure medications. Did have 90's/80's x 1 day and increased sleepiness. No occurrences since then. No further issues or concerns.   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 3 mL 0   albuterol (VENTOLIN HFA) 108 (90 Base) MCG/ACT inhaler Inhale 2 puffs into the lungs every 6 (six) hours as needed for wheezing or shortness of breath. 8 g 1   Alcohol Swabs (ALCOHOL PREP) 70 % PADS One injection weekly. 100 each 1   amLODipine (NORVASC) 10 MG tablet Take 1 tablet (10 mg total) by mouth daily. 90 tablet 0   atorvastatin (LIPITOR) 40 MG tablet Take 1 tablet (40 mg total) by mouth daily. 90 tablet 1   Blood Glucose Monitoring Suppl (TRUE METRIX METER) w/Device KIT Use as directed 1 kit 0   buPROPion (WELLBUTRIN SR) 150 MG 12 hr tablet Take 1 tablet (150 mg total) by mouth 2 (two) times daily. 180 tablet 0   Continuous Blood Gluc Transmit (DEXCOM G6 TRANSMITTER) MISC Use to check blood sugar  4 (four) times daily -  before meals and at bedtime. 1 each 4   COVID-19 mRNA bivalent vaccine, Moderna, (MODERNA COVID-19 BIVAL BOOSTER) 50  MCG/0.5ML injection Inject into the muscle. 0.5 mL 0   DULoxetine (CYMBALTA) 60 MG capsule Take 1 capsule (60 mg total) by mouth daily. 90 capsule 3   famotidine (PEPCID) 20 MG tablet One after supper 30 tablet 11   glucose blood (TRUE METRIX BLOOD GLUCOSE TEST) test strip Use as instructed 100 each 12   hydrochlorothiazide (HYDRODIURIL) 25 MG tablet Take 1 tablet (25 mg total) by mouth daily. 90 tablet 0   influenza vac split quadrivalent PF (FLUARIX) 0.5 ML injection Inject into the muscle. 0.5 mL 0   Insulin Pen Needle (BD PEN NEEDLE MICRO U/F) 32G X 6 MM MISC One injection weekly. 100 each 0   lamoTRIgine (LAMICTAL) 25 MG tablet Take 1 tablet by mouth daily for 1 week, 1 tab twice daily for 1 week, 1 tablet three times daily for 1 week, then 2 tabs twice daily 120 tablet 5   nicotine (NICODERM CQ - DOSED IN MG/24 HOURS) 14 mg/24hr patch Place 1 patch onto the skin daily. 28 patch 2   olmesartan (BENICAR) 40 MG tablet Take 1 tablet (40 mg total) by mouth daily. 90 tablet 0   pantoprazole (PROTONIX) 40 MG tablet Take 1 tablet (40 mg total) by mouth daily. 90 tablet 0   traZODone (DESYREL) 100 MG tablet Take 1 tablet (100 mg total) by mouth at bedtime. 30 tablet 1   triamcinolone cream (KENALOG) 0.1 % Apply 1 application. topically 2 (  two) times daily. 30 g 0   TRUEplus Lancets 28G MISC Use as directed 100 each 4   No current facility-administered medications on file prior to visit.    Allergies  Allergen Reactions   Amoxicillin Hives and Rash   Iodine Anaphylaxis and Swelling   Penicillins Hives, Itching and Rash   Shellfish-Derived Products Anaphylaxis   Erythromycin Swelling and Other (See Comments)   Metformin Diarrhea    Social History   Socioeconomic History   Marital status: Single    Spouse name: Not on file   Number of children: Not on file   Years of education: Not on file   Highest education level: High school graduate  Occupational History   Not on file  Tobacco Use    Smoking status: Some Days    Packs/day: 0.50    Years: 22.00    Total pack years: 11.00    Types: Cigarettes    Passive exposure: Current   Smokeless tobacco: Never   Tobacco comments:    4-5 cigs per day 11/08/20//lmr  Vaping Use   Vaping Use: Some days  Substance and Sexual Activity   Alcohol use: Yes    Comment: occasional   Drug use: Not Currently   Sexual activity: Never    Birth control/protection: None  Other Topics Concern   Not on file  Social History Narrative   ** Merged History Encounter **    Lives alone   Right handed   Caffeine: coffee every morning and tea.    Social Determinants of Health   Financial Resource Strain: Not on file  Food Insecurity: Not on file  Transportation Needs: Not on file  Physical Activity: Not on file  Stress: Not on file  Social Connections: Not on file  Intimate Partner Violence: Not on file    Family History  Problem Relation Age of Onset   Hypertension Mother    Diabetes Mother    Diabetes Father    Hypertension Father    Colon cancer Maternal Grandfather    Diabetes Paternal Grandmother    Rectal cancer Neg Hx    Stomach cancer Neg Hx    Esophageal cancer Neg Hx     Past Surgical History:  Procedure Laterality Date   BASAL CELL CARCINOMA EXCISION     ed records 07/26/2020   CERVICAL DISC SURGERY      ROS: Review of Systems Negative except as stated above  PHYSICAL EXAM: BP 119/77 (BP Location: Left Arm, Patient Position: Sitting, Cuff Size: Large)   Pulse 92   Temp 98.3 F (36.8 C)   Resp 16   Ht 5' 7.99" (1.727 m)   Wt 258 lb (117 kg)   SpO2 98%   BMI 39.24 kg/m   Physical Exam HENT:     Head: Normocephalic and atraumatic.  Eyes:     Extraocular Movements: Extraocular movements intact.     Conjunctiva/sclera: Conjunctivae normal.     Pupils: Pupils are equal, round, and reactive to light.  Cardiovascular:     Rate and Rhythm: Normal rate and regular rhythm.     Pulses: Normal pulses.      Heart sounds: Normal heart sounds.  Pulmonary:     Effort: Pulmonary effort is normal.     Breath sounds: Normal breath sounds.  Musculoskeletal:     Cervical back: Normal range of motion and neck supple.  Neurological:     General: No focal deficit present.     Mental Status: She is alert  and oriented to person, place, and time.  Psychiatric:        Mood and Affect: Mood normal.        Behavior: Behavior normal.      ASSESSMENT AND PLAN: 1. Essential (primary) hypertension - Continue Amlodipine,Olmesartan, and Hydrochlorothiazide (50 mg daily) as prescribed. No refills needed as of present.  - Counseled on blood pressure goal of less than 130/80, low-sodium, DASH diet, medication compliance, and 150 minutes of moderate intensity exercise per week as tolerated. Counseled on medication adherence and adverse effects. - Update BMP. - Follow-up with primary provider in 4 months or sooner if needed.  - Basic Metabolic Panel   Patient was given the opportunity to ask questions.  Patient verbalized understanding of the plan and was able to repeat key elements of the plan. Patient was given clear instructions to go to Emergency Department or return to medical center if symptoms don't improve, worsen, or new problems develop.The patient verbalized understanding.   Orders Placed This Encounter  Procedures   Basic Metabolic Panel    Return in about 4 months (around 06/16/2022) for Follow-Up or next available HTN.  Camillia Herter, NP

## 2022-02-14 LAB — BASIC METABOLIC PANEL
BUN/Creatinine Ratio: 17 (ref 9–23)
BUN: 13 mg/dL (ref 6–24)
CO2: 24 mmol/L (ref 20–29)
Calcium: 9.8 mg/dL (ref 8.7–10.2)
Chloride: 105 mmol/L (ref 96–106)
Creatinine, Ser: 0.75 mg/dL (ref 0.57–1.00)
Glucose: 127 mg/dL — ABNORMAL HIGH (ref 70–99)
Potassium: 3.9 mmol/L (ref 3.5–5.2)
Sodium: 143 mmol/L (ref 134–144)
eGFR: 99 mL/min/{1.73_m2} (ref >59)

## 2022-02-21 ENCOUNTER — Ambulatory Visit: Payer: BC Managed Care – PPO | Admitting: Family

## 2022-02-23 ENCOUNTER — Other Ambulatory Visit: Payer: Self-pay | Admitting: Family

## 2022-02-23 DIAGNOSIS — E1142 Type 2 diabetes mellitus with diabetic polyneuropathy: Secondary | ICD-10-CM

## 2022-02-24 ENCOUNTER — Emergency Department (HOSPITAL_BASED_OUTPATIENT_CLINIC_OR_DEPARTMENT_OTHER)
Admission: EM | Admit: 2022-02-24 | Discharge: 2022-02-24 | Disposition: A | Discharge status: Home / Self Care | Payer: BC Managed Care – PPO | Attending: Emergency Medicine | Admitting: Emergency Medicine

## 2022-02-24 ENCOUNTER — Emergency Department (HOSPITAL_BASED_OUTPATIENT_CLINIC_OR_DEPARTMENT_OTHER): Payer: BC Managed Care – PPO

## 2022-02-24 ENCOUNTER — Encounter (HOSPITAL_BASED_OUTPATIENT_CLINIC_OR_DEPARTMENT_OTHER): Payer: Self-pay

## 2022-02-24 DIAGNOSIS — R079 Chest pain, unspecified: Secondary | ICD-10-CM | POA: Diagnosis not present

## 2022-02-24 DIAGNOSIS — M25511 Pain in right shoulder: Secondary | ICD-10-CM | POA: Diagnosis not present

## 2022-02-24 DIAGNOSIS — I10 Essential (primary) hypertension: Secondary | ICD-10-CM | POA: Diagnosis not present

## 2022-02-24 DIAGNOSIS — M19012 Primary osteoarthritis, left shoulder: Secondary | ICD-10-CM | POA: Diagnosis not present

## 2022-02-24 DIAGNOSIS — M25512 Pain in left shoulder: Secondary | ICD-10-CM | POA: Diagnosis not present

## 2022-02-24 DIAGNOSIS — S4991XA Unspecified injury of right shoulder and upper arm, initial encounter: Secondary | ICD-10-CM | POA: Diagnosis not present

## 2022-02-24 DIAGNOSIS — R0789 Other chest pain: Secondary | ICD-10-CM | POA: Diagnosis not present

## 2022-02-24 LAB — CBC WITH DIFFERENTIAL/PLATELET
Abs Immature Granulocytes: 0.04 10*3/uL (ref 0.00–0.07)
Basophils Absolute: 0 10*3/uL (ref 0.0–0.1)
Basophils Relative: 0 %
Eosinophils Absolute: 0.2 10*3/uL (ref 0.0–0.5)
Eosinophils Relative: 2 %
HCT: 38.6 % (ref 36.0–46.0)
Hemoglobin: 12.7 g/dL (ref 12.0–15.0)
Immature Granulocytes: 0 %
Lymphocytes Relative: 30 %
Lymphs Abs: 3.6 10*3/uL (ref 0.7–4.0)
MCH: 28.3 pg (ref 26.0–34.0)
MCHC: 32.9 g/dL (ref 30.0–36.0)
MCV: 86.2 fL (ref 80.0–100.0)
Monocytes Absolute: 0.7 10*3/uL (ref 0.1–1.0)
Monocytes Relative: 6 %
Neutro Abs: 7.3 10*3/uL (ref 1.7–7.7)
Neutrophils Relative %: 62 %
Platelets: 266 10*3/uL (ref 150–400)
RBC: 4.48 MIL/uL (ref 3.87–5.11)
RDW: 13.4 % (ref 11.5–15.5)
WBC: 11.8 10*3/uL — ABNORMAL HIGH (ref 4.0–10.5)
nRBC: 0 % (ref 0.0–0.2)

## 2022-02-24 LAB — BASIC METABOLIC PANEL
Anion gap: 7 (ref 5–15)
BUN: 17 mg/dL (ref 6–20)
CO2: 30 mmol/L (ref 22–32)
Calcium: 9.3 mg/dL (ref 8.9–10.3)
Chloride: 105 mmol/L (ref 98–111)
Creatinine, Ser: 0.75 mg/dL (ref 0.44–1.00)
GFR, Estimated: >60 mL/min (ref >60)
Glucose, Bld: 145 mg/dL — ABNORMAL HIGH (ref 70–99)
Potassium: 3.4 mmol/L — ABNORMAL LOW (ref 3.5–5.1)
Sodium: 142 mmol/L (ref 135–145)

## 2022-02-24 LAB — TROPONIN I (HIGH SENSITIVITY): Troponin I (High Sensitivity): 3 ng/L (ref <18)

## 2022-02-24 MED ORDER — OXYCODONE HCL 5 MG PO TABS
5.0000 mg | ORAL_TABLET | Freq: Once | ORAL | Status: AC
  Administered 2022-02-24: 5 mg via ORAL
  Filled 2022-02-24: qty 1

## 2022-02-24 MED ORDER — ACETAMINOPHEN 500 MG PO TABS
1000.0000 mg | ORAL_TABLET | Freq: Once | ORAL | Status: AC
  Administered 2022-02-24: 1000 mg via ORAL
  Filled 2022-02-24: qty 2

## 2022-02-24 NOTE — Discharge Instructions (Signed)
Take 4 over the counter ibuprofen tablets 3 times a day or 2 over-the-counter naproxen tablets twice a day for pain. Also take tylenol '1000mg'$ (2 extra strength) four times a day.     You have to take your arm out of the sling at least 4 times a day and perform range of motion exercises.  Follow-up with your family doctor.  They may send you to physical therapy if you are having ongoing discomfort or perhaps orthopedics or sports medicine.  Your x-ray did not show a broken bone or dislocation.  Your chest x-ray did not show pneumonia.  Your blood work was not consistent with a heart attack.

## 2022-02-24 NOTE — ED Triage Notes (Signed)
Pt states she had heartburn yesterday, took tums with resolution of pain. Then started having left arm/shoulder blade pain. Pain worse with movement. States it "feels out of socket" and is swollen. Denies known injury.

## 2022-02-24 NOTE — ED Notes (Signed)
Patient transported to X-ray 

## 2022-02-24 NOTE — ED Provider Notes (Signed)
[facility] EMERGENCY DEPARTMENT Provider Note   CSN: [id number] Arrival date & time: 02/24/22  0803     History  Chief Complaint  Patient presents with   Shoulder Pain    Sheri [name] is a 46 y.o. female.  46 yo F with a cc of L shoulder pain.  Going on since last night, initially occurred with what she thought was reflux disease.  This happened about breakfast time.  The chest symptoms seem to resolved but her shoulder symptoms have persisted.  She feels a popping and clicking when she moves that shoulder.  She denies any injury to the shoulder.  She did get pulled forcefully by her 2 pit bulls a couple days ago but denied any symptoms immediately after.  She is worried that it might be out of socket.   Shoulder Pain      Home Medications Prior to Admission medications   Medication Sig Start Date End Date Taking? Authorizing Provider  Semaglutide,0.25 or 0.5MG/DOS, 2 MG/1.5ML SOPN Inject 0.25 mg into the skin once a week. 01/15/22 05/15/22  Camillia Herter, NP  albuterol (VENTOLIN HFA) 108 (90 Base) MCG/ACT inhaler Inhale 2 puffs into the lungs every 6 (six) hours as needed for wheezing or shortness of breath. 01/14/22   Camillia Herter, NP  Alcohol Swabs (ALCOHOL PREP) 70 % PADS One injection weekly. 10/18/20   Camillia Herter, NP  amLODipine (NORVASC) 10 MG tablet Take 1 tablet (10 mg total) by mouth daily. 01/14/22 04/14/22  Camillia Herter, NP  atorvastatin (LIPITOR) 40 MG tablet Take 1 tablet (40 mg total) by mouth daily. 10/23/21 04/21/22  Camillia Herter, NP  Blood Glucose Monitoring Suppl (TRUE METRIX METER) w/Device KIT Use as directed 10/18/20   Camillia Herter, NP  buPROPion [facility] SR) 150 MG 12 hr tablet Take 1 tablet (150 mg total) by mouth 2 (two) times daily. 09/09/21 12/08/21  Camillia Herter, NP  Continuous Blood Gluc Transmit (DEXCOM G6 TRANSMITTER) MISC Use to check blood sugar  4 (four) times daily -  before meals and at bedtime. 09/09/21   Camillia Herter,  NP  COVID-19 mRNA bivalent vaccine, Moderna, (MODERNA COVID-19 BIVAL BOOSTER) 50 MCG/0.5ML injection Inject into the muscle. 06/07/21   Carlyle Basques, MD  DULoxetine (CYMBALTA) 60 MG capsule Take 1 capsule (60 mg total) by mouth daily. 07/01/21 06/26/22  Sater, Nanine Means, MD  famotidine (PEPCID) 20 MG tablet One after supper 11/08/20   Tanda Rockers, MD  glucose blood (TRUE METRIX BLOOD GLUCOSE TEST) test strip Use as instructed 10/18/20   Camillia Herter, NP  hydrochlorothiazide (HYDRODIURIL) 25 MG tablet Take 1 tablet (25 mg total) by mouth daily. 01/14/22 04/14/22  Camillia Herter, NP  influenza vac split quadrivalent PF (FLUARIX) 0.5 ML injection Inject into the muscle. 06/07/21   Carlyle Basques, MD  Insulin Pen Needle (BD PEN NEEDLE MICRO U/F) 32G X 6 MM MISC One injection weekly. 10/22/21   Camillia Herter, NP  lamoTRIgine (LAMICTAL) 25 MG tablet Take 1 tablet by mouth daily for 1 week, 1 tab twice daily for 1 week, 1 tablet three times daily for 1 week, then 2 tabs twice daily 07/01/21   Sater, Nanine Means, MD  nicotine (NICODERM CQ - DOSED IN MG/24 HOURS) 14 mg/24hr patch Place 1 patch onto the skin daily. 09/09/21   Camillia Herter, NP  olmesartan (BENICAR) 40 MG tablet Take 1 tablet (40 mg total) by mouth daily. 01/14/22  04/14/22  Camillia Herter, NP  pantoprazole (PROTONIX) 40 MG tablet Take 1 tablet (40 mg total) by mouth daily. 07/23/21 10/21/21  Camillia Herter, NP  traZODone (DESYREL) 100 MG tablet Take 1 tablet (100 mg total) by mouth at bedtime. 02/27/21 11/15/21  Camillia Herter, NP  triamcinolone cream (KENALOG) 0.1 % Apply 1 application. topically 2 (two) times daily. 01/14/22   Camillia Herter, NP  TRUEplus Lancets 28G MISC Use as directed 10/18/20   Camillia Herter, NP      Allergies    Amoxicillin, Iodine, Penicillins, Shellfish-derived products, Erythromycin, and Metformin    Review of Systems   Review of Systems  Physical Exam Updated Vital Signs BP 111/66 (BP Location: Right Arm)    Pulse 78   Temp 98.6 F (37 C) (Oral)   Resp 12   Ht 5' 9"  (1.753 m)   Wt 117 kg   SpO2 99%   BMI 38.10 kg/m  Physical Exam Vitals and nursing note reviewed.  Constitutional:      General: She is not in acute distress.    Appearance: She is well-developed. She is not diaphoretic.  HENT:     Head: Normocephalic and atraumatic.  Eyes:     Pupils: Pupils are equal, round, and reactive to light.  Cardiovascular:     Rate and Rhythm: Normal rate and regular rhythm.     Heart sounds: No murmur heard.    No friction rub. No gallop.  Pulmonary:     Effort: Pulmonary effort is normal.     Breath sounds: No wheezing or rales.  Abdominal:     General: There is no distension.     Palpations: Abdomen is soft.     Tenderness: There is no abdominal tenderness.  Musculoskeletal:        General: Tenderness present.     Cervical back: Normal range of motion and neck supple.     Comments: PMS intact to the LUE.  Pain with ROM.  Pain about the left trapezius muscle belly.  Skin:    General: Skin is warm and dry.  Neurological:     Mental Status: She is alert and oriented to person, place, and time.  Psychiatric:        Behavior: Behavior normal.     ED Results / Procedures / Treatments   Labs (all labs ordered are listed, but only abnormal results are displayed) Labs Reviewed  CBC WITH DIFFERENTIAL/PLATELET - Abnormal; Notable for the following components:      Result Value   WBC 11.8 (*)    All other components within normal limits  BASIC METABOLIC PANEL - Abnormal; Notable for the following components:   Potassium 3.4 (*)    Glucose, Bld 145 (*)    All other components within normal limits  TROPONIN I (HIGH SENSITIVITY)    EKG EKG Interpretation  Date/Time:  Monday February 24 2022 08:18:48 EDT Ventricular Rate:  89 PR Interval:  132 QRS Duration: 83 QT Interval:  363 QTC Calculation: 442 R Axis:   46 Text Interpretation: Sinus rhythm No old tracing to compare Confirmed  by Deno Etienne [phone]) on 02/24/2022 9:11:18 AM  Radiology DG Chest 2 View  Result Date: 02/24/2022 CLINICAL DATA:  Chest pain EXAM: CHEST - 2 VIEW COMPARISON:  None Available. FINDINGS: The heart size and mediastinal contours are within normal limits. Both lungs are clear. Moderate bilateral glenohumeral osteoarthritis. Anterior cervical discectomy and fusion hardware. Thoracic spondylosis. IMPRESSION: No active cardiopulmonary disease.  Electronically Signed   By: Keane Police D.O.   On: 02/24/2022 09:10   DG Shoulder Left  Result Date: 02/24/2022 CLINICAL DATA:  Chest pain.  Left arm and shoulder blade pain. EXAM: LEFT SHOULDER - 2+ VIEW COMPARISON:  None Available. FINDINGS: There is no evidence of fracture or dislocation. Moderate glenohumeral osteoarthritis with joint space narrowing and marginal osteophytes. Mild acromioclavicular osteoarthritis. Soft tissues are unremarkable. IMPRESSION: 1. No evidence of fracture or dislocation. 2. Moderate glenohumeral osteoarthritis with joint space narrowing and marginal osteophytes. Electronically Signed   By: Keane Police D.O.   On: 02/24/2022 09:09    Procedures Procedures    Medications Ordered in ED Medications  acetaminophen (TYLENOL) tablet 1,000 mg (1,000 mg Oral Given 02/24/22 0909)  oxyCODONE (Oxy IR/ROXICODONE) immediate release tablet 5 mg (5 mg Oral Given 02/24/22 0909)    ED Course/ Medical Decision Making/ A&P                           Medical Decision Making Amount and/or Complexity of Data Reviewed Labs: ordered. Radiology: ordered.  Risk OTC drugs. Prescription drug management.   46 yo F with a chief complaint of left shoulder pain.  Patient clinically has an arthropathy with pops and clicks with motion of the shoulder but she was describing some chest discomfort as well.  We will obtain a chest x-ray to evaluate for possible pneumothorax or left upper lobe pneumonia.  Single troponin with chest symptoms.  I feel she is  completely atypical of pulmonary embolism and no further work-up is needed.  Chest x-ray independently turbid by me without focal show pneumothorax.  Plain film of the left shoulder without dislocation or fracture is independently interpreted by me.  Blood work without significant electrolyte abnormality no significant anemia troponin negative.  Most likely arthropathy.  We will have her in a sling for comfort.  PCP follow-up.  10:31 AM:  I have discussed the diagnosis/risks/treatment options with the patient and family.  Evaluation and diagnostic testing in the emergency department does not suggest an emergent condition requiring admission or immediate intervention beyond what has been performed at this time.  They will follow up with  PCP. We also discussed returning to the ED immediately if new or worsening sx occur. We discussed the sx which are most concerning (e.g., sudden worsening pain, fever, inability to tolerate by mouth) that necessitate immediate return. Medications administered to the patient during their visit and any new prescriptions provided to the patient are listed below.  Medications given during this visit Medications  acetaminophen (TYLENOL) tablet 1,000 mg (1,000 mg Oral Given 02/24/22 0909)  oxyCODONE (Oxy IR/ROXICODONE) immediate release tablet 5 mg (5 mg Oral Given 02/24/22 0909)     The patient appears reasonably screen and/or stabilized for discharge and I doubt any other medical condition or other [facility] requiring further screening, evaluation, or treatment in the ED at this time prior to discharge.          Final Clinical Impression(s) / ED Diagnoses Final diagnoses:  Acute pain of left shoulder    Rx / DC Orders ED Discharge Orders     None         Deno Etienne, DO 02/24/22 1031

## 2022-02-27 NOTE — Telephone Encounter (Signed)
Order complete. 

## 2022-02-27 NOTE — Telephone Encounter (Signed)
Order complete 01/15/2022 for 90 day supply. Call patient to determine medical necessity of early refill request.

## 2022-03-05 DIAGNOSIS — S43492A Other sprain of left shoulder joint, initial encounter: Secondary | ICD-10-CM | POA: Diagnosis not present

## 2022-03-05 DIAGNOSIS — S46002A Unspecified injury of muscle(s) and tendon(s) of the rotator cuff of left shoulder, initial encounter: Secondary | ICD-10-CM | POA: Diagnosis not present

## 2022-03-06 NOTE — Telephone Encounter (Signed)
See previous note

## 2022-03-07 ENCOUNTER — Encounter: Payer: BC Managed Care – PPO | Admitting: Obstetrics & Gynecology

## 2022-03-10 MED ORDER — SEMAGLUTIDE(0.25 OR 0.5MG/DOS) 2 MG/1.5ML ~~LOC~~ SOPN: 0.2500 mg | PEN_INJECTOR | SUBCUTANEOUS | 0 refills | Status: AC

## 2022-03-24 DIAGNOSIS — S43492A Other sprain of left shoulder joint, initial encounter: Secondary | ICD-10-CM | POA: Diagnosis not present

## 2022-03-31 DIAGNOSIS — S43432D Superior glenoid labrum lesion of left shoulder, subsequent encounter: Secondary | ICD-10-CM | POA: Diagnosis not present

## 2022-03-31 DIAGNOSIS — M7522 Bicipital tendinitis, left shoulder: Secondary | ICD-10-CM | POA: Diagnosis not present

## 2022-03-31 DIAGNOSIS — S43432A Superior glenoid labrum lesion of left shoulder, initial encounter: Secondary | ICD-10-CM | POA: Insufficient documentation

## 2022-04-01 ENCOUNTER — Other Ambulatory Visit (HOSPITAL_BASED_OUTPATIENT_CLINIC_OR_DEPARTMENT_OTHER): Payer: Self-pay

## 2022-04-01 ENCOUNTER — Other Ambulatory Visit: Payer: Self-pay | Admitting: Family

## 2022-04-01 DIAGNOSIS — I10 Essential (primary) hypertension: Secondary | ICD-10-CM

## 2022-04-02 ENCOUNTER — Other Ambulatory Visit (HOSPITAL_BASED_OUTPATIENT_CLINIC_OR_DEPARTMENT_OTHER): Payer: Self-pay

## 2022-04-02 ENCOUNTER — Other Ambulatory Visit: Payer: Self-pay | Admitting: Family

## 2022-04-02 DIAGNOSIS — I10 Essential (primary) hypertension: Secondary | ICD-10-CM

## 2022-04-02 MED ORDER — HYDROCHLOROTHIAZIDE 25 MG PO TABS: 25.0000 mg | ORAL_TABLET | Freq: Every day | ORAL | 0 refills | Status: DC

## 2022-04-03 ENCOUNTER — Other Ambulatory Visit (HOSPITAL_BASED_OUTPATIENT_CLINIC_OR_DEPARTMENT_OTHER): Payer: Self-pay

## 2022-04-03 DIAGNOSIS — Z780 Asymptomatic menopausal state: Secondary | ICD-10-CM | POA: Diagnosis not present

## 2022-04-03 DIAGNOSIS — N898 Other specified noninflammatory disorders of vagina: Secondary | ICD-10-CM | POA: Insufficient documentation

## 2022-04-03 DIAGNOSIS — Z1151 Encounter for screening for human papillomavirus (HPV): Secondary | ICD-10-CM | POA: Diagnosis not present

## 2022-04-03 DIAGNOSIS — B9689 Other specified bacterial agents as the cause of diseases classified elsewhere: Secondary | ICD-10-CM | POA: Insufficient documentation

## 2022-04-03 DIAGNOSIS — Z01419 Encounter for gynecological examination (general) (routine) without abnormal findings: Secondary | ICD-10-CM | POA: Insufficient documentation

## 2022-04-03 DIAGNOSIS — N760 Acute vaginitis: Secondary | ICD-10-CM | POA: Diagnosis not present

## 2022-04-03 DIAGNOSIS — Z01411 Encounter for gynecological examination (general) (routine) with abnormal findings: Secondary | ICD-10-CM | POA: Diagnosis not present

## 2022-04-03 MED ORDER — METRONIDAZOLE 500 MG PO TABS
500.0000 mg | ORAL_TABLET | Freq: Two times a day (BID) | ORAL | 0 refills | Status: DC
Start: 2022-04-03 — End: 2022-07-21
  Filled 2022-04-03: qty 14, 7d supply, fill #0

## 2022-04-18 DIAGNOSIS — R87810 Cervical high risk human papillomavirus (HPV) DNA test positive: Secondary | ICD-10-CM | POA: Insufficient documentation

## 2022-04-30 DIAGNOSIS — Z79899 Other long term (current) drug therapy: Secondary | ICD-10-CM | POA: Diagnosis not present

## 2022-04-30 DIAGNOSIS — F17210 Nicotine dependence, cigarettes, uncomplicated: Secondary | ICD-10-CM | POA: Diagnosis not present

## 2022-04-30 DIAGNOSIS — S43432D Superior glenoid labrum lesion of left shoulder, subsequent encounter: Secondary | ICD-10-CM | POA: Diagnosis not present

## 2022-04-30 DIAGNOSIS — J45909 Unspecified asthma, uncomplicated: Secondary | ICD-10-CM | POA: Diagnosis not present

## 2022-04-30 DIAGNOSIS — M25312 Other instability, left shoulder: Secondary | ICD-10-CM | POA: Diagnosis not present

## 2022-04-30 DIAGNOSIS — Z7985 Long-term (current) use of injectable non-insulin antidiabetic drugs: Secondary | ICD-10-CM | POA: Diagnosis not present

## 2022-04-30 DIAGNOSIS — I890 Lymphedema, not elsewhere classified: Secondary | ICD-10-CM | POA: Diagnosis not present

## 2022-04-30 DIAGNOSIS — Z880 Allergy status to penicillin: Secondary | ICD-10-CM | POA: Diagnosis not present

## 2022-04-30 DIAGNOSIS — M7522 Bicipital tendinitis, left shoulder: Secondary | ICD-10-CM | POA: Diagnosis not present

## 2022-04-30 DIAGNOSIS — M25512 Pain in left shoulder: Secondary | ICD-10-CM | POA: Diagnosis not present

## 2022-04-30 DIAGNOSIS — Z6841 Body Mass Index (BMI) 40.0 and over, adult: Secondary | ICD-10-CM | POA: Diagnosis not present

## 2022-04-30 DIAGNOSIS — E669 Obesity, unspecified: Secondary | ICD-10-CM | POA: Diagnosis not present

## 2022-04-30 DIAGNOSIS — Z9889 Other specified postprocedural states: Secondary | ICD-10-CM | POA: Diagnosis not present

## 2022-04-30 DIAGNOSIS — Z4789 Encounter for other orthopedic aftercare: Secondary | ICD-10-CM | POA: Diagnosis not present

## 2022-04-30 DIAGNOSIS — Z888 Allergy status to other drugs, medicaments and biological substances status: Secondary | ICD-10-CM | POA: Diagnosis not present

## 2022-04-30 DIAGNOSIS — S43432A Superior glenoid labrum lesion of left shoulder, initial encounter: Secondary | ICD-10-CM | POA: Diagnosis not present

## 2022-04-30 DIAGNOSIS — E119 Type 2 diabetes mellitus without complications: Secondary | ICD-10-CM | POA: Diagnosis not present

## 2022-04-30 DIAGNOSIS — I10 Essential (primary) hypertension: Secondary | ICD-10-CM | POA: Diagnosis not present

## 2022-04-30 DIAGNOSIS — Z9089 Acquired absence of other organs: Secondary | ICD-10-CM | POA: Diagnosis not present

## 2022-04-30 DIAGNOSIS — Z881 Allergy status to other antibiotic agents status: Secondary | ICD-10-CM | POA: Diagnosis not present

## 2022-04-30 DIAGNOSIS — G8918 Other acute postprocedural pain: Secondary | ICD-10-CM | POA: Diagnosis not present

## 2022-04-30 DIAGNOSIS — Z91013 Allergy to seafood: Secondary | ICD-10-CM | POA: Diagnosis not present

## 2022-05-01 DIAGNOSIS — M25512 Pain in left shoulder: Secondary | ICD-10-CM | POA: Diagnosis not present

## 2022-05-01 DIAGNOSIS — Z4789 Encounter for other orthopedic aftercare: Secondary | ICD-10-CM | POA: Diagnosis not present

## 2022-05-01 DIAGNOSIS — I890 Lymphedema, not elsewhere classified: Secondary | ICD-10-CM | POA: Diagnosis not present

## 2022-05-01 DIAGNOSIS — M7522 Bicipital tendinitis, left shoulder: Secondary | ICD-10-CM | POA: Diagnosis not present

## 2022-05-02 DIAGNOSIS — M7522 Bicipital tendinitis, left shoulder: Secondary | ICD-10-CM | POA: Diagnosis not present

## 2022-05-02 DIAGNOSIS — M25512 Pain in left shoulder: Secondary | ICD-10-CM | POA: Diagnosis not present

## 2022-05-02 DIAGNOSIS — Z4789 Encounter for other orthopedic aftercare: Secondary | ICD-10-CM | POA: Diagnosis not present

## 2022-05-02 DIAGNOSIS — I890 Lymphedema, not elsewhere classified: Secondary | ICD-10-CM | POA: Diagnosis not present

## 2022-05-03 DIAGNOSIS — Z4789 Encounter for other orthopedic aftercare: Secondary | ICD-10-CM | POA: Diagnosis not present

## 2022-05-03 DIAGNOSIS — M25512 Pain in left shoulder: Secondary | ICD-10-CM | POA: Diagnosis not present

## 2022-05-03 DIAGNOSIS — M7522 Bicipital tendinitis, left shoulder: Secondary | ICD-10-CM | POA: Diagnosis not present

## 2022-05-03 DIAGNOSIS — I890 Lymphedema, not elsewhere classified: Secondary | ICD-10-CM | POA: Diagnosis not present

## 2022-05-04 DIAGNOSIS — M25512 Pain in left shoulder: Secondary | ICD-10-CM | POA: Diagnosis not present

## 2022-05-04 DIAGNOSIS — M7522 Bicipital tendinitis, left shoulder: Secondary | ICD-10-CM | POA: Diagnosis not present

## 2022-05-04 DIAGNOSIS — Z4789 Encounter for other orthopedic aftercare: Secondary | ICD-10-CM | POA: Diagnosis not present

## 2022-05-04 DIAGNOSIS — I890 Lymphedema, not elsewhere classified: Secondary | ICD-10-CM | POA: Diagnosis not present

## 2022-05-05 DIAGNOSIS — M25512 Pain in left shoulder: Secondary | ICD-10-CM | POA: Diagnosis not present

## 2022-05-05 DIAGNOSIS — I890 Lymphedema, not elsewhere classified: Secondary | ICD-10-CM | POA: Diagnosis not present

## 2022-05-05 DIAGNOSIS — Z4789 Encounter for other orthopedic aftercare: Secondary | ICD-10-CM | POA: Diagnosis not present

## 2022-05-05 DIAGNOSIS — M7522 Bicipital tendinitis, left shoulder: Secondary | ICD-10-CM | POA: Diagnosis not present

## 2022-05-06 DIAGNOSIS — I890 Lymphedema, not elsewhere classified: Secondary | ICD-10-CM | POA: Diagnosis not present

## 2022-05-06 DIAGNOSIS — Z4789 Encounter for other orthopedic aftercare: Secondary | ICD-10-CM | POA: Diagnosis not present

## 2022-05-06 DIAGNOSIS — M25512 Pain in left shoulder: Secondary | ICD-10-CM | POA: Diagnosis not present

## 2022-05-06 DIAGNOSIS — M7522 Bicipital tendinitis, left shoulder: Secondary | ICD-10-CM | POA: Diagnosis not present

## 2022-05-07 DIAGNOSIS — M7522 Bicipital tendinitis, left shoulder: Secondary | ICD-10-CM | POA: Diagnosis not present

## 2022-05-07 DIAGNOSIS — Z4789 Encounter for other orthopedic aftercare: Secondary | ICD-10-CM | POA: Diagnosis not present

## 2022-05-07 DIAGNOSIS — M25512 Pain in left shoulder: Secondary | ICD-10-CM | POA: Diagnosis not present

## 2022-05-07 DIAGNOSIS — I890 Lymphedema, not elsewhere classified: Secondary | ICD-10-CM | POA: Diagnosis not present

## 2022-05-08 DIAGNOSIS — M25512 Pain in left shoulder: Secondary | ICD-10-CM | POA: Diagnosis not present

## 2022-05-08 DIAGNOSIS — M7522 Bicipital tendinitis, left shoulder: Secondary | ICD-10-CM | POA: Diagnosis not present

## 2022-05-08 DIAGNOSIS — Z4789 Encounter for other orthopedic aftercare: Secondary | ICD-10-CM | POA: Diagnosis not present

## 2022-05-08 DIAGNOSIS — I890 Lymphedema, not elsewhere classified: Secondary | ICD-10-CM | POA: Diagnosis not present

## 2022-05-09 DIAGNOSIS — Z4789 Encounter for other orthopedic aftercare: Secondary | ICD-10-CM | POA: Diagnosis not present

## 2022-05-09 DIAGNOSIS — M25512 Pain in left shoulder: Secondary | ICD-10-CM | POA: Diagnosis not present

## 2022-05-09 DIAGNOSIS — I890 Lymphedema, not elsewhere classified: Secondary | ICD-10-CM | POA: Diagnosis not present

## 2022-05-09 DIAGNOSIS — M7522 Bicipital tendinitis, left shoulder: Secondary | ICD-10-CM | POA: Diagnosis not present

## 2022-05-10 DIAGNOSIS — Z4789 Encounter for other orthopedic aftercare: Secondary | ICD-10-CM | POA: Diagnosis not present

## 2022-05-10 DIAGNOSIS — I890 Lymphedema, not elsewhere classified: Secondary | ICD-10-CM | POA: Diagnosis not present

## 2022-05-10 DIAGNOSIS — M25512 Pain in left shoulder: Secondary | ICD-10-CM | POA: Diagnosis not present

## 2022-05-10 DIAGNOSIS — M7522 Bicipital tendinitis, left shoulder: Secondary | ICD-10-CM | POA: Diagnosis not present

## 2022-05-11 DIAGNOSIS — M7522 Bicipital tendinitis, left shoulder: Secondary | ICD-10-CM | POA: Diagnosis not present

## 2022-05-11 DIAGNOSIS — M25512 Pain in left shoulder: Secondary | ICD-10-CM | POA: Diagnosis not present

## 2022-05-11 DIAGNOSIS — I890 Lymphedema, not elsewhere classified: Secondary | ICD-10-CM | POA: Diagnosis not present

## 2022-05-11 DIAGNOSIS — Z4789 Encounter for other orthopedic aftercare: Secondary | ICD-10-CM | POA: Diagnosis not present

## 2022-05-12 DIAGNOSIS — M7522 Bicipital tendinitis, left shoulder: Secondary | ICD-10-CM | POA: Diagnosis not present

## 2022-05-12 DIAGNOSIS — M25522 Pain in left elbow: Secondary | ICD-10-CM | POA: Diagnosis not present

## 2022-05-12 DIAGNOSIS — M25512 Pain in left shoulder: Secondary | ICD-10-CM | POA: Diagnosis not present

## 2022-05-12 DIAGNOSIS — Z4789 Encounter for other orthopedic aftercare: Secondary | ICD-10-CM | POA: Diagnosis not present

## 2022-05-12 DIAGNOSIS — I890 Lymphedema, not elsewhere classified: Secondary | ICD-10-CM | POA: Diagnosis not present

## 2022-05-12 DIAGNOSIS — M25622 Stiffness of left elbow, not elsewhere classified: Secondary | ICD-10-CM | POA: Diagnosis not present

## 2022-05-13 DIAGNOSIS — I890 Lymphedema, not elsewhere classified: Secondary | ICD-10-CM | POA: Diagnosis not present

## 2022-05-13 DIAGNOSIS — M7522 Bicipital tendinitis, left shoulder: Secondary | ICD-10-CM | POA: Diagnosis not present

## 2022-05-13 DIAGNOSIS — M25512 Pain in left shoulder: Secondary | ICD-10-CM | POA: Diagnosis not present

## 2022-05-13 DIAGNOSIS — Z4789 Encounter for other orthopedic aftercare: Secondary | ICD-10-CM | POA: Diagnosis not present

## 2022-05-14 DIAGNOSIS — Z4789 Encounter for other orthopedic aftercare: Secondary | ICD-10-CM | POA: Diagnosis not present

## 2022-05-14 DIAGNOSIS — M7522 Bicipital tendinitis, left shoulder: Secondary | ICD-10-CM | POA: Diagnosis not present

## 2022-05-14 DIAGNOSIS — M25512 Pain in left shoulder: Secondary | ICD-10-CM | POA: Diagnosis not present

## 2022-05-14 DIAGNOSIS — I890 Lymphedema, not elsewhere classified: Secondary | ICD-10-CM | POA: Diagnosis not present

## 2022-05-15 DIAGNOSIS — Z4789 Encounter for other orthopedic aftercare: Secondary | ICD-10-CM | POA: Diagnosis not present

## 2022-05-15 DIAGNOSIS — I890 Lymphedema, not elsewhere classified: Secondary | ICD-10-CM | POA: Diagnosis not present

## 2022-05-15 DIAGNOSIS — M7522 Bicipital tendinitis, left shoulder: Secondary | ICD-10-CM | POA: Diagnosis not present

## 2022-05-15 DIAGNOSIS — M25512 Pain in left shoulder: Secondary | ICD-10-CM | POA: Diagnosis not present

## 2022-05-16 DIAGNOSIS — Z4789 Encounter for other orthopedic aftercare: Secondary | ICD-10-CM | POA: Diagnosis not present

## 2022-05-16 DIAGNOSIS — M25512 Pain in left shoulder: Secondary | ICD-10-CM | POA: Diagnosis not present

## 2022-05-16 DIAGNOSIS — I890 Lymphedema, not elsewhere classified: Secondary | ICD-10-CM | POA: Diagnosis not present

## 2022-05-16 DIAGNOSIS — M7522 Bicipital tendinitis, left shoulder: Secondary | ICD-10-CM | POA: Diagnosis not present

## 2022-05-17 DIAGNOSIS — I890 Lymphedema, not elsewhere classified: Secondary | ICD-10-CM | POA: Diagnosis not present

## 2022-05-17 DIAGNOSIS — Z4789 Encounter for other orthopedic aftercare: Secondary | ICD-10-CM | POA: Diagnosis not present

## 2022-05-17 DIAGNOSIS — M7522 Bicipital tendinitis, left shoulder: Secondary | ICD-10-CM | POA: Diagnosis not present

## 2022-05-17 DIAGNOSIS — M25512 Pain in left shoulder: Secondary | ICD-10-CM | POA: Diagnosis not present

## 2022-05-18 DIAGNOSIS — M7522 Bicipital tendinitis, left shoulder: Secondary | ICD-10-CM | POA: Diagnosis not present

## 2022-05-18 DIAGNOSIS — M25512 Pain in left shoulder: Secondary | ICD-10-CM | POA: Diagnosis not present

## 2022-05-18 DIAGNOSIS — I890 Lymphedema, not elsewhere classified: Secondary | ICD-10-CM | POA: Diagnosis not present

## 2022-05-18 DIAGNOSIS — Z4789 Encounter for other orthopedic aftercare: Secondary | ICD-10-CM | POA: Diagnosis not present

## 2022-05-19 ENCOUNTER — Encounter: Payer: BC Managed Care – PPO | Admitting: Obstetrics & Gynecology

## 2022-05-19 DIAGNOSIS — M7522 Bicipital tendinitis, left shoulder: Secondary | ICD-10-CM | POA: Diagnosis not present

## 2022-05-19 DIAGNOSIS — Z4789 Encounter for other orthopedic aftercare: Secondary | ICD-10-CM | POA: Diagnosis not present

## 2022-05-19 DIAGNOSIS — I890 Lymphedema, not elsewhere classified: Secondary | ICD-10-CM | POA: Diagnosis not present

## 2022-05-19 DIAGNOSIS — M25512 Pain in left shoulder: Secondary | ICD-10-CM | POA: Diagnosis not present

## 2022-05-20 DIAGNOSIS — Z4789 Encounter for other orthopedic aftercare: Secondary | ICD-10-CM | POA: Diagnosis not present

## 2022-05-20 DIAGNOSIS — N888 Other specified noninflammatory disorders of cervix uteri: Secondary | ICD-10-CM | POA: Diagnosis not present

## 2022-05-20 DIAGNOSIS — R87810 Cervical high risk human papillomavirus (HPV) DNA test positive: Secondary | ICD-10-CM | POA: Diagnosis not present

## 2022-05-20 DIAGNOSIS — M25512 Pain in left shoulder: Secondary | ICD-10-CM | POA: Diagnosis not present

## 2022-05-20 DIAGNOSIS — I890 Lymphedema, not elsewhere classified: Secondary | ICD-10-CM | POA: Diagnosis not present

## 2022-05-20 DIAGNOSIS — M7522 Bicipital tendinitis, left shoulder: Secondary | ICD-10-CM | POA: Diagnosis not present

## 2022-05-21 DIAGNOSIS — I890 Lymphedema, not elsewhere classified: Secondary | ICD-10-CM | POA: Diagnosis not present

## 2022-05-21 DIAGNOSIS — M25512 Pain in left shoulder: Secondary | ICD-10-CM | POA: Diagnosis not present

## 2022-05-21 DIAGNOSIS — M7522 Bicipital tendinitis, left shoulder: Secondary | ICD-10-CM | POA: Diagnosis not present

## 2022-05-21 DIAGNOSIS — Z4789 Encounter for other orthopedic aftercare: Secondary | ICD-10-CM | POA: Diagnosis not present

## 2022-05-22 DIAGNOSIS — M7522 Bicipital tendinitis, left shoulder: Secondary | ICD-10-CM | POA: Diagnosis not present

## 2022-05-22 DIAGNOSIS — Z4789 Encounter for other orthopedic aftercare: Secondary | ICD-10-CM | POA: Diagnosis not present

## 2022-05-22 DIAGNOSIS — M25512 Pain in left shoulder: Secondary | ICD-10-CM | POA: Diagnosis not present

## 2022-05-22 DIAGNOSIS — I890 Lymphedema, not elsewhere classified: Secondary | ICD-10-CM | POA: Diagnosis not present

## 2022-05-27 ENCOUNTER — Encounter: Payer: Self-pay | Admitting: General Practice

## 2022-06-02 DIAGNOSIS — Z4789 Encounter for other orthopedic aftercare: Secondary | ICD-10-CM | POA: Diagnosis not present

## 2022-06-02 DIAGNOSIS — M25512 Pain in left shoulder: Secondary | ICD-10-CM | POA: Diagnosis not present

## 2022-06-02 DIAGNOSIS — M25622 Stiffness of left elbow, not elsewhere classified: Secondary | ICD-10-CM | POA: Diagnosis not present

## 2022-06-02 DIAGNOSIS — M25522 Pain in left elbow: Secondary | ICD-10-CM | POA: Diagnosis not present

## 2022-06-09 DIAGNOSIS — M25512 Pain in left shoulder: Secondary | ICD-10-CM | POA: Diagnosis not present

## 2022-06-09 DIAGNOSIS — M25522 Pain in left elbow: Secondary | ICD-10-CM | POA: Diagnosis not present

## 2022-06-09 DIAGNOSIS — Z4789 Encounter for other orthopedic aftercare: Secondary | ICD-10-CM | POA: Diagnosis not present

## 2022-06-09 DIAGNOSIS — M25622 Stiffness of left elbow, not elsewhere classified: Secondary | ICD-10-CM | POA: Diagnosis not present

## 2022-06-16 DIAGNOSIS — M25522 Pain in left elbow: Secondary | ICD-10-CM | POA: Diagnosis not present

## 2022-06-16 DIAGNOSIS — M25512 Pain in left shoulder: Secondary | ICD-10-CM | POA: Diagnosis not present

## 2022-06-16 DIAGNOSIS — Z4789 Encounter for other orthopedic aftercare: Secondary | ICD-10-CM | POA: Diagnosis not present

## 2022-06-16 DIAGNOSIS — M25622 Stiffness of left elbow, not elsewhere classified: Secondary | ICD-10-CM | POA: Diagnosis not present

## 2022-06-24 DIAGNOSIS — M25522 Pain in left elbow: Secondary | ICD-10-CM | POA: Diagnosis not present

## 2022-06-24 DIAGNOSIS — Z4789 Encounter for other orthopedic aftercare: Secondary | ICD-10-CM | POA: Diagnosis not present

## 2022-06-24 DIAGNOSIS — M25622 Stiffness of left elbow, not elsewhere classified: Secondary | ICD-10-CM | POA: Diagnosis not present

## 2022-06-24 DIAGNOSIS — M25512 Pain in left shoulder: Secondary | ICD-10-CM | POA: Diagnosis not present

## 2022-07-16 NOTE — Progress Notes (Signed)
Patient ID: Sheri [name], female    DOB: [birth year]  MRN: [id number]  CC: Chronic Care Management  Subjective: Sheri [name] is a 46 y.o. female who presents for chronic care management.   Her concerns today include:  - Has not taken blood pressure medications in at least 3 months. She initially quit taking blood pressure medications around 3 months ago under directive of the surgeon to prepare for shoulder surgery. Since then she did not resume the regimen. States sometimes she takes the blood pressure medications "every other day". She is not checking blood pressures out of office. Denies red flag symptoms.  - Reports she doesn't feel Ozempic is working stating she hasn't lost any weight. She is not ready for referral to a weight specialist. We discussed trial of Mounjaro as alternative to Ozempic to see if this helps with diabetes control and weight loss. I discussed with patient I will make a recommendation once her A1c results. Patient agreeable.  - Needs refills of cholesterol medications.   Patient Active Problem List   Diagnosis Date Noted   Shoulder instability, left 04/30/2022   Papanicolaou smear of cervix with positive high risk human papilloma virus (HPV) test 04/18/2022   Bacterial vaginosis 04/03/2022   Encounter for annual routine gynecological examination 04/03/2022   Menopause 04/03/2022   Vaginal discharge 04/03/2022   Superior glenoid labrum lesion of left shoulder 03/31/2022   Severe obesity (BMI 35.0-39.9) with comorbidity ([place]) 09/11/2021   Meralgia paresthetica 03/08/2021   Neuropathy 01/02/2021   Bilateral sciatica 01/02/2021   Upper airway cough syndrome 11/08/2020   Cigarette smoker 11/08/2020   Type 2 diabetes mellitus ([place]) 10/18/2020   Hyperlipidemia 10/18/2020   Rectal bleeding 09/27/2020   Essential hypertension 09/27/2020   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 8 g 1   Alcohol Swabs (ALCOHOL PREP) 70 % PADS One injection weekly. 100 each 1   Blood Glucose Monitoring Suppl (TRUE METRIX METER) w/Device KIT Use as directed 1 kit 0   buPROPion (WELLBUTRIN SR) 150 MG 12 hr tablet Take 1 tablet (150 mg total) by mouth 2 (two) times daily. 180 tablet 0   DULoxetine (CYMBALTA) 60 MG capsule Take 1 capsule (60 mg total) by mouth daily. 90 capsule 3   famotidine (PEPCID) 20 MG tablet One after supper 30 tablet 11   glucose blood (TRUE METRIX BLOOD GLUCOSE TEST) test strip Use as instructed 100 each 12   influenza vac split quadrivalent PF (FLUARIX) 0.5 ML injection Inject into the muscle. 0.5 mL 0   Insulin Pen Needle (BD PEN NEEDLE MICRO U/F) 32G X 6 MM MISC One injection weekly. 100 each 0   nicotine (NICODERM CQ - DOSED IN MG/24 HOURS) 14 mg/24hr patch Place 1 patch onto the skin daily. 28 patch 2   pantoprazole (PROTONIX) 40 MG tablet Take 1 tablet (40 mg total) by mouth daily. 90 tablet 0   traZODone (DESYREL) 100 MG tablet Take 1 tablet (100 mg total) by mouth at bedtime. 30 tablet 1   TRUEplus Lancets 28G MISC Use as directed 100 each 4   No current facility-administered medications on file prior to visit.    Allergies  Allergen  Reactions   Amoxicillin Hives and Rash   Iodine Anaphylaxis and Swelling   Penicillins Hives, Itching and Rash   Shellfish-Derived Products Anaphylaxis   Erythromycin Swelling and Other (See Comments)   Metformin Diarrhea    Social History   Socioeconomic History   Marital status: Single    Spouse name: Not on file   Number of children: Not on file   Years of education: Not on file   Highest education level: High school graduate  Occupational History   Not on file  Tobacco Use   Smoking status:  Some Days    Packs/day: 0.50    Years: 22.00    Total pack years: 11.00    Types: Cigarettes    Passive exposure: Current   Smokeless tobacco: Never   Tobacco comments:    4-5 cigs per day 11/08/20//lmr  Vaping Use   Vaping Use: Some days  Substance and Sexual Activity   Alcohol use: Yes    Comment: occasional   Drug use: Not Currently   Sexual activity: Never    Birth control/protection: None  Other Topics Concern   Not on file  Social History Narrative   ** Merged History Encounter **    Lives alone   Right handed   Caffeine: coffee every morning and tea.    Social Determinants of Health   Financial Resource Strain: Not on file  Food Insecurity: Not on file  Transportation Needs: Not on file  Physical Activity: Not on file  Stress: Not on file  Social Connections: Not on file  Intimate Partner Violence: Not on file    Family History  Problem Relation Age of Onset   Hypertension Mother    Diabetes Mother    Diabetes Father    Hypertension Father    Colon cancer Maternal Grandfather    Diabetes Paternal Grandmother    Rectal cancer Neg Hx    Stomach cancer Neg Hx    Esophageal cancer Neg Hx     Past Surgical History:  Procedure Laterality Date   BASAL CELL CARCINOMA EXCISION     ed records 07/26/2020   CERVICAL DISC SURGERY      ROS: Review of Systems Negative except as stated above  PHYSICAL EXAM: BP (!) 151/84   Pulse 88   Temp 98.3 F (36.8 C)   Resp 16   Ht 5' 7.99" (1.727 m)   Wt 260 lb (117.9 kg)   SpO2 98%   BMI 39.54 kg/m   Physical Exam HENT:     Head: Normocephalic and atraumatic.  Eyes:     Extraocular Movements: Extraocular movements intact.     Conjunctiva/sclera: Conjunctivae normal.     Pupils: Pupils are equal, round, and reactive to light.  Cardiovascular:     Rate and Rhythm: Normal rate and regular rhythm.     Pulses: Normal pulses.     Heart sounds: Normal heart sounds.  Pulmonary:     Effort: Pulmonary effort is  normal.     Breath sounds: Normal breath sounds.  Musculoskeletal:     Cervical back: Normal range of motion and neck supple.  Neurological:     General: No focal deficit present.     Mental Status: She is alert and oriented to person, place, and time.  Psychiatric:        Mood and Affect: Mood normal.        Behavior: Behavior normal.     ASSESSMENT AND PLAN: 1. Primary hypertension - Blood pressure  not at goal during today's visit. Patient asymptomatic without chest pressure, chest pain, palpitations, shortness of breath, worst headache of life, and any additional red flag symptoms. - Patient reports she has not taken blood pressure medications in 3 months. - Initial blood pressure 176/118. Hydralazine administered once in office today. Rechecked blood pressure 151/84.  - Resume Amlodipine, Olmesartan, and Hydrochlorothiazide as prescribed.  - Counseled on blood pressure goal of less than 130/80, low-sodium, DASH diet, medication compliance, and 150 minutes of moderate intensity exercise per week as tolerated. Counseled on medication adherence and adverse effects. - Follow-up with primary provider in 2 weeks or sooner if needed for blood pressure check. - hydrALAZINE (APRESOLINE) tablet 25 mg - amLODipine (NORVASC) 10 MG tablet; Take 1 tablet (10 mg total) by mouth daily.  Dispense: 30 tablet; Refill: 2 - olmesartan (BENICAR) 40 MG tablet; Take 1 tablet (40 mg total) by mouth daily.  Dispense: 30 tablet; Refill: 2 - hydrochlorothiazide (HYDRODIURIL) 25 MG tablet; Take 1 tablet (25 mg total) by mouth daily.  Dispense: 30 tablet; Refill: 2  2. Type 2 diabetes mellitus with diabetic polyneuropathy, without long-term current use of insulin (HCC) - Hemoglobin A1c result pending.  - Discussed the importance of healthy eating habits, low-carbohydrate diet, low-sugar diet, regular aerobic exercise (at least 150 minutes a week as tolerated) and medication compliance to achieve or maintain  control of diabetes. - Follow-up with primary provider as scheduled.  - Hemoglobin A1c  3. Hyperlipidemia, unspecified hyperlipidemia type - Continue Atorvastatin as prescribed.  - Follow-up with primary provider as scheduled.  - atorvastatin (LIPITOR) 40 MG tablet; Take 1 tablet (40 mg total) by mouth daily.  Dispense: 30 tablet; Refill: 2   Patient was given the opportunity to ask questions.  Patient verbalized understanding of the plan and was able to repeat key elements of the plan. Patient was given clear instructions to go to Emergency Department or return to medical center if symptoms don't improve, worsen, or new problems develop.The patient verbalized understanding.   Orders Placed This Encounter  Procedures   Hemoglobin A1c    Requested Prescriptions   Signed Prescriptions Disp Refills   amLODipine (NORVASC) 10 MG tablet 30 tablet 2    Sig: Take 1 tablet (10 mg total) by mouth daily.   olmesartan (BENICAR) 40 MG tablet 30 tablet 2    Sig: Take 1 tablet (40 mg total) by mouth daily.   hydrochlorothiazide (HYDRODIURIL) 25 MG tablet 30 tablet 2    Sig: Take 1 tablet (25 mg total) by mouth daily.   atorvastatin (LIPITOR) 40 MG tablet 30 tablet 2    Sig: Take 1 tablet (40 mg total) by mouth daily.    Return in about 2 weeks (around 08/04/2022) for Follow-Up or next available bp check .  Camillia Herter, NP

## 2022-07-18 ENCOUNTER — Ambulatory Visit: Payer: BC Managed Care – PPO | Admitting: Family

## 2022-07-21 ENCOUNTER — Encounter: Payer: Self-pay | Admitting: Family

## 2022-07-21 ENCOUNTER — Ambulatory Visit (INDEPENDENT_AMBULATORY_CARE_PROVIDER_SITE_OTHER): Payer: BC Managed Care – PPO | Admitting: Family

## 2022-07-21 VITALS — BP 151/84 | HR 88 | Temp 98.3°F | Resp 16 | Ht 67.99 in | Wt 260.0 lb

## 2022-07-21 DIAGNOSIS — I10 Essential (primary) hypertension: Secondary | ICD-10-CM

## 2022-07-21 DIAGNOSIS — E785 Hyperlipidemia, unspecified: Secondary | ICD-10-CM

## 2022-07-21 DIAGNOSIS — E1142 Type 2 diabetes mellitus with diabetic polyneuropathy: Secondary | ICD-10-CM | POA: Diagnosis not present

## 2022-07-21 MED ORDER — OLMESARTAN MEDOXOMIL 40 MG PO TABS
40.0000 mg | ORAL_TABLET | Freq: Every day | ORAL | 2 refills | Status: DC
Start: 2022-07-21 — End: 2022-10-22

## 2022-07-21 MED ORDER — ATORVASTATIN CALCIUM 40 MG PO TABS: 40.0000 mg | ORAL_TABLET | Freq: Every day | ORAL | 2 refills | Status: DC

## 2022-07-21 MED ORDER — HYDROCHLOROTHIAZIDE 25 MG PO TABS
25.0000 mg | ORAL_TABLET | Freq: Every day | ORAL | 2 refills | Status: DC
Start: 2022-07-21 — End: 2022-09-10

## 2022-07-21 MED ORDER — AMLODIPINE BESYLATE 10 MG PO TABS
10.0000 mg | ORAL_TABLET | Freq: Every day | ORAL | 2 refills | Status: DC
Start: 2022-07-21 — End: 2022-09-10

## 2022-07-21 MED ORDER — HYDRALAZINE HCL 10 MG PO TABS: 25.0000 mg | ORAL_TABLET | Freq: Once | ORAL | Status: DC

## 2022-07-21 NOTE — Progress Notes (Signed)
.  Pt presents for chronic care management

## 2022-07-22 ENCOUNTER — Other Ambulatory Visit: Payer: Self-pay | Admitting: Family

## 2022-07-22 DIAGNOSIS — E1142 Type 2 diabetes mellitus with diabetic polyneuropathy: Secondary | ICD-10-CM

## 2022-07-22 LAB — HEMOGLOBIN A1C
Est. average glucose Bld gHb Est-mCnc: 200 mg/dL
Hgb A1c MFr Bld: 8.6 % — ABNORMAL HIGH (ref 4.8–5.6)

## 2022-07-22 MED ORDER — TIRZEPATIDE 2.5 MG/0.5ML ~~LOC~~ SOAJ: 2.5000 mg | SUBCUTANEOUS | 0 refills | Status: DC

## 2022-07-22 MED ORDER — BD PEN NEEDLE MICRO U/F 32G X 6 MM MISC: 0 refills | Status: AC

## 2022-07-30 NOTE — Progress Notes (Signed)
Patient ID: Sheri [name], female    DOB: July 10, 1976  MRN: [id number]  CC: Blood Pressure Check  Subjective: Sheri [name] is a 46 y.o. female who presents for blood pressure check.   Her concerns today include:  Doing well on blood pressure medications, no issues/concerns. Since beginning [facility] doing well, no issues/concerns. Today she was provided with office samples of Mounjaro at current prescribed dose.   Patient Active Problem List   Diagnosis Date Noted   Shoulder instability, left 04/30/2022   Papanicolaou smear of cervix with positive high risk human papilloma virus (HPV) test 04/18/2022   Bacterial vaginosis 04/03/2022   Encounter for annual routine gynecological examination 04/03/2022   Menopause 04/03/2022   Vaginal discharge 04/03/2022   Superior glenoid labrum lesion of left shoulder 03/31/2022   Severe obesity (BMI 35.0-39.9) with comorbidity ([place]) 09/11/2021   Meralgia paresthetica 03/08/2021   Neuropathy 01/02/2021   Bilateral sciatica 01/02/2021   Upper airway cough syndrome 11/08/2020   Cigarette smoker 11/08/2020   Type 2 diabetes mellitus ([place]) 10/18/2020   Hyperlipidemia 10/18/2020   Rectal bleeding 09/27/2020   Essential hypertension 09/27/2020   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 8 g 1   Alcohol Swabs (ALCOHOL PREP) 70 % PADS One injection weekly. 100 each 1   amLODipine (NORVASC) 10 MG tablet Take 1 tablet (10 mg total) by mouth daily. 30 tablet 2   atorvastatin (LIPITOR) 40 MG tablet Take 1 tablet (40 mg total) by mouth daily. 30 tablet 2   Blood Glucose Monitoring Suppl (TRUE METRIX METER) w/Device KIT Use as directed 1 kit 0   buPROPion  (WELLBUTRIN SR) 150 MG 12 hr tablet Take 1 tablet (150 mg total) by mouth 2 (two) times daily. 180 tablet 0   cetirizine (ZYRTEC) 10 MG tablet Take 10 mg by mouth daily.     DULoxetine (CYMBALTA) 60 MG capsule Take 1 capsule (60 mg total) by mouth daily. 90 capsule 3   famotidine (PEPCID) 20 MG tablet One after supper 30 tablet 11   glucose blood (TRUE METRIX BLOOD GLUCOSE TEST) test strip Use as instructed 100 each 12   hydrochlorothiazide (HYDRODIURIL) 25 MG tablet Take 1 tablet (25 mg total) by mouth daily. 30 tablet 2   influenza vac split quadrivalent PF (FLUARIX) 0.5 ML injection Inject into the muscle. 0.5 mL 0   Insulin Pen Needle (BD PEN NEEDLE MICRO U/F) 32G X 6 MM MISC One injection weekly. 100 each 0   nicotine (NICODERM CQ - DOSED IN MG/24 HOURS) 14 mg/24hr patch Place 1 patch onto the skin daily. 28 patch 2   olmesartan (BENICAR) 40 MG tablet Take 1 tablet (40 mg total) by mouth daily. 30 tablet 2   pantoprazole (PROTONIX) 40 MG tablet Take 1 tablet (40 mg total) by mouth daily. 90 tablet 0   tirzepatide (MOUNJARO) 2.5 MG/0.5ML Pen Inject 2.5 mg into the skin once a week. 2 mL 0   traZODone (DESYREL) 100 MG tablet Take 1 tablet (100 mg total) by mouth at bedtime. 30 tablet 1   TRUEplus Lancets 28G MISC Use as directed 100 each 4   Current Facility-Administered Medications on File Prior to Visit  Medication Dose Route Frequency Provider Last Rate Last Admin   hydrALAZINE (APRESOLINE) tablet 25 mg  25 mg Oral Once Camillia Herter, NP        Allergies  Allergen Reactions   Amoxicillin Hives and Rash   Iodine Anaphylaxis and Swelling   Penicillins Hives, Itching and Rash   Shellfish-Derived Products Anaphylaxis   Erythromycin Swelling and Other (See Comments)   Metformin Diarrhea    Social History   Socioeconomic History   Marital status: Single    Spouse name: Not on file   Number of children: Not on file   Years of education: Not on file   Highest education level:  High school graduate  Occupational History   Not on file  Tobacco Use   Smoking status: Some Days    Packs/day: 0.50    Years: 22.00    Total pack years: 11.00    Types: Cigarettes    Passive exposure: Current   Smokeless tobacco: Never   Tobacco comments:    4-5 cigs per day 11/08/20//lmr  Vaping Use   Vaping Use: Some days  Substance and Sexual Activity   Alcohol use: Yes    Comment: occasional   Drug use: Not Currently   Sexual activity: Never    Birth control/protection: None  Other Topics Concern   Not on file  Social History Narrative   ** Merged History Encounter **    Lives alone   Right handed   Caffeine: coffee every morning and tea.    Social Determinants of Health   Financial Resource Strain: Not on file  Food Insecurity: Not on file  Transportation Needs: Not on file  Physical Activity: Not on file  Stress: Not on file  Social Connections: Not on file  Intimate Partner Violence: Not on file    Family History  Problem Relation Age of Onset   Hypertension Mother    Diabetes Mother    Diabetes Father    Hypertension Father    Colon cancer Maternal Grandfather    Diabetes Paternal Grandmother    Rectal cancer Neg Hx    Stomach cancer Neg Hx    Esophageal cancer Neg Hx     Past Surgical History:  Procedure Laterality Date   BASAL CELL CARCINOMA EXCISION     ed records 07/26/2020   CERVICAL DISC SURGERY      ROS: Review of Systems Negative except as stated above  PHYSICAL EXAM: BP 122/82 (BP Location: Left Arm, Patient Position: Sitting, Cuff Size: Large)   Pulse (!) 106   Temp 98.3 F (36.8 C)   Resp 16   Ht 5' 7.99" (1.727 m)   Wt 258 lb (117 kg)   SpO2 96%   BMI 39.24 kg/m   Physical Exam HENT:     Head: Normocephalic and atraumatic.  Eyes:     Extraocular Movements: Extraocular movements intact.     Conjunctiva/sclera: Conjunctivae normal.     Pupils: Pupils are equal, round, and reactive to light.  Cardiovascular:     Rate  and Rhythm: Tachycardia present.     Pulses: Normal pulses.     Heart sounds: Normal heart sounds.  Pulmonary:     Effort: Pulmonary effort is normal.     Breath sounds: Normal breath sounds.  Musculoskeletal:     Cervical back: Normal range of motion and neck supple.  Neurological:     General: No focal deficit present.     Mental Status: She is alert and oriented  to person, place, and time.  Psychiatric:        Mood and Affect: Mood normal.        Behavior: Behavior normal.    ASSESSMENT AND PLAN: 1. Primary hypertension - Blood pressure improved compared to previous visit. - Continue Amlodipine, Hydrochlorothiazide, and Olmesartan as prescribed. No refills needed as of present.  - Counseled on blood pressure goal of less than 130/80, low-sodium, DASH diet, medication compliance, and 150 minutes of moderate intensity exercise per week as tolerated. Counseled on medication adherence and adverse effects. - Follow-up with primary provider in 3 months or sooner if needed.   2. Type 2 diabetes mellitus with diabetic polyneuropathy, without long-term current use of insulin (HCC) - Continue Tirzepatide as prescribed. No refills needed as of present.  - Patient intolerant to Metformin.  - Patient does not prefer Semaglutide. - Discussed the importance of healthy eating habits, low-carbohydrate diet, low-sugar diet, regular aerobic exercise (at least 150 minutes a week as tolerated) and medication compliance to achieve or maintain control of diabetes. - Hemoglobin A1c 8.6% on 07/21/2022. Patient to follow-up in 4 weeks or sooner if needed will recheck hemoglobin A1c at that time.    Patient was given the opportunity to ask questions.  Patient verbalized understanding of the plan and was able to repeat key elements of the plan. Patient was given clear instructions to go to Emergency Department or return to medical center if symptoms don't improve, worsen, or new problems develop.The patient  verbalized understanding.   No orders of the defined types were placed in this encounter.    Requested Prescriptions    No prescriptions requested or ordered in this encounter    Return in about 3 months (around 11/05/2022) for Follow-Up or next available chronic care mgmt.  Camillia Herter, NP

## 2022-08-06 ENCOUNTER — Ambulatory Visit (INDEPENDENT_AMBULATORY_CARE_PROVIDER_SITE_OTHER): Payer: BC Managed Care – PPO | Admitting: Family

## 2022-08-06 ENCOUNTER — Ambulatory Visit: Payer: BC Managed Care – PPO | Admitting: Family

## 2022-08-06 ENCOUNTER — Encounter: Payer: Self-pay | Admitting: Family

## 2022-08-06 VITALS — BP 122/82 | HR 106 | Temp 98.3°F | Resp 16 | Ht 67.99 in | Wt 258.0 lb

## 2022-08-06 DIAGNOSIS — I10 Essential (primary) hypertension: Secondary | ICD-10-CM

## 2022-08-06 DIAGNOSIS — E1142 Type 2 diabetes mellitus with diabetic polyneuropathy: Secondary | ICD-10-CM

## 2022-08-06 NOTE — Progress Notes (Signed)
.  Pt presents for blood pressure check

## 2022-09-08 NOTE — Progress Notes (Unsigned)
Patient ID: Sheri [name], female    DOB: 27-Apr-1976  MRN: [id number]  CC: Diabetes Follow-Up  Subjective: Sheri [name] is a 47 y.o. female who presents for diabetes follow-up.   Her concerns today include:  Doing well on Mounjaro, no issues/concerns. Denies red flag symptoms. However, concerned that she is not losing weight on Mounjaro as she expected to. She is monitoring what she eats and exercising when able to do so. She is not ready for referral to a nutritionist or Medical Weight Management. She is aware to notify me if she like a referral in the future. She is not checking blood sugars at home often due to she does not like pricking her finger. She would like to restart the continuous blood sugar monitor. Requests refills on blood pressure medications (except for Olmesartan states she has this) and cholesterol medication.   Patient Active Problem List   Diagnosis Date Noted   Shoulder instability, left 04/30/2022   Papanicolaou smear of cervix with positive high risk human papilloma virus (HPV) test 04/18/2022   Bacterial vaginosis 04/03/2022   Encounter for annual routine gynecological examination 04/03/2022   Menopause 04/03/2022   Vaginal discharge 04/03/2022   Superior glenoid labrum lesion of left shoulder 03/31/2022   Severe obesity (BMI 35.0-39.9) with comorbidity ([place]) 09/11/2021   Meralgia paresthetica 03/08/2021   Neuropathy 01/02/2021   Bilateral sciatica 01/02/2021   Upper airway cough syndrome 11/08/2020   Cigarette smoker 11/08/2020   Type 2 diabetes mellitus ([place]) 10/18/2020   Hyperlipidemia 10/18/2020   Rectal bleeding 09/27/2020   Essential hypertension 09/27/2020   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 8 g 1   Alcohol Swabs (ALCOHOL PREP) 70 % PADS One injection weekly. 100 each 1   Blood Glucose Monitoring Suppl (TRUE METRIX METER) w/Device KIT Use as directed 1 kit 0   cetirizine (ZYRTEC) 10 MG tablet Take 10 mg by mouth daily.     famotidine (PEPCID) 20 MG tablet One after supper 30 tablet 11   glucose blood (TRUE METRIX BLOOD GLUCOSE TEST) test strip Use as instructed 100 each 12   influenza vac split quadrivalent PF (FLUARIX) 0.5 ML injection Inject into the muscle. 0.5 mL 0   Insulin Pen Needle (BD PEN NEEDLE MICRO U/F) 32G X 6 MM MISC One injection weekly. 100 each 0   nicotine (NICODERM CQ - DOSED IN MG/24 HOURS) 14 mg/24hr patch Place 1 patch onto the skin daily. 28 patch 2   olmesartan (BENICAR) 40 MG tablet Take 1 tablet (40 mg total) by mouth daily. 30 tablet 2   TRUEplus Lancets 28G MISC Use as directed 100 each 4   buPROPion (WELLBUTRIN SR) 150 MG 12 hr tablet Take 1 tablet (150 mg total) by mouth 2 (two) times daily. 180 tablet 0   DULoxetine (CYMBALTA) 60 MG capsule Take 1 capsule (60 mg total) by mouth daily. 90 capsule 3   pantoprazole (PROTONIX) 40 MG tablet Take 1 tablet (40 mg total) by mouth daily. 90 tablet 0   traZODone (DESYREL) 100 MG tablet Take 1 tablet (100 mg total) by mouth at bedtime. 30 tablet 1   Current Facility-Administered Medications on File Prior to  Visit  Medication Dose Route Frequency Provider Last Rate Last Admin   hydrALAZINE (APRESOLINE) tablet 25 mg  25 mg Oral Once Camillia Herter, NP        Allergies  Allergen Reactions   Amoxicillin Hives and Rash   Iodine Anaphylaxis and Swelling   Penicillins Hives, Itching and Rash   Shellfish-Derived Products Anaphylaxis   Erythromycin Swelling and Other (See Comments)   Metformin Diarrhea    Social History   Socioeconomic History   Marital status: Single    Spouse name: Not on file   Number of children: Not on file   Years of education:  Not on file   Highest education level: High school graduate  Occupational History   Not on file  Tobacco Use   Smoking status: Some Days    Packs/day: 0.50    Years: 22.00    Total pack years: 11.00    Types: Cigarettes    Passive exposure: Current   Smokeless tobacco: Never   Tobacco comments:    4-5 cigs per day 11/08/20//lmr  Vaping Use   Vaping Use: Some days  Substance and Sexual Activity   Alcohol use: Yes    Comment: occasional   Drug use: Not Currently   Sexual activity: Never    Birth control/protection: None  Other Topics Concern   Not on file  Social History Narrative   ** Merged History Encounter **    Lives alone   Right handed   Caffeine: coffee every morning and tea.    Social Determinants of Health   Financial Resource Strain: Not on file  Food Insecurity: Not on file  Transportation Needs: Not on file  Physical Activity: Not on file  Stress: Not on file  Social Connections: Not on file  Intimate Partner Violence: Not on file    Family History  Problem Relation Age of Onset   Hypertension Mother    Diabetes Mother    Diabetes Father    Hypertension Father    Colon cancer Maternal Grandfather    Diabetes Paternal Grandmother    Rectal cancer Neg Hx    Stomach cancer Neg Hx    Esophageal cancer Neg Hx     Past Surgical History:  Procedure Laterality Date   BASAL CELL CARCINOMA EXCISION     ed records 07/26/2020   CERVICAL DISC SURGERY      ROS: Review of Systems Negative except as stated above  PHYSICAL EXAM: BP 119/83   Pulse 90   Ht '5\' 8"'$  (1.727 m)   Wt 258 lb (117 kg)   SpO2 91%   BMI 39.23 kg/m   Physical Exam HENT:     Head: Normocephalic and atraumatic.  Eyes:     Extraocular Movements: Extraocular movements intact.     Conjunctiva/sclera: Conjunctivae normal.     Pupils: Pupils are equal, round, and reactive to light.  Cardiovascular:     Rate and Rhythm: Normal rate and regular rhythm.     Pulses: Normal pulses.      Heart sounds: Normal heart sounds.  Pulmonary:     Effort: Pulmonary effort is normal.     Breath sounds: Normal breath sounds.  Musculoskeletal:     Cervical back: Normal range of motion and neck supple.  Neurological:     General: No focal deficit present.     Mental Status: She is alert and oriented to person, place, and time.  Psychiatric:        Mood and Affect:  Mood normal.        Behavior: Behavior normal.    Results for orders placed or performed in visit on 09/10/22  POCT glycosylated hemoglobin (Hb A1C)  Result Value Ref Range   Hemoglobin A1C 8.5 (A) 4.0 - 5.6 %   HbA1c POC (<> result, manual entry)     HbA1c, POC (prediabetic range)     HbA1c, POC (controlled diabetic range)      ASSESSMENT AND PLAN: 1. Type 2 diabetes mellitus with diabetic polyneuropathy, without long-term current use of insulin (HCC) - Hemoglobin A1c not at goal at 8.5%, goal 7%.  - Increase Tirzepatide from 2.5 mg weekly to 5 mg weekly.  - Patient intolerant to Metformin and does not prefer Semaglutide.  - Discussed the importance of healthy eating habits, low-carbohydrate diet, low-sugar diet, regular aerobic exercise (at least 150 minutes a week as tolerated) and medication compliance to achieve or maintain control of diabetes. - Follow-up with clinical pharmacist in 4 weeks or sooner if needed for diabetes checkup. Write your home blood sugar results down each day and bring those results to your appointment along with your home glucose monitor. Medications may be revised at that time if needed. - Follow-up with primary provider as scheduled.  - POCT glycosylated hemoglobin (Hb A1C) - tirzepatide (MOUNJARO) 5 MG/0.5ML Pen; Inject 5 mg into the skin once a week.  Dispense: 3 mL; Refill: 0  2. Primary hypertension - Continue Olmesartan as prescribed. No refills needed as of present.  - Continue Amlodipine and Hydrochlorothiazide as prescribed.  - Counseled on blood pressure goal of less than  130/80, low-sodium, DASH diet, medication compliance, and 150 minutes of moderate intensity exercise per week as tolerated. Counseled on medication adherence and adverse effects. - Follow-up with primary provider in 3 months or sooner if needed.  - amLODipine (NORVASC) 10 MG tablet; Take 1 tablet (10 mg total) by mouth daily.  Dispense: 30 tablet; Refill: 2 - hydrochlorothiazide (HYDRODIURIL) 25 MG tablet; Take 1 tablet (25 mg total) by mouth daily.  Dispense: 30 tablet; Refill: 2  3. Hyperlipidemia, unspecified hyperlipidemia type - Continue Atorvastatin as prescribed.  - Follow-up with primary provider in 3 months or sooner if needed.  - atorvastatin (LIPITOR) 40 MG tablet; Take 1 tablet (40 mg total) by mouth daily.  Dispense: 30 tablet; Refill: 2   Patient was given the opportunity to ask questions.  Patient verbalized understanding of the plan and was able to repeat key elements of the plan. Patient was given clear instructions to go to Emergency Department or return to medical center if symptoms don't improve, worsen, or new problems develop.The patient verbalized understanding.   Orders Placed This Encounter  Procedures   POCT glycosylated hemoglobin (Hb A1C)    Requested Prescriptions   Signed Prescriptions Disp Refills   tirzepatide (MOUNJARO) 5 MG/0.5ML Pen 3 mL 0    Sig: Inject 5 mg into the skin once a week.   amLODipine (NORVASC) 10 MG tablet 30 tablet 2    Sig: Take 1 tablet (10 mg total) by mouth daily.   hydrochlorothiazide (HYDRODIURIL) 25 MG tablet 30 tablet 2    Sig: Take 1 tablet (25 mg total) by mouth daily.   atorvastatin (LIPITOR) 40 MG tablet 30 tablet 2    Sig: Take 1 tablet (40 mg total) by mouth daily.    Return in about 4 weeks (around 10/08/2022) for Follow-Up or next available diabetes with Acquanetta Belling Ausdall RPH-CPP.  Camillia Herter, NP

## 2022-09-10 ENCOUNTER — Ambulatory Visit (INDEPENDENT_AMBULATORY_CARE_PROVIDER_SITE_OTHER): Payer: BC Managed Care – PPO | Admitting: Family

## 2022-09-10 VITALS — BP 119/83 | HR 90 | Ht 68.0 in | Wt 258.0 lb

## 2022-09-10 DIAGNOSIS — E1142 Type 2 diabetes mellitus with diabetic polyneuropathy: Secondary | ICD-10-CM

## 2022-09-10 DIAGNOSIS — E785 Hyperlipidemia, unspecified: Secondary | ICD-10-CM | POA: Diagnosis not present

## 2022-09-10 DIAGNOSIS — I10 Essential (primary) hypertension: Secondary | ICD-10-CM

## 2022-09-10 LAB — POCT GLYCOSYLATED HEMOGLOBIN (HGB A1C): Hemoglobin A1C: 8.5 % — AB (ref 4.0–5.6)

## 2022-09-10 MED ORDER — ATORVASTATIN CALCIUM 40 MG PO TABS
40.0000 mg | ORAL_TABLET | Freq: Every day | ORAL | 2 refills | Status: DC
  Filled 2022-12-30: qty 30, 30d supply, fill #0

## 2022-09-10 MED ORDER — TIRZEPATIDE 5 MG/0.5ML ~~LOC~~ SOAJ: 5.0000 mg | SUBCUTANEOUS | 0 refills | Status: DC

## 2022-09-10 MED ORDER — AMLODIPINE BESYLATE 10 MG PO TABS: 10.0000 mg | ORAL_TABLET | Freq: Every day | ORAL | 2 refills | Status: AC

## 2022-09-10 MED ORDER — HYDROCHLOROTHIAZIDE 25 MG PO TABS
25.0000 mg | ORAL_TABLET | Freq: Every day | ORAL | 2 refills | Status: AC
  Filled 2022-10-24: qty 30, 30d supply, fill #0

## 2022-09-10 NOTE — Patient Instructions (Signed)
Tirzepatide Injection What is this medication? TIRZEPATIDE (tir ZEP a tide) treats type 2 diabetes. It works by increasing insulin levels in your body, which decreases your blood sugar (glucose). Changes to diet and exercise are often combined with this medication. This medicine may be used for other purposes; ask your health care provider or pharmacist if you have questions. COMMON BRAND NAME(S): MOUNJARO What should I tell my care team before I take this medication? They need to know if you have any of these conditions: Endocrine tumors (MEN 2) or if someone in your family had these tumors Eye disease, vision problems Gallbladder disease History of pancreatitis Kidney disease Stomach or intestine problems Thyroid cancer or if someone in your family had thyroid cancer An unusual or allergic reaction to tirzepatide, other medications, foods, dyes, or preservatives Pregnant or trying to get pregnant Breast-feeding How should I use this medication? This medication is injected under the skin. You will be taught how to prepare and give it. It is given once every week (every 7 days). Keep taking it unless your health care provider tells you to stop. If you use this medication with insulin, you should inject this medication and the insulin separately. Do not mix them together. Do not give the injections right next to each other. Change (rotate) injection sites with each injection. This medication comes with INSTRUCTIONS FOR USE. Ask your pharmacist for directions on how to use this medication. Read the information carefully. Talk to your pharmacist or care team if you have questions. It is important that you put your used needles and syringes in a special sharps container. Do not put them in a trash can. If you do not have a sharps container, call your pharmacist or care team to get one. A special MedGuide will be given to you by the pharmacist with each prescription and refill. Be sure to read this  information carefully each time. Talk to your care team about the use of this medication in children. Special care may be needed. Overdosage: If you think you have taken too much of this medicine contact a poison control center or emergency room at once. NOTE: This medicine is only for you. Do not share this medicine with others. What if I miss a dose? If you miss a dose, take it as soon as you can unless it is more than 4 days (96 hours) late. If it is more than 4 days late, skip the missed dose. Take the next dose at the normal time. Do not take 2 doses within 3 days of each other. What may interact with this medication? Alcohol Antiviral medications for HIV or AIDS Aspirin and aspirin-like medications Beta blockers, such as atenolol, metoprolol, propranolol Certain medications for blood pressure, heart disease, irregular heart beat Chromium Clonidine Diuretics Estrogen or progestin hormones Fenofibrate Gemfibrozil Guanethidine Isoniazid Lanreotide Female hormones or anabolic steroids MAOIs, such as Marplan, Nardil, and Parnate Medications for weight loss Medications for allergies, asthma, cold, or cough Medications for depression, anxiety, or mental health conditions Niacin Nicotine NSAIDs, medications for pain and inflammation, such as ibuprofen or naproxen Octreotide Other medications for diabetes, such as glyburide, glipizide, or glimepiride Pasireotide Pentamidine Phenytoin Probenecid Quinolone antibiotics, such as ciprofloxacin, levofloxacin, ofloxacin Reserpine Some herbal dietary supplements Steroid medications, such as prednisone or cortisone Sulfamethoxazole; trimethoprim Thyroid hormones Warfarin This list may not describe all possible interactions. Give your health care provider a list of all the medicines, herbs, non-prescription drugs, or dietary supplements you use. Also tell them   if you smoke, drink alcohol, or use illegal drugs. Some items may interact with  your medicine. What should I watch for while using this medication? Visit your care team for regular checks on your progress. Drink plenty of fluids while taking this medication. Check with your care team if you get an attack of severe diarrhea, nausea, and vomiting. The loss of too much body fluid can make it dangerous for you to take this medication. A test called the HbA1C (A1C) will be monitored. This is a simple blood test. It measures your blood sugar control over the last 2 to 3 months. You will receive this test every 3 to 6 months. Learn how to check your blood sugar. Learn the symptoms of low and high blood sugar and how to manage them. Always carry a quick-source of sugar with you in case you have symptoms of low blood sugar. Examples include hard sugar candy or glucose tablets. Make sure others know that you can choke if you eat or drink when you develop serious symptoms of low blood sugar, such as seizures or unconsciousness. They must get medical help at once. Tell your care team if you have high blood sugar. You might need to change the dose of your medication. If you are sick or exercising more than usual, you might need to change the dose of your medication. Do not skip meals. Ask your care team if you should avoid alcohol. Many nonprescription cough and cold products contain sugar or alcohol. These can affect blood sugar. Pens should never be shared. Even if the needle is changed, sharing may result in passing of viruses like hepatitis or HIV. Wear a medical ID bracelet or chain, and carry a card that describes your disease and details of your medication and dosage times. Birth control may not work properly while you are taking this medication. If you take birth control pills by mouth, your care team may recommend another type of birth control for 4 weeks after you start this medication and for 4 weeks after each increase in your dose of this medication. Ask your care team which birth  control methods you should use. What side effects may I notice from receiving this medication? Side effects that you should report to your care team as soon as possible: Allergic reactions or angioedema--skin rash, itching or hives, swelling of the face, eyes, lips, tongue, arms, or legs, trouble swallowing or breathing Change in vision Dehydration--increased thirst, dry mouth, feeling faint or lightheaded, headache, dark yellow or brown urine Gallbladder problems--severe stomach pain, nausea, vomiting, fever Kidney injury--decrease in the amount of urine, swelling of the ankles, hands, or feet Pancreatitis--severe stomach pain that spreads to your back or gets worse after eating or when touched, fever, nausea, vomiting Thyroid cancer--new mass or lump in the neck, pain or trouble swallowing, trouble breathing, hoarseness Side effects that usually do not require medical attention (report these to your care team if they continue or are bothersome): Constipation Diarrhea Loss of appetite Nausea Stomach pain Upset stomach Vomiting This list may not describe all possible side effects. Call your doctor for medical advice about side effects. You may report side effects to FDA at 1-800-FDA-1088. Where should I keep my medication? Keep out of the reach of children and pets. Refrigeration (preferred): Store in the refrigerator. Keep this medication in the original carton until you are ready to take it. Do not freeze. Protect from light. Get rid of opened vials after use, even if there is medication   left. Get rid of any unopened vials or pens after the expiration date. Room Temperature: This medication may be stored at room temperature below 30 degrees C (86 degrees F) for up to 21 days. Keep this medication in the original carton until you are ready to take it. Protect from light. Avoid exposure to extreme heat. Get rid of opened vials after use, even if there is medication left. Get rid of any unopened  vials or pens after 21 days, or after they expire, whichever is first. To get rid of medications that are no longer needed or have expired: Take the medication to a medication take-back program. Check with your pharmacy or law enforcement to find a location. If you cannot return the medication, ask your pharmacist or care team how to get rid of this medication safely. NOTE: This sheet is a summary. It may not cover all possible information. If you have questions about this medicine, talk to your doctor, pharmacist, or health care provider.  2023 Elsevier/Gold Standard (2020-12-26 00:00:00)  

## 2022-09-11 ENCOUNTER — Other Ambulatory Visit: Payer: Self-pay

## 2022-09-11 ENCOUNTER — Encounter: Payer: Self-pay | Admitting: Family

## 2022-09-11 DIAGNOSIS — E1142 Type 2 diabetes mellitus with diabetic polyneuropathy: Secondary | ICD-10-CM

## 2022-09-11 DIAGNOSIS — E119 Type 2 diabetes mellitus without complications: Secondary | ICD-10-CM

## 2022-09-11 MED ORDER — FREESTYLE LIBRE 2 SENSOR MISC: 1.0000 | Freq: Three times a day (TID) | Status: DC

## 2022-09-11 MED ORDER — FREESTYLE LIBRE 2 SENSOR MISC: 1.0000 | Freq: Three times a day (TID) | 1 refills | Status: AC

## 2022-09-11 MED ORDER — FREESTYLE LIBRE READER DEVI: 1.0000 | Freq: Once | 0 refills | Status: AC

## 2022-09-11 NOTE — Telephone Encounter (Signed)
Freestyle Libre sent into [facility]

## 2022-09-29 ENCOUNTER — Ambulatory Visit: Payer: BC Managed Care – PPO | Attending: Family | Admitting: Pharmacist

## 2022-09-29 DIAGNOSIS — E119 Type 2 diabetes mellitus without complications: Secondary | ICD-10-CM

## 2022-09-29 MED ORDER — TIRZEPATIDE 7.5 MG/0.5ML ~~LOC~~ SOAJ: 7.5000 mg | SUBCUTANEOUS | 0 refills | Status: DC

## 2022-09-29 NOTE — Progress Notes (Signed)
    S:    No chief complaint on file.  47 y.o. female who presents for diabetes evaluation, education, and management. Patient was referred and last seen by Primary Care Provider, Durene Fruits, NP, on 09/10/2022.   At last visit, Mounjaro was increased to 5 mg weekly.   Today, patient arrives in good spirits and presents without any assistance. States that the effects of Mounjaro have been somewhat minimal. She has started working out and started intermediate fasting. She has tolerate Mounjaro 5 mg weekly without GI side effects. Attempted to see if patient would qualify for LIBERATE CGM study, A1c today 7.9%, just below cut off of >8%.  Current diabetes medications include: Mounajro 5 mg weekly (Fridays)  Patient reports adherence to taking all medications as prescribed.   Insurance coverage: BCBS  Patient denies hypoglycemic events.  Patient-reported dietary and exercise habits: She has started working out and started intermediate fasting   O:  7 day average blood glucose: no meter at visit    Lab Results  Component Value Date   HGBA1C 8.5 (A) 09/10/2022   There were no vitals filed for this visit.  Lipid Panel     Component Value Date/Time   CHOL 217 (H) 10/22/2021 0929   TRIG 220 (H) 10/22/2021 0929   HDL 44 10/22/2021 0929   CHOLHDL 4.9 (H) 10/22/2021 0929   LDLCALC 134 (H) 10/22/2021 0929    Clinical Atherosclerotic Cardiovascular Disease (ASCVD): No  The 10-year ASCVD risk score (Arnett DK, et al., 2019) is: 13%   Values used to calculate the score:     Age: 41 years     Sex: Female     Is Non-Hispanic African American: Yes     Diabetic: Yes     Tobacco smoker: Yes     Systolic Blood Pressure: 123456 mmHg     Is BP treated: Yes     HDL Cholesterol: 44 mg/dL     Total Cholesterol: 217 mg/dL    A/P: Diabetes longstanding, currently close to goal based on A1c (7.9%). Patient is able to verbalize appropriate hypoglycemia management plan. Medication adherence  appears appropriate. Unable to enroll patient in LIBERATE CGM study as A1c was not >8%.  -Increased dose of GLP-1/GIP Mounjaro (tirzepatide) to 7.5 mg weekly -Intolerant to metformin.  -Patient educated on purpose, proper use, and potential adverse effects of Mounjaro.  -Extensively discussed pathophysiology of diabetes, recommended lifestyle interventions, dietary effects on blood sugar control.  -Counseled on s/sx of and management of hypoglycemia.  -Next A1c anticipated 3-6 months.   Written patient instructions provided. Patient verbalized understanding of treatment plan.  Total time in face to face counseling 30 minutes.    Follow-up:  Pharmacist March 2024. PCP clinic visit on 10/08/2022.   Joseph Art, Pharm.D. PGY-2 Ambulatory Care Pharmacy Resident 09/29/2022 3:48 PM

## 2022-10-01 NOTE — Progress Notes (Deleted)
Patient ID: Sheri [name], female    DOB: 08-31-1975  MRN: [id number]  CC: Diabetes Follow-Up  Subjective: Sheri [name] is a 47 y.o. female who presents for diabetes follow-up.   Her concerns today include:  09/29/2022 [place] per RPH-CPP note: A/P: Diabetes longstanding, currently close to goal based on A1c (7.9%). Patient is able to verbalize appropriate hypoglycemia management plan. Medication adherence appears appropriate. Unable to enroll patient in LIBERATE CGM study as A1c was not >8%.  -Increased dose of GLP-1/GIP Mounjaro (tirzepatide) to 7.5 mg weekly -Intolerant to metformin.  -Patient educated on purpose, proper use, and potential adverse effects of Mounjaro.  -Extensively discussed pathophysiology of diabetes, recommended lifestyle interventions, dietary effects on blood sugar control.  -Counseled on s/sx of and management of hypoglycemia.  -Next A1c anticipated 3-6 months.    Written patient instructions provided. Patient verbalized understanding of treatment plan.  Total time in face to face counseling 30 minutes.     Follow-up:  Pharmacist March 2024. PCP clinic visit on 10/08/2022.   Today's visit 10/08/2022:  Patient Active Problem List   Diagnosis Date Noted   Shoulder instability, left 04/30/2022   Papanicolaou smear of cervix with positive high risk human papilloma virus (HPV) test 04/18/2022   Bacterial vaginosis 04/03/2022   Encounter for annual routine gynecological examination 04/03/2022   Menopause 04/03/2022   Vaginal discharge 04/03/2022   Superior glenoid labrum lesion of left shoulder 03/31/2022   Severe obesity (BMI 35.0-39.9) with comorbidity ([place]) 09/11/2021   Meralgia paresthetica 03/08/2021   Neuropathy 01/02/2021   Bilateral sciatica 01/02/2021   Upper airway cough syndrome 11/08/2020   Cigarette smoker 11/08/2020   Type 2 diabetes mellitus ([place]) 10/18/2020   Hyperlipidemia 10/18/2020   Rectal  bleeding 09/27/2020   Essential hypertension 09/27/2020   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 8 g 1   Alcohol Swabs (ALCOHOL PREP) 70 % PADS One injection weekly. 100 each 1   amLODipine (NORVASC) 10 MG tablet Take 1 tablet (10 mg total) by mouth daily. 30 tablet 2   atorvastatin (LIPITOR) 40 MG tablet Take 1 tablet (40 mg total) by mouth daily. 30 tablet 2   Blood Glucose Monitoring Suppl (TRUE METRIX METER) w/Device KIT Use as directed 1 kit 0   buPROPion (WELLBUTRIN SR) 150 MG 12 hr tablet Take 1 tablet (150 mg total) by mouth 2 (two) times daily. 180 tablet 0   cetirizine (ZYRTEC) 10 MG tablet Take 10 mg by mouth daily.     Continuous Blood Gluc Sensor (FREESTYLE LIBRE 2 SENSOR) MISC 1 each by Does not apply route 4 (four) times daily -  before meals and at bedtime. 2 each 1   DULoxetine (CYMBALTA) 60 MG capsule Take 1 capsule (60 mg total) by mouth daily. 90 capsule 3   famotidine (PEPCID) 20 MG tablet One after supper 30 tablet 11   glucose blood (TRUE METRIX BLOOD GLUCOSE TEST) test strip Use as instructed 100 each 12   hydrochlorothiazide (HYDRODIURIL) 25 MG tablet Take 1 tablet (25 mg total) by mouth daily. 30 tablet 2   influenza vac split quadrivalent PF (FLUARIX) 0.5 ML injection Inject into the  muscle. 0.5 mL 0   Insulin Pen Needle (BD PEN NEEDLE MICRO U/F) 32G X 6 MM MISC One injection weekly. 100 each 0   nicotine (NICODERM CQ - DOSED IN MG/24 HOURS) 14 mg/24hr patch Place 1 patch onto the skin daily. 28 patch 2   olmesartan (BENICAR) 40 MG tablet Take 1 tablet (40 mg total) by mouth daily. 30 tablet 2   pantoprazole (PROTONIX) 40 MG tablet Take 1 tablet (40 mg total) by mouth  daily. 90 tablet 0   tirzepatide (MOUNJARO) 7.5 MG/0.5ML Pen Inject 7.5 mg into the skin once a week. 2 mL 0   traZODone (DESYREL) 100 MG tablet Take 1 tablet (100 mg total) by mouth at bedtime. 30 tablet 1   TRUEplus Lancets 28G MISC Use as directed 100 each 4   Current Facility-Administered Medications on File Prior to Visit  Medication Dose Route Frequency Provider Last Rate Last Admin   hydrALAZINE (APRESOLINE) tablet 25 mg  25 mg Oral Once Camillia Herter, NP        Allergies  Allergen Reactions   Amoxicillin Hives and Rash   Iodine Anaphylaxis and Swelling   Penicillins Hives, Itching and Rash   Shellfish-Derived Products Anaphylaxis   Erythromycin Swelling and Other (See Comments)   Metformin Diarrhea    Social History   Socioeconomic History   Marital status: Single    Spouse name: Not on file   Number of children: Not on file   Years of education: Not on file   Highest education level: High school graduate  Occupational History   Not on file  Tobacco Use   Smoking status: Some Days    Packs/day: 0.50    Years: 22.00    Total pack years: 11.00    Types: Cigarettes    Passive exposure: Current   Smokeless tobacco: Never   Tobacco comments:    4-5 cigs per day 11/08/20//lmr  Vaping Use   Vaping Use: Some days  Substance and Sexual Activity   Alcohol use: Yes    Comment: occasional   Drug use: Not Currently   Sexual activity: Never    Birth control/protection: None  Other Topics Concern   Not on file  Social History Narrative   ** Merged History Encounter **    Lives alone   Right handed   Caffeine: coffee every morning and tea.    Social Determinants of Health   Financial Resource Strain: Not on file  Food Insecurity: Not on file  Transportation Needs: Not on file  Physical Activity: Not on file  Stress: Not on file  Social Connections: Not on file  Intimate Partner Violence: Not on file    Family History  Problem Relation Age of Onset    Hypertension Mother    Diabetes Mother    Diabetes Father    Hypertension Father    Colon cancer Maternal Grandfather    Diabetes Paternal Grandmother    Rectal cancer Neg Hx    Stomach cancer Neg Hx    Esophageal cancer Neg Hx     Past Surgical History:  Procedure Laterality Date   BASAL CELL CARCINOMA EXCISION     ed records 07/26/2020   CERVICAL DISC SURGERY      ROS: Review of Systems Negative except as stated above  PHYSICAL EXAM: There were no vitals taken for this visit.  Physical Exam  {female adult master:310786} {female adult master:310785}     Latest Ref Rng & Units 02/24/2022  9:26 AM 02/13/2022   11:56 AM 01/30/2022    8:48 AM  CMP  Glucose 70 - 99 mg/dL 145  127  134   BUN 6 - 20 mg/dL 17  13  9   $ Creatinine 0.44 - 1.00 mg/dL 0.75  0.75  0.80   Sodium 135 - 145 mmol/L 142  143  144   Potassium 3.5 - 5.1 mmol/L 3.4  3.9  4.0   Chloride 98 - 111 mmol/L 105  105  105   CO2 22 - 32 mmol/L 30  24  24   $ Calcium 8.9 - 10.3 mg/dL 9.3  9.8  9.4    Lipid Panel     Component Value Date/Time   CHOL 217 (H) 10/22/2021 0929   TRIG 220 (H) 10/22/2021 0929   HDL 44 10/22/2021 0929   CHOLHDL 4.9 (H) 10/22/2021 0929   LDLCALC 134 (H) 10/22/2021 0929    CBC    Component Value Date/Time   WBC 11.8 (H) 02/24/2022 0926   RBC 4.48 02/24/2022 0926   HGB 12.7 02/24/2022 0926   HGB 14.7 10/17/2020 1151   HCT 38.6 02/24/2022 0926   HCT 43.8 10/17/2020 1151   PLT 266 02/24/2022 0926   PLT 304 10/17/2020 1151   MCV 86.2 02/24/2022 0926   MCV 86 10/17/2020 1151   MCH 28.3 02/24/2022 0926   MCHC 32.9 02/24/2022 0926   RDW 13.4 02/24/2022 0926   RDW 13.1 10/17/2020 1151   LYMPHSABS 3.6 02/24/2022 0926   MONOABS 0.7 02/24/2022 0926   EOSABS 0.2 02/24/2022 0926   BASOSABS 0.0 02/24/2022 0926    ASSESSMENT AND PLAN:  There are no diagnoses linked to this encounter.   Patient was given the opportunity to ask questions.  Patient verbalized understanding of the  plan and was able to repeat key elements of the plan. Patient was given clear instructions to go to Emergency Department or return to medical center if symptoms don't improve, worsen, or new problems develop.The patient verbalized understanding.   No orders of the defined types were placed in this encounter.    Requested Prescriptions    No prescriptions requested or ordered in this encounter    No follow-ups on file.  Camillia Herter, NP

## 2022-10-08 ENCOUNTER — Ambulatory Visit: Payer: BC Managed Care – PPO | Admitting: Family

## 2022-10-08 DIAGNOSIS — E1165 Type 2 diabetes mellitus with hyperglycemia: Secondary | ICD-10-CM

## 2022-10-18 ENCOUNTER — Other Ambulatory Visit: Payer: Self-pay | Admitting: Family Medicine

## 2022-10-18 DIAGNOSIS — E119 Type 2 diabetes mellitus without complications: Secondary | ICD-10-CM

## 2022-10-20 NOTE — Telephone Encounter (Signed)
Requested medication (s) are due for refill today: yes  Requested medication (s) are on the active medication list: yes  Last refill:  09/29/22  Future visit scheduled: yes  Notes to clinic:  Unable to refill per protocol, [facility]. Routing for approval.     Requested Prescriptions  Pending Prescriptions Disp Refills   MOUNJARO 7.5 MG/0.5ML Pen [Pharmacy Med Name: MOUNJARO 7.5 MG/0.5 ML PEN[$R]] 2 mL 0    Sig: INJECT THE CONTENTS OF ONE PEN UNDER THE SKIN WEEKLY ON THE SAME DAY EACH WEEK     Off-Protocol Failed - 10/18/2022  3:41 PM      Failed - Medication not assigned to a protocol, review manually.      Passed - Valid encounter within last 12 months    Recent Outpatient Visits           3 weeks ago Type 2 diabetes mellitus without complication, unspecified whether long term insulin use ([place])   [place], [place] L, RPH-CPP   1 month ago Type 2 diabetes mellitus with diabetic polyneuropathy, without long-term current use of insulin ([place])   [place] [facility] [place], NP   2 months ago Primary hypertension   [place] [facility], [place], NP   3 months ago Primary hypertension   [facility] [facility], Amy J, NP   8 months ago Essential (primary) hypertension    [facility] at [facility], [place], NP       Future Appointments             In 2 weeks Daisy Blossom, Jarome Matin, [place]   In 2 weeks Camillia Herter, NP [facility]

## 2022-10-21 NOTE — Telephone Encounter (Signed)
Order complete. 

## 2022-10-22 ENCOUNTER — Other Ambulatory Visit: Payer: Self-pay | Admitting: Family Medicine

## 2022-10-22 ENCOUNTER — Other Ambulatory Visit: Payer: Self-pay | Admitting: Family

## 2022-10-22 ENCOUNTER — Other Ambulatory Visit (HOSPITAL_BASED_OUTPATIENT_CLINIC_OR_DEPARTMENT_OTHER): Payer: Self-pay

## 2022-10-22 DIAGNOSIS — I10 Essential (primary) hypertension: Secondary | ICD-10-CM

## 2022-10-22 DIAGNOSIS — E119 Type 2 diabetes mellitus without complications: Secondary | ICD-10-CM

## 2022-10-22 MED ORDER — OLMESARTAN MEDOXOMIL 40 MG PO TABS
40.0000 mg | ORAL_TABLET | Freq: Every day | ORAL | 2 refills | Status: AC
  Filled 2022-10-24: qty 30, 30d supply, fill #0

## 2022-10-23 ENCOUNTER — Other Ambulatory Visit (HOSPITAL_BASED_OUTPATIENT_CLINIC_OR_DEPARTMENT_OTHER): Payer: Self-pay

## 2022-10-23 MED ORDER — ATORVASTATIN CALCIUM 40 MG PO TABS: 40.0000 mg | ORAL_TABLET | Freq: Every day | ORAL | 2 refills | Status: DC

## 2022-10-23 MED ORDER — IBUPROFEN 800 MG PO TABS: 800.0000 mg | ORAL_TABLET | Freq: Four times a day (QID) | ORAL | 1 refills | Status: AC | PRN

## 2022-10-23 MED ORDER — ATORVASTATIN CALCIUM 40 MG PO TABS
40.0000 mg | ORAL_TABLET | Freq: Every day | ORAL | 1 refills | Status: DC
  Filled 2022-10-23: qty 90, 90d supply, fill #0

## 2022-10-23 MED ORDER — MOUNJARO 7.5 MG/0.5ML ~~LOC~~ SOAJ: 7.5000 mg | SUBCUTANEOUS | 0 refills | Status: DC

## 2022-10-23 MED ORDER — HYDROCHLOROTHIAZIDE 25 MG PO TABS: 25.0000 mg | ORAL_TABLET | Freq: Every day | ORAL | 0 refills | Status: DC

## 2022-10-23 MED ORDER — OLMESARTAN MEDOXOMIL 40 MG PO TABS: 40.0000 mg | ORAL_TABLET | Freq: Every day | ORAL | 0 refills | Status: DC

## 2022-10-23 MED ORDER — IBUPROFEN 800 MG PO TABS
800.0000 mg | ORAL_TABLET | Freq: Three times a day (TID) | ORAL | 0 refills | Status: AC | PRN
  Filled 2022-10-23: qty 30, 10d supply, fill #0

## 2022-10-23 MED ORDER — HYDROCHLOROTHIAZIDE 25 MG PO TABS: 25.0000 mg | ORAL_TABLET | Freq: Every day | ORAL | 2 refills | Status: DC

## 2022-10-23 MED ORDER — AMLODIPINE BESYLATE 10 MG PO TABS
10.0000 mg | ORAL_TABLET | Freq: Every day | ORAL | 2 refills | Status: DC
  Filled 2022-10-24: qty 30, 30d supply, fill #0

## 2022-10-23 MED ORDER — HYDROCHLOROTHIAZIDE 12.5 MG PO TABS: 12.5000 mg | ORAL_TABLET | Freq: Every day | ORAL | 0 refills | Status: DC

## 2022-10-23 MED ORDER — MOUNJARO 7.5 MG/0.5ML ~~LOC~~ SOAJ
7.5000 mg | SUBCUTANEOUS | 2 refills | Status: DC
  Filled 2022-10-23: qty 2, 28d supply, fill #0

## 2022-10-23 MED ORDER — OLMESARTAN MEDOXOMIL 40 MG PO TABS: 40.0000 mg | ORAL_TABLET | Freq: Every day | ORAL | 2 refills | Status: DC

## 2022-10-23 MED ORDER — AMLODIPINE BESYLATE 10 MG PO TABS
10.0000 mg | ORAL_TABLET | Freq: Every day | ORAL | 0 refills | Status: DC
  Filled 2022-12-30: qty 90, 90d supply, fill #0

## 2022-10-23 NOTE — Telephone Encounter (Signed)
Att to return pt call no ans lvm adv that Darcel Bayley was refilled on 03/12 and  Benicar was refilled on 03/13 please check with pharmacy

## 2022-10-23 NOTE — Telephone Encounter (Unsigned)
Copied from [place] [phone]. Topic: General - Other >> Oct 22, 2022  4:33 PM Leone Payor F wrote: Reason for CRM: Pt is calling in requesting to speak with Amy or Eboney. Pt says it is regarding her medications. Please follow up with pt.

## 2022-10-24 ENCOUNTER — Other Ambulatory Visit (HOSPITAL_BASED_OUTPATIENT_CLINIC_OR_DEPARTMENT_OTHER): Payer: Self-pay

## 2022-10-27 ENCOUNTER — Other Ambulatory Visit (HOSPITAL_BASED_OUTPATIENT_CLINIC_OR_DEPARTMENT_OTHER): Payer: Self-pay

## 2022-10-31 ENCOUNTER — Other Ambulatory Visit (HOSPITAL_BASED_OUTPATIENT_CLINIC_OR_DEPARTMENT_OTHER): Payer: Self-pay

## 2022-10-31 ENCOUNTER — Telehealth: Payer: BC Managed Care – PPO | Admitting: Family Medicine

## 2022-10-31 DIAGNOSIS — J3089 Other allergic rhinitis: Secondary | ICD-10-CM

## 2022-10-31 DIAGNOSIS — H019 Unspecified inflammation of eyelid: Secondary | ICD-10-CM | POA: Diagnosis not present

## 2022-10-31 MED ORDER — CETIRIZINE HCL 10 MG PO TABS
10.0000 mg | ORAL_TABLET | Freq: Every day | ORAL | 0 refills | Status: AC
  Filled 2022-10-31: qty 100, 100d supply, fill #0

## 2022-10-31 MED ORDER — BACITRACIN-POLYMYXIN B 500-10000 UNIT/GM OP OINT
1.0000 | TOPICAL_OINTMENT | Freq: Three times a day (TID) | OPHTHALMIC | 0 refills | Status: AC
  Filled 2022-10-31: qty 3.5, 4d supply, fill #0

## 2022-10-31 NOTE — Progress Notes (Signed)
Virtual Visit Consent   Sheri [name], you are scheduled for a virtual visit with a [facility] provider today. Just as with appointments in the office, your consent must be obtained to participate. Your consent will be active for this visit and any virtual visit you may have with one of our providers in the next 365 days. If you have a MyChart account, a copy of this consent can be sent to you electronically.  As this is a virtual visit, video technology does not allow for your provider to perform a traditional examination. This may limit your provider's ability to fully assess your condition. If your provider identifies any concerns that need to be evaluated in person or the need to arrange testing (such as labs, EKG, etc.), we will make arrangements to do so. Although advances in technology are sophisticated, we cannot ensure that it will always work on either your end or our end. If the connection with a video visit is poor, the visit may have to be switched to a telephone visit. With either a video or telephone visit, we are not always able to ensure that we have a secure connection.  By engaging in this virtual visit, you consent to the provision of healthcare and authorize for your insurance to be billed (if applicable) for the services provided during this visit. Depending on your insurance coverage, you may receive a charge related to this service.  I need to obtain your verbal consent now. Are you willing to proceed with your visit today? Sheri [name] has provided verbal consent on 10/31/2022 for a virtual visit (video or telephone). Sheri Mayo, NP  Date: 10/31/2022 10:30 AM  Virtual Visit via Video Note   I, Sheri [name], connected with  Sheri [name]  ([id number], 47 years old) on 10/31/22 at 10:30 AM EDT by a video-enabled telemedicine application and verified that I am speaking with the correct person using two identifiers.  Location: Patient: Virtual Visit Location  Patient: Home Provider: Virtual Visit Location Provider: Home   I discussed the limitations of evaluation and management by telemedicine and the availability of in person appointments. The patient expressed understanding and agreed to proceed.    History of Present Illness: Sheri [name] is a 47 y.o. who identifies as a female who was assigned female at birth, and is being seen today for left eye redness.  HPI: Conjunctivitis  The current episode started yesterday. The onset was gradual (after going to the park). The problem has been gradually worsening. The problem is moderate. Relieved by: warm compress on it. Associated symptoms include photophobia, eye discharge and eye redness. Pertinent negatives include no decreased vision, no double vision, no congestion, no ear discharge, no ear pain, no headaches, no hearing loss, no mouth sores, no rhinorrhea, no sore throat, no stridor, no swollen glands and no eye pain. The eye pain is moderate. The left eye is affected. The eye pain is not associated with movement. The eyelid exhibits redness.    Problems:  Patient Active Problem List   Diagnosis Date Noted   Shoulder instability, left 04/30/2022   Papanicolaou smear of cervix with positive high risk human papilloma virus (HPV) test 04/18/2022   Bacterial vaginosis 04/03/2022   Encounter for annual routine gynecological examination 04/03/2022   Menopause 04/03/2022   Vaginal discharge 04/03/2022   Superior glenoid labrum lesion of left shoulder 03/31/2022   Severe obesity (BMI 35.0-39.9) with comorbidity ([place]) 09/11/2021   Meralgia paresthetica 03/08/2021   Neuropathy  01/02/2021   Bilateral sciatica 01/02/2021   Upper airway cough syndrome 11/08/2020   Cigarette smoker 11/08/2020   Type 2 diabetes mellitus ([place]) 10/18/2020   Hyperlipidemia 10/18/2020   Rectal bleeding 09/27/2020   Essential hypertension 09/27/2020   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   Biceps tendinitis of left  upper extremity 03/03/2019    Allergies:  Allergies  Allergen Reactions   Amoxicillin Hives and Rash   Iodine Anaphylaxis and Swelling   Penicillins Hives, Itching and Rash   Shellfish-Derived Products Anaphylaxis   Erythromycin Swelling and Other (See Comments)   Metformin Diarrhea   Medications:  Current Outpatient Medications:    albuterol (VENTOLIN HFA) 108 (90 Base) MCG/ACT inhaler, Inhale 2 puffs into the lungs every 6 (six) hours as needed for wheezing or shortness of breath., Disp: 6.7 g, Rfl: 1   Alcohol Swabs (ALCOHOL PREP) 70 % PADS, One injection weekly., Disp: 100 each, Rfl: 1   amLODipine (NORVASC) 10 MG tablet, Take 1 tablet (10 mg total) by mouth daily., Disp: 30 tablet, Rfl: 2   amLODipine (NORVASC) 10 MG tablet, Take 1 tablet (10 mg total) by mouth daily., Disp: 90 tablet, Rfl: 0   amLODipine (NORVASC) 10 MG tablet, Take 1 tablet (10 mg total) by mouth daily., Disp: 30 tablet, Rfl: 2   atorvastatin (LIPITOR) 40 MG tablet, Take 1 tablet (40 mg total) by mouth daily., Disp: 30 tablet, Rfl: 2   atorvastatin (LIPITOR) 40 MG tablet, Take 1 tablet (40 mg total) by mouth daily., Disp: 90 tablet, Rfl: 1   atorvastatin (LIPITOR) 40 MG tablet, Take 1 tablet (40 mg total) by mouth daily., Disp: 30 tablet, Rfl: 2   Blood Glucose Monitoring Suppl (TRUE METRIX METER) w/Device KIT, Use as directed, Disp: 1 kit, Rfl: 0   buPROPion (WELLBUTRIN SR) 150 MG 12 hr tablet, Take 1 tablet (150 mg total) by mouth 2 (two) times daily., Disp: 180 tablet, Rfl: 0   cetirizine (ZYRTEC) 10 MG tablet, Take 10 mg by mouth daily., Disp: , Rfl:    Continuous Blood Gluc Sensor (FREESTYLE LIBRE 2 SENSOR) MISC, 1 each by Does not apply route 4 (four) times daily -  before meals and at bedtime., Disp: 2 each, Rfl: 1   DULoxetine (CYMBALTA) 60 MG capsule, Take 1 capsule (60 mg total) by mouth daily., Disp: 90 capsule, Rfl: 3   famotidine (PEPCID) 20 MG tablet, One after supper, Disp: 30 tablet, Rfl: 11    glucose blood (TRUE METRIX BLOOD GLUCOSE TEST) test strip, Use as instructed, Disp: 100 each, Rfl: 12   hydrochlorothiazide (HYDRODIURIL) 12.5 MG tablet, Take 1 tablet (12.5 mg total) by mouth daily., Disp: 90 tablet, Rfl: 0   hydrochlorothiazide (HYDRODIURIL) 25 MG tablet, Take 1 tablet (25 mg total) by mouth daily., Disp: 30 tablet, Rfl: 2   hydrochlorothiazide (HYDRODIURIL) 25 MG tablet, Take 1 tablet (25 mg total) by mouth daily., Disp: 90 tablet, Rfl: 0   hydrochlorothiazide (HYDRODIURIL) 25 MG tablet, Take 1 tablet (25 mg total) by mouth daily., Disp: 30 tablet, Rfl: 2   ibuprofen (ADVIL) 800 MG tablet, Take 1 tablet (800 mg total) by mouth every 8 (eight) hours as needed., Disp: 30 tablet, Rfl: 0   ibuprofen (ADVIL) 800 MG tablet, Take 1 tablet (800 mg total) by mouth every 6 (six) hours as needed., Disp: 90 tablet, Rfl: 1   influenza vac split quadrivalent PF (FLUARIX) 0.5 ML injection, Inject into the muscle., Disp: 0.5 mL, Rfl: 0   Insulin Pen Needle (  BD PEN NEEDLE MICRO U/F) 32G X 6 MM MISC, Use for one injection weekly as directed., Disp: 100 each, Rfl: 0   nicotine (NICODERM CQ - DOSED IN MG/24 HOURS) 14 mg/24hr patch, Place 1 patch onto the skin daily., Disp: 28 patch, Rfl: 2   olmesartan (BENICAR) 40 MG tablet, Take 1 tablet (40 mg total) by mouth daily., Disp: 30 tablet, Rfl: 2   olmesartan (BENICAR) 40 MG tablet, Take 1 tablet (40 mg total) by mouth daily., Disp: 90 tablet, Rfl: 0   olmesartan (BENICAR) 40 MG tablet, Take 1 tablet (40 mg total) by mouth daily., Disp: 30 tablet, Rfl: 2   pantoprazole (PROTONIX) 40 MG tablet, Take 1 tablet (40 mg total) by mouth daily., Disp: 90 tablet, Rfl: 0   tirzepatide (MOUNJARO) 7.5 MG/0.5ML Pen, Inject 7.5 mg into the skin once a week., Disp: 2 mL, Rfl: 2   tirzepatide (MOUNJARO) 7.5 MG/0.5ML Pen, Inject 7.5 mg into the skin once a week., Disp: 2 mL, Rfl: 0   traZODone (DESYREL) 100 MG tablet, Take 1 tablet (100 mg total) by mouth at bedtime.,  Disp: 30 tablet, Rfl: 1   TRUEplus Lancets 28G MISC, Use as directed, Disp: 100 each, Rfl: 4  Current Facility-Administered Medications:    hydrALAZINE (APRESOLINE) tablet 25 mg, 25 mg, Oral, Once, Minette Brine, Amy J, NP  Observations/Objective: Patient is well-developed, well-nourished in no acute distress.  Resting comfortably  at home.  Head is normocephalic, atraumatic.  No labored breathing.  Speech is clear and coherent with logical content.  Patient is alert and oriented at baseline.  Left eye lower eye has some mild redness and swelling- with 2-3 (1-66mm) bumps under the lid.  Eye is injected in the lower and lateral aspects   Assessment and Plan:  1. Infected eye lid  - bacitracin-polymyxin b (POLYSPORIN) ophthalmic ointment; Place 1 Application into the left eye 3 (three) times daily. Apply up to three times daily- with one time being at bedtime.  Dispense: 3.5 g; Refill: 0  -suspect a eye lid infection vs allergic dermatitis given she was outside prior to this starting up -warm compress, and baby shampoo eye washing prior to application of ointment  -given the location will do above as she is allergic to erythromycin Advised on in person strict precautions.  Reviewed side effects, risks and benefits of medication.    Patient acknowledged agreement and understanding of the plan.   Past Medical, Surgical, Social History, Allergies, and Medications have been Reviewed.   Follow Up Instructions: I discussed the assessment and treatment plan with the patient. The patient was provided an opportunity to ask questions and all were answered. The patient agreed with the plan and demonstrated an understanding of the instructions.  A copy of instructions were sent to the patient via MyChart unless otherwise noted below.    The patient was advised to call back or seek an in-person evaluation if the symptoms worsen or if the condition fails to improve as anticipated.  Time:  I  spent 10 minutes with the patient via telehealth technology discussing the above problems/concerns.    Sheri Mayo, NP

## 2022-10-31 NOTE — Patient Instructions (Addendum)
Ennis Forts Buikema, thank you for joining [name], NP for today's virtual visit.  While this provider is not your primary care provider (PCP), if your PCP is located in our provider database this encounter information will be shared with them immediately following your visit.   [facility]ager account gives you access to today's visit and all your visits, tests, and labs performed at [facility] " click here if you don't have a [place] account or go to [email or web address]  Consent: (Patient) Sheri [name] provided verbal consent for this virtual visit at the beginning of the encounter.  Current Medications:  Current Outpatient Medications:    bacitracin-polymyxin b (POLYSPORIN) ophthalmic ointment, Place 1 Application into the left eye 3 (three) times daily. Apply up to three times daily- with one time being at bedtime., Disp: 3.5 g, Rfl: 0   cetirizine (ZYRTEC ALLERGY) 10 MG tablet, Take 1 tablet (10 mg total) by mouth daily., Disp: 30 tablet, Rfl: 0   albuterol (VENTOLIN HFA) 108 (90 Base) MCG/ACT inhaler, Inhale 2 puffs into the lungs every 6 (six) hours as needed for wheezing or shortness of breath., Disp: 6.7 g, Rfl: 1   Alcohol Swabs (ALCOHOL PREP) 70 % PADS, One injection weekly., Disp: 100 each, Rfl: 1   amLODipine (NORVASC) 10 MG tablet, Take 1 tablet (10 mg total) by mouth daily., Disp: 30 tablet, Rfl: 2   amLODipine (NORVASC) 10 MG tablet, Take 1 tablet (10 mg total) by mouth daily., Disp: 90 tablet, Rfl: 0   amLODipine (NORVASC) 10 MG tablet, Take 1 tablet (10 mg total) by mouth daily., Disp: 30 tablet, Rfl: 2   atorvastatin (LIPITOR) 40 MG tablet, Take 1 tablet (40 mg total) by mouth daily., Disp: 30 tablet, Rfl: 2   atorvastatin (LIPITOR) 40 MG tablet, Take 1 tablet (40 mg total) by mouth daily., Disp: 90 tablet, Rfl: 1   atorvastatin (LIPITOR) 40 MG tablet, Take 1 tablet (40 mg total) by mouth daily., Disp: 30 tablet, Rfl: 2   Blood  Glucose Monitoring Suppl (TRUE METRIX METER) w/Device KIT, Use as directed, Disp: 1 kit, Rfl: 0   buPROPion (WELLBUTRIN SR) 150 MG 12 hr tablet, Take 1 tablet (150 mg total) by mouth 2 (two) times daily., Disp: 180 tablet, Rfl: 0   Continuous Blood Gluc Sensor (FREESTYLE LIBRE 2 SENSOR) MISC, 1 each by Does not apply route 4 (four) times daily -  before meals and at bedtime., Disp: 2 each, Rfl: 1   DULoxetine (CYMBALTA) 60 MG capsule, Take 1 capsule (60 mg total) by mouth daily., Disp: 90 capsule, Rfl: 3   famotidine (PEPCID) 20 MG tablet, One after supper, Disp: 30 tablet, Rfl: 11   glucose blood (TRUE METRIX BLOOD GLUCOSE TEST) test strip, Use as instructed, Disp: 100 each, Rfl: 12   hydrochlorothiazide (HYDRODIURIL) 12.5 MG tablet, Take 1 tablet (12.5 mg total) by mouth daily., Disp: 90 tablet, Rfl: 0   hydrochlorothiazide (HYDRODIURIL) 25 MG tablet, Take 1 tablet (25 mg total) by mouth daily., Disp: 30 tablet, Rfl: 2   hydrochlorothiazide (HYDRODIURIL) 25 MG tablet, Take 1 tablet (25 mg total) by mouth daily., Disp: 90 tablet, Rfl: 0   hydrochlorothiazide (HYDRODIURIL) 25 MG tablet, Take 1 tablet (25 mg total) by mouth daily., Disp: 30 tablet, Rfl: 2   ibuprofen (ADVIL) 800 MG tablet, Take 1 tablet (800 mg total) by mouth every 8 (eight) hours as needed., Disp: 30 tablet, Rfl: 0   ibuprofen (ADVIL) 800 MG tablet,  Take 1 tablet (800 mg total) by mouth every 6 (six) hours as needed., Disp: 90 tablet, Rfl: 1   influenza vac split quadrivalent PF (FLUARIX) 0.5 ML injection, Inject into the muscle., Disp: 0.5 mL, Rfl: 0   Insulin Pen Needle (BD PEN NEEDLE MICRO U/F) 32G X 6 MM MISC, Use for one injection weekly as directed., Disp: 100 each, Rfl: 0   nicotine (NICODERM CQ - DOSED IN MG/24 HOURS) 14 mg/24hr patch, Place 1 patch onto the skin daily., Disp: 28 patch, Rfl: 2   olmesartan (BENICAR) 40 MG tablet, Take 1 tablet (40 mg total) by mouth daily., Disp: 30 tablet, Rfl: 2   olmesartan (BENICAR) 40  MG tablet, Take 1 tablet (40 mg total) by mouth daily., Disp: 90 tablet, Rfl: 0   olmesartan (BENICAR) 40 MG tablet, Take 1 tablet (40 mg total) by mouth daily., Disp: 30 tablet, Rfl: 2   pantoprazole (PROTONIX) 40 MG tablet, Take 1 tablet (40 mg total) by mouth daily., Disp: 90 tablet, Rfl: 0   tirzepatide (MOUNJARO) 7.5 MG/0.5ML Pen, Inject 7.5 mg into the skin once a week., Disp: 2 mL, Rfl: 2   tirzepatide (MOUNJARO) 7.5 MG/0.5ML Pen, Inject 7.5 mg into the skin once a week., Disp: 2 mL, Rfl: 0   traZODone (DESYREL) 100 MG tablet, Take 1 tablet (100 mg total) by mouth at bedtime., Disp: 30 tablet, Rfl: 1   TRUEplus Lancets 28G MISC, Use as directed, Disp: 100 each, Rfl: 4  Current Facility-Administered Medications:    hydrALAZINE (APRESOLINE) tablet 25 mg, 25 mg, Oral, Once, Minette Brine, Amy J, NP   Medications ordered in this encounter:  Meds ordered this encounter  Medications   bacitracin-polymyxin b (POLYSPORIN) ophthalmic ointment    Sig: Place 1 Application into the left eye 3 (three) times daily. Apply up to three times daily- with one time being at bedtime.    Dispense:  3.5 g    Refill:  0    Order Specific Question:   Supervising Provider    Answer:   Chase Picket [id number]   cetirizine (ZYRTEC ALLERGY) 10 MG tablet    Sig: Take 1 tablet (10 mg total) by mouth daily.    Dispense:  30 tablet    Refill:  0    Order Specific Question:   Supervising Provider    Answer:   Chase Picket [id number]     *If you need refills on other medications prior to your next appointment, please contact your pharmacy*  Follow-Up: Call back or seek an in-person evaluation if the symptoms worsen or if the condition fails to improve as anticipated.  [place] [phone]  Other Instructions  -suspect a eye lid infection vs allergic dermatitis given she was outside prior to this starting up -warm compress, and baby shampoo eye washing prior to application of  ointment  -given the location will do above as she is allergic to erythromycin Advised on in person strict precautions.    If you have been instructed to have an in-person evaluation today at a local Urgent Care facility, please use the link below. It will take you to a list of all of our available [facility], including address, phone number and hours of operation. Please do not delay care.  [facility]  If you or a family member do not have a primary care provider, use the link below to schedule a visit and establish care. When you choose a [facility] primary care physician or advanced practice provider, you gain a long-term partner in health. Find a Primary Care Provider  Learn more about [place]'s in-office and virtual care options: [facility]

## 2022-11-03 ENCOUNTER — Ambulatory Visit: Payer: BC Managed Care – PPO | Attending: Family | Admitting: Pharmacist

## 2022-11-03 VITALS — Wt 250.4 lb

## 2022-11-03 DIAGNOSIS — E119 Type 2 diabetes mellitus without complications: Secondary | ICD-10-CM | POA: Diagnosis not present

## 2022-11-03 LAB — HM DIABETES EYE EXAM

## 2022-11-03 LAB — POCT GLYCOSYLATED HEMOGLOBIN (HGB A1C): HbA1c, POC (controlled diabetic range): 7.9 % — AB (ref 0.0–7.0)

## 2022-11-03 MED ORDER — TIRZEPATIDE 10 MG/0.5ML ~~LOC~~ SOAJ
10.0000 mg | SUBCUTANEOUS | 1 refills | Status: DC
  Filled 2022-12-30: qty 2, 28d supply, fill #0
  Filled 2023-01-25: qty 2, 28d supply, fill #1
  Filled 2023-02-24 – 2023-03-24 (×2): qty 2, 28d supply, fill #2
  Filled 2023-04-09 – 2023-04-28 (×2): qty 2, 28d supply, fill #3

## 2022-11-03 MED ORDER — FREESTYLE PRECISION NEO TEST VI STRP: ORAL_STRIP | 6 refills | Status: AC

## 2022-11-03 MED ORDER — FREESTYLE LANCETS MISC: 6 refills | Status: AC

## 2022-11-03 NOTE — Progress Notes (Signed)
    S:    PCP: Durene Fruits, NP  47 y.o. female who presents for diabetes evaluation, education, and management. Patient was referred and last seen by Primary Care Provider, Durene Fruits, NP, on 09/10/2022. Last seen by pharmacy clinic on 09/29/2022 where Mounjaro was increased to 7.5 mg weekly.   Today, patient arrives in good spirits and presents without any assistance. States that the effects of Mounjaro have continued to be minimal, even with intermittent fasting and working out.   Current diabetes medications include: Mounajro 7.5 mg weekly (Fridays)  Patient reports adherence to taking all medications as prescribed.   Insurance coverage: BCBS  Patient denies hypoglycemic events.  Patient has not checked BG since last visit as she does not have lancets or FreeStyle Libre strips that are compatible with her CGM reader.   Patient-reported dietary and exercise habits: She has started working out and started intermediate fasting   O:  7 day average blood glucose: no meter at visit    Lab Results  Component Value Date   HGBA1C 7.9 (A) 09/29/2022   There were no vitals filed for this visit.  Lipid Panel     Component Value Date/Time   CHOL 217 (H) 10/22/2021 0929   TRIG 220 (H) 10/22/2021 0929   HDL 44 10/22/2021 0929   CHOLHDL 4.9 (H) 10/22/2021 0929   LDLCALC 134 (H) 10/22/2021 0929    Clinical Atherosclerotic Cardiovascular Disease (ASCVD): No  The 10-year ASCVD risk score (Arnett DK, et al., 2019) is: 13%   Values used to calculate the score:     Age: 80 years     Sex: Female     Is Non-Hispanic African American: Yes     Diabetic: Yes     Tobacco smoker: Yes     Systolic Blood Pressure: 123456 mmHg     Is BP treated: Yes     HDL Cholesterol: 44 mg/dL     Total Cholesterol: 217 mg/dL    A/P: Diabetes longstanding, currently close to goal based on A1c (7.9%). Patient is able to verbalize appropriate hypoglycemia management plan. Medication adherence appears  appropriate. Patient has not been able to check blood sugar due to not having testing supplies.  -Increased dose of GLP-1/GIP Mounjaro (tirzepatide) to 10 mg weekly after she has completed 4 doses of 7.5 mg. Patient has tolerated Mounjaro without side effects.  -Sent prescription for lancets and FreeStyle Precision Neo Blood Glucose Test Strips -Intolerant to metformin.  -Patient educated on purpose, proper use, and potential adverse effects of Mounjaro.  -Extensively discussed pathophysiology of diabetes, recommended lifestyle interventions, dietary effects on blood sugar control.  -Counseled on s/sx of and management of hypoglycemia.  -Next A1c anticipated 12/2022  Written patient instructions provided. Patient verbalized understanding of treatment plan.  Total time in face to face counseling 30 minutes.    Follow-up:  Pharmacist PRN PCP clinic visit on 11/05/2022.   Joseph Art, Pharm.D. PGY-2 Ambulatory Care Pharmacy Resident 11/03/2022 11:35 AM

## 2022-11-03 NOTE — Progress Notes (Unsigned)
Patient ID: Sheri [name], female    DOB: 02-Apr-1976  MRN: [id number]  CC: No chief complaint on file.   Subjective: Sheri [name] is a 47 y.o. female who presents for  Her concerns today include:  11/03/2022 per RPH-CPP note: A/P: Diabetes longstanding, currently close to goal based on A1c (7.9%). Patient is able to verbalize appropriate hypoglycemia management plan. Medication adherence appears appropriate. Patient has not been able to check blood sugar due to not having testing supplies.  -Increased dose of GLP-1/GIP Mounjaro (tirzepatide) to 10 mg weekly after she has completed 4 doses of 7.5 mg. Patient has tolerated Mounjaro without side effects.  -Sent prescription for lancets and FreeStyle Precision Neo Blood Glucose Test Strips -Intolerant to metformin.  -Patient educated on purpose, proper use, and potential adverse effects of Mounjaro.  -Extensively discussed pathophysiology of diabetes, recommended lifestyle interventions, dietary effects on blood sugar control.  -Counseled on s/sx of and management of hypoglycemia.  -Next A1c anticipated 12/2022   Written patient instructions provided. Patient verbalized understanding of treatment plan.  Total time in face to face counseling 30 minutes.     Follow-up:  Pharmacist PRN PCP clinic visit on 11/05/2022.   Today's visit 11/05/2022: HTN - Olmesartan, Amlodipine, HCTZ  HLD - Atorvastatin   Patient Active Problem List   Diagnosis Date Noted   Shoulder instability, left 04/30/2022   Papanicolaou smear of cervix with positive high risk human papilloma virus (HPV) test 04/18/2022   Bacterial vaginosis 04/03/2022   Encounter for annual routine gynecological examination 04/03/2022   Menopause 04/03/2022   Vaginal discharge 04/03/2022   Superior glenoid labrum lesion of left shoulder 03/31/2022   Severe obesity (BMI 35.0-39.9) with comorbidity (HCC) 09/11/2021   Meralgia paresthetica 03/08/2021   Neuropathy 01/02/2021    Bilateral sciatica 01/02/2021   Upper airway cough syndrome 11/08/2020   Cigarette smoker 11/08/2020   Type 2 diabetes mellitus (HCC) 10/18/2020   Hyperlipidemia 10/18/2020   Rectal bleeding 09/27/2020   Essential hypertension 09/27/2020   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 6.7 g 1   Alcohol Swabs (ALCOHOL PREP) 70 % PADS One injection weekly. 100 each 1   amLODipine (NORVASC) 10 MG tablet Take 1 tablet (10 mg total) by mouth daily. 30 tablet 2   amLODipine (NORVASC) 10 MG tablet Take 1 tablet (10 mg total) by mouth daily. 90 tablet 0   atorvastatin (LIPITOR) 40 MG tablet Take 1 tablet (40 mg total) by mouth daily. 30 tablet 2   atorvastatin (LIPITOR) 40 MG tablet Take 1 tablet (40 mg total) by mouth daily. 90 tablet 1   bacitracin-polymyxin b (POLYSPORIN) ophthalmic ointment Place 1 Application into the left eye 3 (three) times daily. Apply up to three times daily- with one time being at bedtime. 3.5 g 0   Blood Glucose Monitoring Suppl (TRUE METRIX METER) w/Device KIT Use as directed 1 kit 0   buPROPion (WELLBUTRIN SR) 150 MG 12 hr tablet Take 1 tablet (150 mg total) by mouth 2 (two) times daily. 180 tablet 0   cetirizine (ZYRTEC ALLERGY) 10 MG tablet Take 1 tablet (10 mg total) by mouth daily. 100 tablet 0   Continuous Blood Gluc Sensor (  FREESTYLE LIBRE 2 SENSOR) MISC 1 each by Does not apply route 4 (four) times daily -  before meals and at bedtime. 2 each 1   DULoxetine (CYMBALTA) 60 MG capsule Take 1 capsule (60 mg total) by mouth daily. 90 capsule 3   famotidine (PEPCID) 20 MG tablet One after supper 30 tablet 11   glucose blood (FREESTYLE PRECISION NEO TEST) test strip Use as needed  to check blood sugar TID. 100 each 6   hydrochlorothiazide (HYDRODIURIL) 25 MG tablet Take 1 tablet (25 mg total) by mouth daily. 30 tablet 2   hydrochlorothiazide (HYDRODIURIL) 25 MG tablet Take 1 tablet (25 mg total) by mouth daily. 90 tablet 0   ibuprofen (ADVIL) 800 MG tablet Take 1 tablet (800 mg total) by mouth every 8 (eight) hours as needed. 30 tablet 0   ibuprofen (ADVIL) 800 MG tablet Take 1 tablet (800 mg total) by mouth every 6 (six) hours as needed. 90 tablet 1   Insulin Pen Needle (BD PEN NEEDLE MICRO U/F) 32G X 6 MM MISC Use for one injection weekly as directed. 100 each 0   Lancets (FREESTYLE) lancets Use as needed to check blood sugar TID. 100 each 6   nicotine (NICODERM CQ - DOSED IN MG/24 HOURS) 14 mg/24hr patch Place 1 patch onto the skin daily. 28 patch 2   olmesartan (BENICAR) 40 MG tablet Take 1 tablet (40 mg total) by mouth daily. 30 tablet 2   olmesartan (BENICAR) 40 MG tablet Take 1 tablet (40 mg total) by mouth daily. 90 tablet 0   pantoprazole (PROTONIX) 40 MG tablet Take 1 tablet (40 mg total) by mouth daily. 90 tablet 0   tirzepatide (MOUNJARO) 10 MG/0.5ML Pen Inject 10 mg into the skin once a week. 6 mL 1   traZODone (DESYREL) 100 MG tablet Take 1 tablet (100 mg total) by mouth at bedtime. 30 tablet 1   No current facility-administered medications on file prior to visit.    Allergies  Allergen Reactions   Amoxicillin Hives and Rash   Iodine Anaphylaxis and Swelling   Penicillins Hives, Itching and Rash   Shellfish-Derived Products Anaphylaxis   Erythromycin Swelling and Other (See Comments)   Metformin Diarrhea    Social History   Socioeconomic History   Marital status: Single    Spouse name: Not on file   Number of children: Not on file   Years of education: Not on file   Highest education level: 12th grade  Occupational History   Not on file  Tobacco Use   Smoking status: Some Days    Packs/day: 0.50    Years: 22.00    Additional pack years:  0.00    Total pack years: 11.00    Types: Cigarettes    Passive exposure: Current   Smokeless tobacco: Never   Tobacco comments:    4-5 cigs per day 11/08/20//lmr  Vaping Use   Vaping Use: Some days  Substance and Sexual Activity   Alcohol use: Yes    Comment: occasional   Drug use: Not Currently   Sexual activity: Never    Birth control/protection: None  Other Topics Concern   Not on file  Social History Narrative   ** Merged History Encounter **    Lives alone   Right handed   Caffeine: coffee every morning and tea.    Social Determinants of Health   Financial Resource Strain: High Risk (11/03/2022)   Overall Financial Resource Strain (CARDIA)    Difficulty of  Paying Living Expenses: Hard  Food Insecurity: Patient Declined (11/03/2022)   Hunger Vital Sign    Worried About Running Out of Food in the Last Year: Patient declined    Ran Out of Food in the Last Year: Patient declined  Transportation Needs: No Transportation Needs (11/03/2022)   PRAPARE - Administrator, Civil Service (Medical): No    Lack of Transportation (Non-Medical): No  Physical Activity: Sufficiently Active (11/03/2022)   Exercise Vital Sign    Days of Exercise per Week: 5 days    Minutes of Exercise per Session: 60 min  Stress: Stress Concern Present (11/03/2022)   [employer] of Occupational Health - Occupational Stress Questionnaire    Feeling of Stress : To some extent  Social Connections: Unknown (11/03/2022)   Social Connection and Isolation Panel [NHANES]    Frequency of Communication with Friends and Family: Patient declined    Frequency of Social Gatherings with Friends and Family: Not on file    Attends Religious Services: Patient declined    Active Member of Clubs or Organizations: Patient declined    Attends Banker Meetings: Not on file    Marital Status: Patient declined  Intimate Partner Violence: Not At Risk (11/03/2022)   Humiliation, Afraid, Rape, and Kick  questionnaire    Fear of Current or Ex-Partner: No    Emotionally Abused: No    Physically Abused: No    Sexually Abused: No    Family History  Problem Relation Age of Onset   Hypertension Mother    Diabetes Mother    Diabetes Father    Hypertension Father    Colon cancer Maternal Grandfather    Diabetes Paternal Grandmother    Rectal cancer Neg Hx    Stomach cancer Neg Hx    Esophageal cancer Neg Hx     Past Surgical History:  Procedure Laterality Date   BASAL CELL CARCINOMA EXCISION     ed records 07/26/2020   CERVICAL DISC SURGERY      ROS: Review of Systems Negative except as stated above  PHYSICAL EXAM: There were no vitals taken for this visit.  Physical Exam  {female adult master:310786} {female adult master:310785}     Latest Ref Rng & Units 02/24/2022    9:26 AM 02/13/2022   11:56 AM 01/30/2022    8:48 AM  CMP  Glucose 70 - 99 mg/dL 161  096  045   BUN 6 - 20 mg/dL 17  13  9    Creatinine 0.44 - 1.00 mg/dL 4.09  8.11  9.14   Sodium 135 - 145 mmol/L 142  143  144   Potassium 3.5 - 5.1 mmol/L 3.4  3.9  4.0   Chloride 98 - 111 mmol/L 105  105  105   CO2 22 - 32 mmol/L 30  24  24    Calcium 8.9 - 10.3 mg/dL 9.3  9.8  9.4    Lipid Panel     Component Value Date/Time   CHOL 217 (H) 10/22/2021 0929   TRIG 220 (H) 10/22/2021 0929   HDL 44 10/22/2021 0929   CHOLHDL 4.9 (H) 10/22/2021 0929   LDLCALC 134 (H) 10/22/2021 0929    CBC    Component Value Date/Time   WBC 11.8 (H) 02/24/2022 0926   RBC 4.48 02/24/2022 0926   HGB 12.7 02/24/2022 0926   HGB 14.7 10/17/2020 1151   HCT 38.6 02/24/2022 0926   HCT 43.8 10/17/2020 1151   PLT 266 02/24/2022 0926  PLT 304 10/17/2020 1151   MCV 86.2 02/24/2022 0926   MCV 86 10/17/2020 1151   MCH 28.3 02/24/2022 0926   MCHC 32.9 02/24/2022 0926   RDW 13.4 02/24/2022 0926   RDW 13.1 10/17/2020 1151   LYMPHSABS 3.6 02/24/2022 0926   MONOABS 0.7 02/24/2022 0926   EOSABS 0.2 02/24/2022 0926   BASOSABS 0.0  02/24/2022 0926    ASSESSMENT AND PLAN:  There are no diagnoses linked to this encounter.   Patient was given the opportunity to ask questions.  Patient verbalized understanding of the plan and was able to repeat key elements of the plan. Patient was given clear instructions to go to Emergency Department or return to medical center if symptoms don't improve, worsen, or new problems develop.The patient verbalized understanding.   No orders of the defined types were placed in this encounter.    Requested Prescriptions    No prescriptions requested or ordered in this encounter    No follow-ups on file.  Rema Fendt, NP

## 2022-11-05 ENCOUNTER — Encounter: Payer: BC Managed Care – PPO | Admitting: Family

## 2022-11-05 DIAGNOSIS — E785 Hyperlipidemia, unspecified: Secondary | ICD-10-CM

## 2022-11-05 DIAGNOSIS — I10 Essential (primary) hypertension: Secondary | ICD-10-CM

## 2022-11-05 DIAGNOSIS — E1165 Type 2 diabetes mellitus with hyperglycemia: Secondary | ICD-10-CM

## 2022-11-14 ENCOUNTER — Encounter: Payer: Self-pay | Admitting: Family

## 2022-11-20 ENCOUNTER — Other Ambulatory Visit: Payer: Self-pay | Admitting: Family

## 2022-11-20 ENCOUNTER — Other Ambulatory Visit (HOSPITAL_BASED_OUTPATIENT_CLINIC_OR_DEPARTMENT_OTHER): Payer: Self-pay

## 2022-11-20 DIAGNOSIS — E119 Type 2 diabetes mellitus without complications: Secondary | ICD-10-CM

## 2022-11-21 ENCOUNTER — Other Ambulatory Visit (HOSPITAL_BASED_OUTPATIENT_CLINIC_OR_DEPARTMENT_OTHER): Payer: Self-pay

## 2022-11-21 NOTE — Telephone Encounter (Signed)
Unable to refill per protocol, Rx expired. Discontinued 11/03/22.  Requested Prescriptions  Pending Prescriptions Disp Refills   tirzepatide (MOUNJARO) 7.5 MG/0.5ML Pen 2 mL 2    Sig: Inject 7.5 mg into the skin once a week.     Off-Protocol Failed - 11/20/2022  2:53 PM      Failed - Medication not assigned to a protocol, review manually.      Passed - Valid encounter within last 12 months    Recent Outpatient Visits           2 weeks ago Type 2 diabetes mellitus without complication, unspecified whether long term insulin use (HCC)   [facility] [place], Etna Green L, RPH-CPP   1 month ago Type 2 diabetes mellitus without complication, unspecified whether long term insulin use [facility])   [facility] [facility] [place], Jeannett Senior L, RPH-CPP   2 months ago Type 2 diabetes mellitus with diabetic polyneuropathy, without long-term current use of insulin [facility])   [facility], [place], NP   3 months ago Primary hypertension   [facility], [place], NP   4 months ago Primary hypertension   [facility], Salomon Fick, NP

## 2022-12-30 ENCOUNTER — Other Ambulatory Visit (HOSPITAL_BASED_OUTPATIENT_CLINIC_OR_DEPARTMENT_OTHER): Payer: Self-pay

## 2023-01-01 ENCOUNTER — Other Ambulatory Visit (HOSPITAL_BASED_OUTPATIENT_CLINIC_OR_DEPARTMENT_OTHER): Payer: Self-pay

## 2023-01-14 ENCOUNTER — Other Ambulatory Visit (HOSPITAL_BASED_OUTPATIENT_CLINIC_OR_DEPARTMENT_OTHER): Payer: Self-pay

## 2023-01-15 ENCOUNTER — Other Ambulatory Visit (HOSPITAL_BASED_OUTPATIENT_CLINIC_OR_DEPARTMENT_OTHER): Payer: Self-pay

## 2023-01-15 ENCOUNTER — Other Ambulatory Visit (HOSPITAL_COMMUNITY): Payer: Self-pay

## 2023-01-20 ENCOUNTER — Other Ambulatory Visit (HOSPITAL_BASED_OUTPATIENT_CLINIC_OR_DEPARTMENT_OTHER): Payer: Self-pay

## 2023-01-20 DIAGNOSIS — L308 Other specified dermatitis: Secondary | ICD-10-CM | POA: Diagnosis not present

## 2023-01-20 DIAGNOSIS — M7989 Other specified soft tissue disorders: Secondary | ICD-10-CM | POA: Diagnosis not present

## 2023-01-20 DIAGNOSIS — M2141 Flat foot [pes planus] (acquired), right foot: Secondary | ICD-10-CM | POA: Diagnosis not present

## 2023-01-20 DIAGNOSIS — E119 Type 2 diabetes mellitus without complications: Secondary | ICD-10-CM | POA: Diagnosis not present

## 2023-01-20 DIAGNOSIS — L858 Other specified epidermal thickening: Secondary | ICD-10-CM | POA: Diagnosis not present

## 2023-01-20 DIAGNOSIS — R238 Other skin changes: Secondary | ICD-10-CM | POA: Diagnosis not present

## 2023-01-20 DIAGNOSIS — D2371 Other benign neoplasm of skin of right lower limb, including hip: Secondary | ICD-10-CM | POA: Diagnosis not present

## 2023-01-20 MED ORDER — DOXYCYCLINE HYCLATE 100 MG PO TABS
100.0000 mg | ORAL_TABLET | Freq: Two times a day (BID) | ORAL | 0 refills | Status: AC
  Filled 2023-01-20: qty 14, 7d supply, fill #0

## 2023-01-20 MED ORDER — MUPIROCIN 2 % EX OINT
1.0000 | TOPICAL_OINTMENT | Freq: Every day | CUTANEOUS | 0 refills | Status: AC
  Filled 2023-01-20: qty 22, 20d supply, fill #0

## 2023-01-27 ENCOUNTER — Other Ambulatory Visit (HOSPITAL_BASED_OUTPATIENT_CLINIC_OR_DEPARTMENT_OTHER): Payer: Self-pay

## 2023-01-27 MED ORDER — BETAMETHASONE DIPROPIONATE 0.05 % EX CREA
1.0000 | TOPICAL_CREAM | Freq: Two times a day (BID) | CUTANEOUS | 0 refills | Status: AC
  Filled 2023-01-27: qty 60, 30d supply, fill #0

## 2023-01-27 MED ORDER — UREA 40 % EX LOTN
1.0000 | TOPICAL_LOTION | Freq: Every day | CUTANEOUS | 3 refills | Status: AC
  Filled 2023-01-27: qty 226.8, 30d supply, fill #0
  Filled 2023-09-08: qty 226.8, 30d supply, fill #1

## 2023-01-28 ENCOUNTER — Other Ambulatory Visit (HOSPITAL_BASED_OUTPATIENT_CLINIC_OR_DEPARTMENT_OTHER): Payer: Self-pay

## 2023-02-03 DIAGNOSIS — E119 Type 2 diabetes mellitus without complications: Secondary | ICD-10-CM | POA: Diagnosis not present

## 2023-02-03 DIAGNOSIS — M2141 Flat foot [pes planus] (acquired), right foot: Secondary | ICD-10-CM | POA: Diagnosis not present

## 2023-02-22 ENCOUNTER — Other Ambulatory Visit: Payer: Self-pay | Admitting: Family

## 2023-02-22 ENCOUNTER — Other Ambulatory Visit (HOSPITAL_BASED_OUTPATIENT_CLINIC_OR_DEPARTMENT_OTHER): Payer: Self-pay

## 2023-02-23 NOTE — Telephone Encounter (Signed)
Requested medications are due for refill today.  Unsure  Requested medications are on the active medications list.  yes  Last refill. unsure  Future visit scheduled.   no  Notes to clinic.  Pt has 2 entries for each of these meds on med list. Please review.    Requested Prescriptions  Pending Prescriptions Disp Refills   atorvastatin (LIPITOR) 40 MG tablet 90 tablet 1    Sig: Take 1 tablet (40 mg total) by mouth daily.     Cardiovascular:  Antilipid - Statins Failed - 02/22/2023  2:57 PM      Failed - Lipid Panel in normal range within the last 12 months    Cholesterol, Total  Date Value Ref Range Status  10/22/2021 217 (H) 100 - 199 mg/dL Final   LDL Chol Calc (NIH)  Date Value Ref Range Status  10/22/2021 134 (H) 0 - 99 mg/dL Final   HDL  Date Value Ref Range Status  10/22/2021 44 >39 mg/dL Final   Triglycerides  Date Value Ref Range Status  10/22/2021 220 (H) 0 - 149 mg/dL Final         Passed - Patient is not pregnant      Passed - Valid encounter within last 12 months    Recent Outpatient Visits           3 months ago Type 2 diabetes mellitus without complication, unspecified whether long term insulin use (HCC)   [place] [facility] [place], [place] L, RPH-CPP   4 months ago Type 2 diabetes mellitus without complication, unspecified whether long term insulin use [facility])   [place] [facility] [place], Causey L, RPH-CPP   5 months ago Type 2 diabetes mellitus with diabetic polyneuropathy, without long-term current use of insulin (HCC)   [place] [facility], [place], NP   6 months ago Primary hypertension   [place] [facility], Amy J, NP   7 months ago Primary hypertension   [place] [facility], Amy J, NP               amLODipine (NORVASC) 10 MG tablet 90 tablet 0    Sig: Take 1 tablet (10 mg total) by  mouth daily.     Cardiovascular: Calcium Channel Blockers 2 Passed - 02/22/2023  2:57 PM      Passed - Last BP in normal range    BP Readings from Last 1 Encounters:  09/10/22 119/83         Passed - Last Heart Rate in normal range    Pulse Readings from Last 1 Encounters:  09/10/22 90         Passed - Valid encounter within last 6 months    Recent Outpatient Visits           3 months ago Type 2 diabetes mellitus without complication, unspecified whether long term insulin use (HCC)   [place] [facility] [place], H[place] L, RPH-CPP   4 months ago Type 2 diabetes mellitus without complication, unspecified whether long term insulin use [facility])   [place] [facility] [place], [place] L, RPH-CPP   5 months ago Type 2 diabetes mellitus with diabetic polyneuropathy, without long-term current use of insulin [facility])   [place] [facility], [place], NP   6 months ago  Primary hypertension   [place] [facility], [place], NP   7 months ago Primary hypertension   [place] [facility], Salomon Fick, NP

## 2023-02-24 ENCOUNTER — Other Ambulatory Visit (HOSPITAL_BASED_OUTPATIENT_CLINIC_OR_DEPARTMENT_OTHER): Payer: Self-pay

## 2023-02-24 MED FILL — Amlodipine Besylate Tab 10 MG (Base Equivalent): ORAL | 90 days supply | Qty: 90 | Fill #0 | Status: CN

## 2023-02-24 MED FILL — Atorvastatin Calcium Tab 40 MG (Base Equivalent): ORAL | 90 days supply | Qty: 90 | Fill #0 | Status: CN

## 2023-03-12 ENCOUNTER — Other Ambulatory Visit (HOSPITAL_BASED_OUTPATIENT_CLINIC_OR_DEPARTMENT_OTHER): Payer: Self-pay

## 2023-03-24 ENCOUNTER — Other Ambulatory Visit (HOSPITAL_BASED_OUTPATIENT_CLINIC_OR_DEPARTMENT_OTHER): Payer: Self-pay

## 2023-04-09 ENCOUNTER — Other Ambulatory Visit (HOSPITAL_BASED_OUTPATIENT_CLINIC_OR_DEPARTMENT_OTHER): Payer: Self-pay

## 2023-05-11 ENCOUNTER — Other Ambulatory Visit: Payer: Self-pay | Admitting: Family

## 2023-05-11 NOTE — Telephone Encounter (Signed)
Medication Refill - Medication: Rx #: [id number]  tirzepatide [facility]) 10 MG/0.5ML Pen [[id number]]   Apt scheduled June 15, 2023   Has the patient contacted their pharmacy? Yes.   (Agent: If no, request that the patient contact the pharmacy for the refill. If patient does not wish to contact the pharmacy document the reason why and proceed with request.) (Agent: If yes, when and what did the pharmacy advise?)  Preferred Pharmacy (with phone number or street name): [id number] - [place],  - [address] [[id number]]    Has the patient been seen for an appointment in the last year OR does the patient have an upcoming appointment? Yes.    Agent: Please be advised that RX refills may take up to 3 business days. We ask that you follow-up with your pharmacy.

## 2023-05-12 NOTE — Telephone Encounter (Signed)
Requested medication (s) are due for refill today: yes  Requested medication (s) are on the active medication list: yes  Last refill:  11/03/22  Future visit scheduled: yes  Notes to clinic:    Medication not assigned to a protocol, review manually.     Requested Prescriptions  Pending Prescriptions Disp Refills   tirzepatide (MOUNJARO) 10 MG/0.5ML Pen 6 mL 1    Sig: Inject 10 mg into the skin once a week.     Off-Protocol Failed - 05/11/2023  9:30 AM      Failed - Medication not assigned to a protocol, review manually.      Passed - Valid encounter within last 12 months    Recent Outpatient Visits           6 months ago Type 2 diabetes mellitus without complication, unspecified whether long term insulin use (HCC)   [place] [facility] [place], [place] L, RPH-CPP   7 months ago Type 2 diabetes mellitus without complication, unspecified whether long term insulin use [facility])   [place] [facility] [place], Jeannett Senior L, RPH-CPP   8 months ago Type 2 diabetes mellitus with diabetic polyneuropathy, without long-term current use of insulin [facility])   [place] [facility], [place], NP   9 months ago Primary hypertension   [place] [facility], [place], NP   9 months ago Primary hypertension   [place] [facility], Salomon Fick, NP       Future Appointments             In 1 month Rema Fendt, NP [facility]

## 2023-05-12 NOTE — Telephone Encounter (Signed)
Patient has not been seen in some time with upcoming appt scheduled in November.

## 2023-05-13 ENCOUNTER — Ambulatory Visit: Payer: BC Managed Care – PPO | Admitting: Family Medicine

## 2023-05-13 MED ORDER — TIRZEPATIDE 10 MG/0.5ML ~~LOC~~ SOAJ: 10.0000 mg | SUBCUTANEOUS | 0 refills | Status: DC

## 2023-05-13 NOTE — Telephone Encounter (Signed)
 Complete. Schedule appointment.

## 2023-06-04 IMAGING — DX DG CHEST 2V
2 series · 2 of 2 positions shown · non-contrast
Comparison: Chest radiograph 11/08/2020

CLINICAL DATA: Cough

EXAM:
CHEST - 2 VIEW

[chest pa]
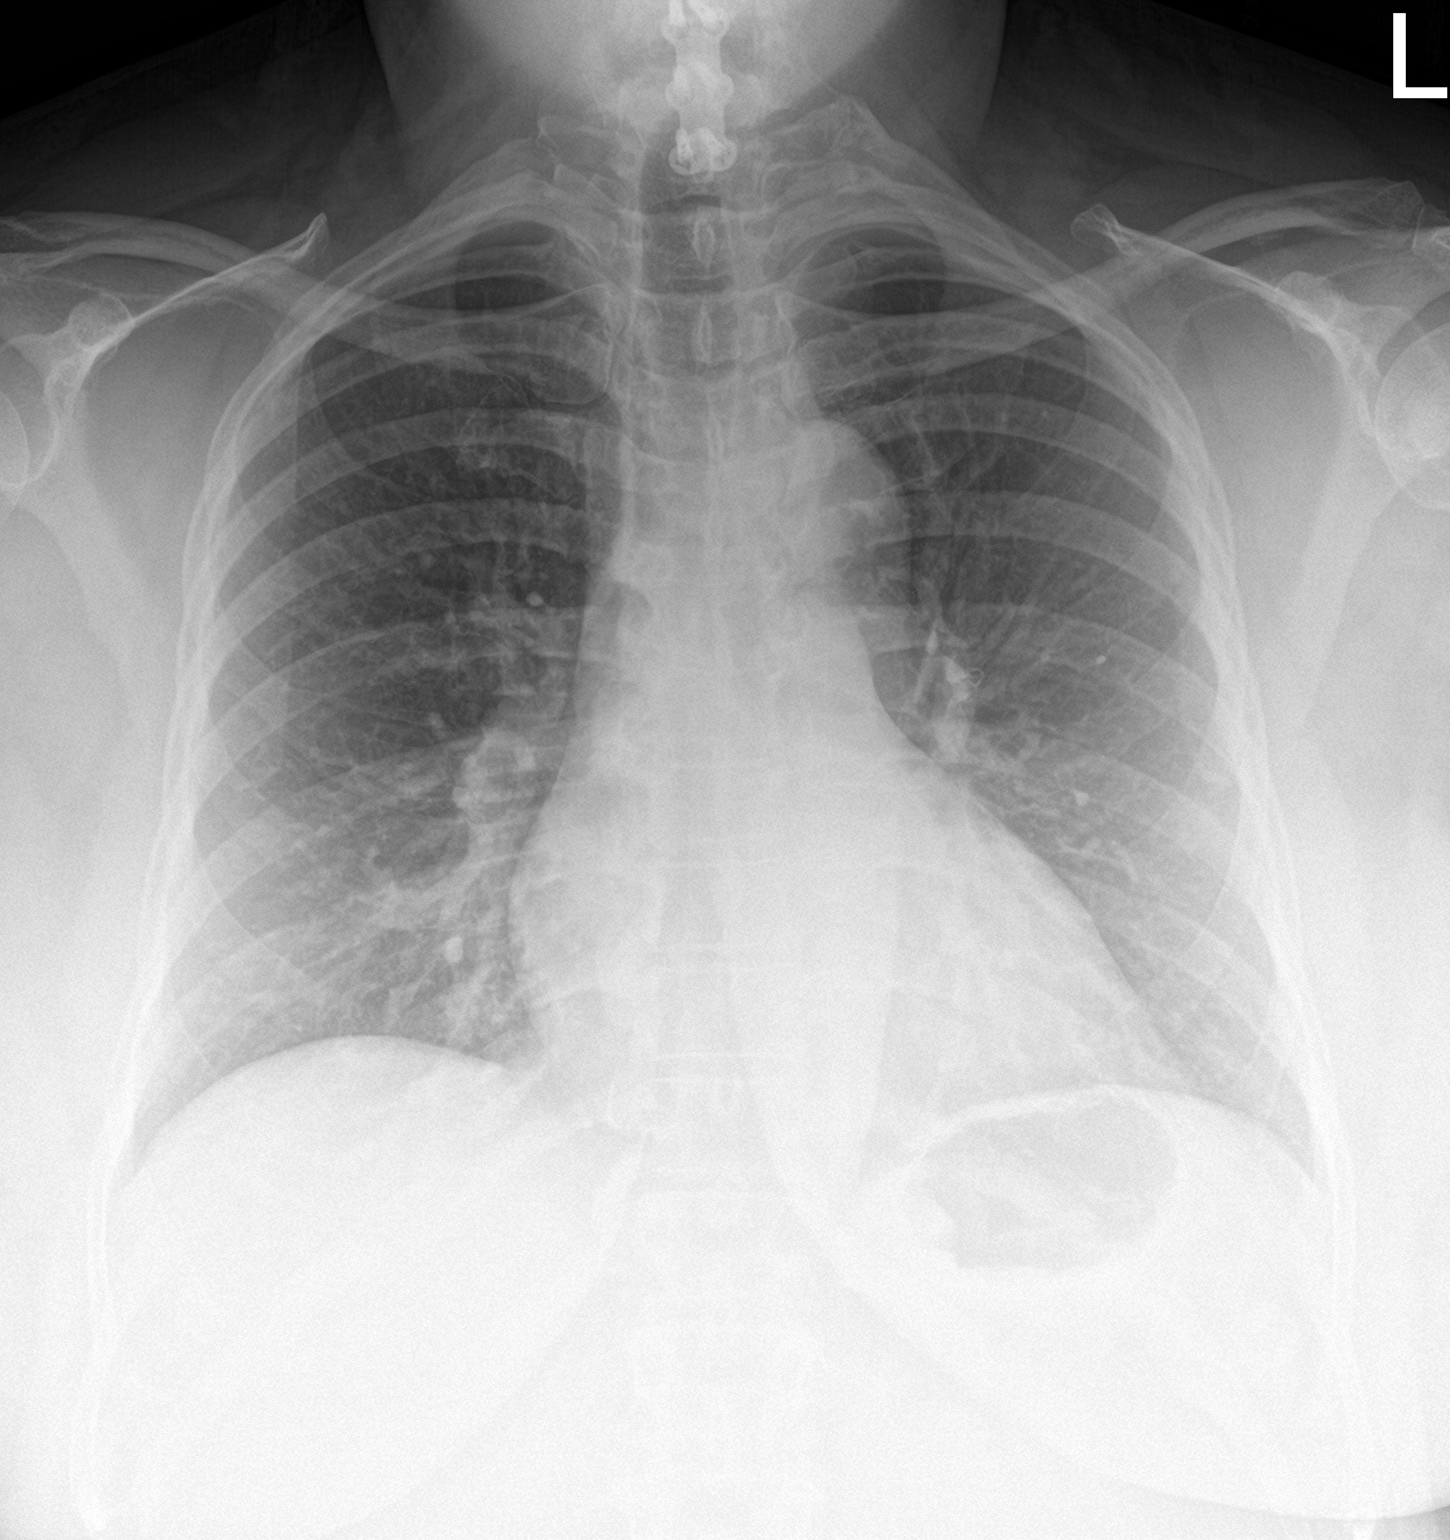

[chest lat]
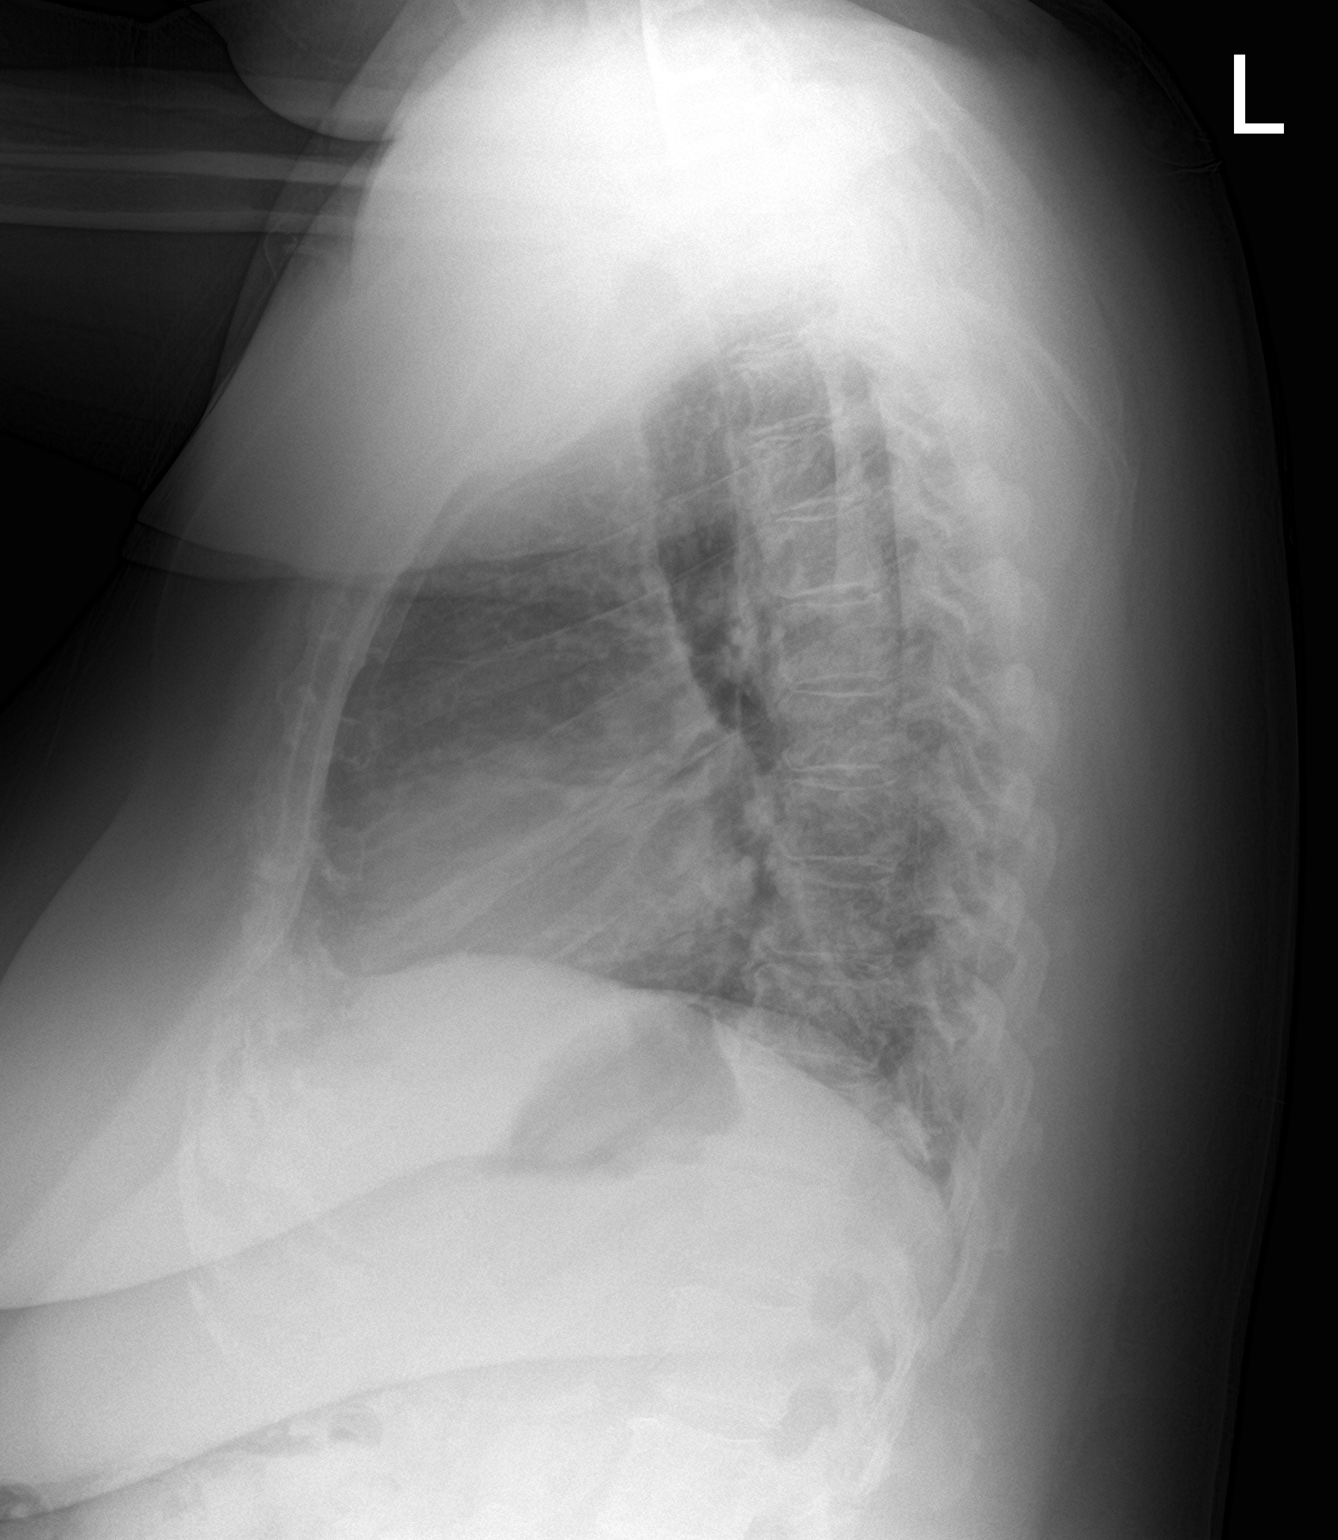

[2 of 2 positions shown; findings below may reference images not displayed]

FINDINGS: The cardiomediastinal silhouette is normal.

There is no focal consolidation or pulmonary edema. There is no
pleural effusion or pneumothorax.

There is no acute osseous abnormality.
IMPRESSION: No radiographic evidence of acute cardiopulmonary process.

## 2023-06-15 ENCOUNTER — Ambulatory Visit (INDEPENDENT_AMBULATORY_CARE_PROVIDER_SITE_OTHER): Payer: BC Managed Care – PPO | Admitting: Family

## 2023-06-15 ENCOUNTER — Encounter: Payer: Self-pay | Admitting: Family

## 2023-06-15 VITALS — BP 161/89 | HR 70 | Temp 99.1°F | Ht 68.0 in | Wt 247.8 lb

## 2023-06-15 DIAGNOSIS — F17210 Nicotine dependence, cigarettes, uncomplicated: Secondary | ICD-10-CM

## 2023-06-15 DIAGNOSIS — I10 Essential (primary) hypertension: Secondary | ICD-10-CM

## 2023-06-15 DIAGNOSIS — Z7985 Long-term (current) use of injectable non-insulin antidiabetic drugs: Secondary | ICD-10-CM | POA: Diagnosis not present

## 2023-06-15 DIAGNOSIS — E1165 Type 2 diabetes mellitus with hyperglycemia: Secondary | ICD-10-CM | POA: Diagnosis not present

## 2023-06-15 DIAGNOSIS — E785 Hyperlipidemia, unspecified: Secondary | ICD-10-CM | POA: Diagnosis not present

## 2023-06-15 LAB — POCT GLYCOSYLATED HEMOGLOBIN (HGB A1C): HbA1c, POC (controlled diabetic range): 6.4 % (ref 0.0–7.0)

## 2023-06-15 MED ORDER — OLMESARTAN MEDOXOMIL 40 MG PO TABS: 40.0000 mg | ORAL_TABLET | Freq: Every day | ORAL | 0 refills | Status: AC

## 2023-06-15 MED ORDER — AMLODIPINE BESYLATE 10 MG PO TABS: 10.0000 mg | ORAL_TABLET | Freq: Every day | ORAL | 0 refills | Status: AC

## 2023-06-15 MED ORDER — TIRZEPATIDE 10 MG/0.5ML ~~LOC~~ SOAJ: 10.0000 mg | SUBCUTANEOUS | 0 refills | Status: AC

## 2023-06-15 MED ORDER — HYDROCHLOROTHIAZIDE 25 MG PO TABS: 25.0000 mg | ORAL_TABLET | Freq: Every day | ORAL | 0 refills | Status: AC

## 2023-06-15 MED ORDER — ATORVASTATIN CALCIUM 40 MG PO TABS: 40.0000 mg | ORAL_TABLET | Freq: Every day | ORAL | 1 refills | Status: DC

## 2023-06-15 NOTE — Progress Notes (Signed)
States no other concerns to discuss.

## 2023-06-15 NOTE — Progress Notes (Signed)
Patient ID: Sheri [name], female    DOB: 1976/08/09  MRN: [id number]  CC: Chronic Conditions Follow-Up  Subjective: Sheri [name] is a 47 y.o. female who presents for chronic conditions follow-up.   Her concerns today include:  - Reports she is currently living in [place] and not ready to establish care with a primary provider in there. I discussed with patient in detail the importance of establishing with a primary provider in [place] for continued management. Patient verbalized understanding. - Reports she has not taken Olmesartan, Amlodipine, and Hydrochlorothiazide in at least 2 months. Reports she was doing well on regimen, no issues/concerns. Reports she has been watching what she eats and exercising routinely and states thought this would help. She does not consistently check blood pressure outside of office. She does not complain of red flag symptoms such as but not limited to chest pain, shortness of breath, worst headache of life, nausea/vomiting.  - Doing well on Mounjaro, no issues/concerns. She is watching what she eats. She exercises routinely. She does not check blood sugars outside of office consistently. She denies red flag symptoms associated with diabetes.  - Reports she is established with Ophthalmology and Podiatry for diabetic screenings.  - Doing well on Atorvastatin, no issues/concerns.   Patient Active Problem List   Diagnosis Date Noted   Shoulder instability, left 04/30/2022   Papanicolaou smear of cervix with positive high risk human papilloma virus (HPV) test 04/18/2022   Bacterial vaginosis 04/03/2022   Encounter for annual routine gynecological examination 04/03/2022   Menopause 04/03/2022   Vaginal discharge 04/03/2022   Superior glenoid labrum lesion of left shoulder 03/31/2022   Severe obesity (BMI 35.0-39.9) with comorbidity (HCC) 09/11/2021   Meralgia paresthetica 03/08/2021   Neuropathy 01/02/2021   Bilateral sciatica 01/02/2021    Upper airway cough syndrome 11/08/2020   Cigarette smoker 11/08/2020   Type 2 diabetes mellitus (HCC) 10/18/2020   Hyperlipidemia 10/18/2020   Rectal bleeding 09/27/2020   Essential hypertension 09/27/2020   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 6.7 g 1   bacitracin-polymyxin b (POLYSPORIN) ophthalmic ointment Place 1 Application into the left eye 3 (three) times daily. Apply up to three times daily- with one time being at bedtime. 3.5 g 0   betamethasone dipropionate 0.05 % cream Apply 1 application externally 2 times a day, and for no more than 2 weeks. 60 g 0   Blood Glucose Monitoring Suppl (TRUE METRIX METER) w/Device KIT Use as directed 1 kit 0   cetirizine (ZYRTEC ALLERGY) 10 MG tablet Take 1 tablet (10 mg total) by mouth daily. 100 tablet 0   doxycycline (VIBRA-TABS) 100 MG tablet Take 1 tablet (100 mg total) by mouth every 12 (twelve) hours. 14 tablet 0   famotidine (PEPCID) 20 MG tablet One after supper 30 tablet 11   glucose blood (FREESTYLE PRECISION NEO TEST) test strip Use as needed to check blood sugar TID. 100 each 6   ibuprofen (ADVIL) 800 MG tablet Take 1 tablet (800 mg total) by mouth every 6 (six) hours as needed. 90 tablet 1   Insulin Pen Needle (BD PEN NEEDLE MICRO U/F) 32G X 6 MM MISC Use for one  injection weekly as directed. 100 each 0   Lancets (FREESTYLE) lancets Use as needed to check blood sugar TID. 100 each 6   mupirocin ointment (BACTROBAN) 2 % Apply 1 application externally daily. 22 g 0   nicotine (NICODERM CQ - DOSED IN MG/24 HOURS) 14 mg/24hr patch Place 1 patch onto the skin daily. 28 patch 2   Urea 40 % LOTN Apply 1 application externally daily. 226.8 g 3    Alcohol Swabs (ALCOHOL PREP) 70 % PADS One injection weekly. 100 each 1   amLODipine (NORVASC) 10 MG tablet Take 1 tablet (10 mg total) by mouth daily. 30 tablet 2   atorvastatin (LIPITOR) 40 MG tablet Take 1 tablet (40 mg total) by mouth daily. 30 tablet 2   buPROPion (WELLBUTRIN SR) 150 MG 12 hr tablet Take 1 tablet (150 mg total) by mouth 2 (two) times daily. 180 tablet 0   DULoxetine (CYMBALTA) 60 MG capsule Take 1 capsule (60 mg total) by mouth daily. 90 capsule 3   hydrochlorothiazide (HYDRODIURIL) 25 MG tablet Take 1 tablet (25 mg total) by mouth daily. 30 tablet 2   ibuprofen (ADVIL) 800 MG tablet Take 1 tablet (800 mg total) by mouth every 8 (eight) hours as needed. 30 tablet 0   olmesartan (BENICAR) 40 MG tablet Take 1 tablet (40 mg total) by mouth daily. 30 tablet 2   pantoprazole (PROTONIX) 40 MG tablet Take 1 tablet (40 mg total) by mouth daily. 90 tablet 0   traZODone (DESYREL) 100 MG tablet Take 1 tablet (100 mg total) by mouth at bedtime. 30 tablet 1   No current facility-administered medications on file prior to visit.    Allergies  Allergen Reactions   Amoxicillin Hives and Rash   Iodine Anaphylaxis and Swelling   Penicillins Hives, Itching and Rash   Shellfish-Derived Products Anaphylaxis   Erythromycin Swelling and Other (See Comments)   Metformin Diarrhea    Social History   Socioeconomic History   Marital status: Single    Spouse name: Not on file   Number of children: Not on file   Years of education: Not on file   Highest education level: 12th grade  Occupational History   Not on file  Tobacco Use   Smoking status: Some Days    Current packs/day: 0.50    Average packs/day: 0.5 packs/day for 22.0 years (11.0 ttl pk-yrs)    Types: Cigarettes    Passive exposure: Current   Smokeless tobacco: Never   Tobacco comments:    4-5 cigs per day 11/08/20//lmr  Vaping Use   Vaping status: Some Days  Substance and Sexual Activity   Alcohol use: Yes    Comment:  occasional   Drug use: Not Currently   Sexual activity: Never    Birth control/protection: None  Other Topics Concern   Not on file  Social History Narrative   ** Merged History Encounter **    Lives alone   Right handed   Caffeine: coffee every morning and tea.    Social Determinants of Health   Financial Resource Strain: High Risk (11/03/2022)   Overall Financial Resource Strain (CARDIA)    Difficulty of Paying Living Expenses: Hard  Food Insecurity: Patient Declined (11/03/2022)   Hunger Vital Sign    Worried About Running Out of Food in the Last Year: Patient declined    Ran Out of Food in the Last Year: Patient declined  Transportation Needs: No Transportation Needs (11/03/2022)   PRAPARE - Transportation  Lack of Transportation (Medical): No    Lack of Transportation (Non-Medical): No  Physical Activity: Sufficiently Active (11/03/2022)   Exercise Vital Sign    Days of Exercise per Week: 5 days    Minutes of Exercise per Session: 60 min  Stress: Stress Concern Present (11/03/2022)   [employer] of Occupational Health - Occupational Stress Questionnaire    Feeling of Stress : To some extent  Social Connections: Unknown (11/03/2022)   Social Connection and Isolation Panel [NHANES]    Frequency of Communication with Friends and Family: Patient declined    Frequency of Social Gatherings with Friends and Family: Not on file    Attends Religious Services: Patient declined    Active Member of Clubs or Organizations: Patient declined    Attends Banker Meetings: Not on file    Marital Status: Patient declined  Intimate Partner Violence: Not At Risk (11/03/2022)   Humiliation, Afraid, Rape, and Kick questionnaire    Fear of Current or Ex-Partner: No    Emotionally Abused: No    Physically Abused: No    Sexually Abused: No    Family History  Problem Relation Age of Onset   Hypertension Mother    Diabetes Mother    Diabetes Father    Hypertension Father     Colon cancer Maternal Grandfather    Diabetes Paternal Grandmother    Rectal cancer Neg Hx    Stomach cancer Neg Hx    Esophageal cancer Neg Hx     Past Surgical History:  Procedure Laterality Date   BASAL CELL CARCINOMA EXCISION     ed records 07/26/2020   CERVICAL DISC SURGERY      ROS: Review of Systems Negative except as stated above  PHYSICAL EXAM: BP (!) 161/89   Pulse 70   Temp 99.1 F (37.3 C) (Oral)   Ht 5\' 8"  (1.727 m)   Wt 247 lb 12.8 oz (112.4 kg)   SpO2 97%   BMI 37.68 kg/m   Physical Exam HENT:     Head: Normocephalic and atraumatic.     Nose: Nose normal.     Mouth/Throat:     Mouth: Mucous membranes are moist.     Pharynx: Oropharynx is clear.  Eyes:     Extraocular Movements: Extraocular movements intact.     Conjunctiva/sclera: Conjunctivae normal.     Pupils: Pupils are equal, round, and reactive to light.  Cardiovascular:     Rate and Rhythm: Normal rate and regular rhythm.     Pulses: Normal pulses.     Heart sounds: Normal heart sounds.  Pulmonary:     Effort: Pulmonary effort is normal.     Breath sounds: Normal breath sounds.  Musculoskeletal:        General: Normal range of motion.     Cervical back: Normal range of motion and neck supple.  Neurological:     General: No focal deficit present.     Mental Status: She is alert and oriented to person, place, and time.  Psychiatric:        Mood and Affect: Mood normal.        Behavior: Behavior normal.    Results for orders placed or performed in visit on 06/15/23  POCT glycosylated hemoglobin (Hb A1C)  Result Value Ref Range   Hemoglobin A1C     HbA1c POC (<> result, manual entry)     HbA1c, POC (prediabetic range)     HbA1c, POC (controlled diabetic range) 6.4 0.0 -  7.0 %    ASSESSMENT AND PLAN: 1. Primary hypertension - Blood pressure not at goal during today's visit. Patient asymptomatic without chest pressure, chest pain, palpitations, shortness of breath, worst headache  of life, and any additional red flag symptoms. - Resume Amlodipine, Olmesartan, and Hydrochlorothiazide as prescribed.  - Counseled on blood pressure goal of less than 130/80, low-sodium, DASH diet, medication compliance, and 150 minutes of moderate intensity exercise per week as tolerated. Counseled on medication adherence and adverse effects. - Follow-up with primary provider in 2 weeks or sooner if needed for blood pressure check.  - amLODipine (NORVASC) 10 MG tablet; Take 1 tablet (10 mg total) by mouth daily.  Dispense: 90 tablet; Refill: 0 - olmesartan (BENICAR) 40 MG tablet; Take 1 tablet (40 mg total) by mouth daily.  Dispense: 90 tablet; Refill: 0 - hydrochlorothiazide (HYDRODIURIL) 25 MG tablet; Take 1 tablet (25 mg total) by mouth daily.  Dispense: 90 tablet; Refill: 0  2. Type 2 diabetes mellitus with hyperglycemia, without long-term current use of insulin (HCC) - Hemoglobin A1c at goal at 6.4%, goal 7%.  - Continue Tirzepatide as prescribed. Counseled on medication adherence/adverse effects.  - Routine screening.  - Discussed the importance of healthy eating habits, low-carbohydrate diet, low-sugar diet, regular aerobic exercise (at least 150 minutes a week as tolerated) and medication compliance to achieve or maintain control of diabetes. - Follow-up with primary provider in 3 months or sooner if needed.  - POCT glycosylated hemoglobin (Hb A1C) - Microalbumin / creatinine urine ratio - Basic Metabolic Panel - tirzepatide (MOUNJARO) 10 MG/0.5ML Pen; Inject 10 mg into the skin once a week.  Dispense: 6 mL; Refill: 0  3. Hyperlipidemia, unspecified hyperlipidemia type - Continue Atorvastatin as prescribed. Counseled on medication adherence/adverse effects.  - Routine screening.  - Follow-up with primary provider as scheduled.  - atorvastatin (LIPITOR) 40 MG tablet; Take 1 tablet (40 mg total) by mouth daily.  Dispense: 90 tablet; Refill: 1 - Lipid panel    Patient was given the  opportunity to ask questions.  Patient verbalized understanding of the plan and was able to repeat key elements of the plan. Patient was given clear instructions to go to Emergency Department or return to medical center if symptoms don't improve, worsen, or new problems develop.The patient verbalized understanding.   Orders Placed This Encounter  Procedures   Microalbumin / creatinine urine ratio   Basic Metabolic Panel   Lipid panel   POCT glycosylated hemoglobin (Hb A1C)     Requested Prescriptions   Signed Prescriptions Disp Refills   atorvastatin (LIPITOR) 40 MG tablet 90 tablet 1    Sig: Take 1 tablet (40 mg total) by mouth daily.   amLODipine (NORVASC) 10 MG tablet 90 tablet 0    Sig: Take 1 tablet (10 mg total) by mouth daily.   olmesartan (BENICAR) 40 MG tablet 90 tablet 0    Sig: Take 1 tablet (40 mg total) by mouth daily.   hydrochlorothiazide (HYDRODIURIL) 25 MG tablet 90 tablet 0    Sig: Take 1 tablet (25 mg total) by mouth daily.   tirzepatide (MOUNJARO) 10 MG/0.5ML Pen 6 mL 0    Sig: Inject 10 mg into the skin once a week.    Return in about 2 weeks (around 06/29/2023) for Follow-Up or next available blood pressure check.  Rema Fendt, NP

## 2023-06-17 ENCOUNTER — Telehealth: Payer: Self-pay | Admitting: Family

## 2023-06-17 LAB — SPECIMEN STATUS REPORT

## 2023-06-17 NOTE — Telephone Encounter (Signed)
Copied from CRM [id number]. Topic: General - Other >> Jun 17, 2023  3:57 PM Ja-Kwan M wrote: Reason for CRM: Trina with [employer] stated that they received a urine bottle but no test information listed. Darreld Mclean stated that they need to know what the request is for the urine sample. Cb# [phone]

## 2023-06-18 ENCOUNTER — Other Ambulatory Visit: Payer: Self-pay | Admitting: Family

## 2023-06-18 ENCOUNTER — Other Ambulatory Visit: Payer: Self-pay

## 2023-06-18 DIAGNOSIS — E785 Hyperlipidemia, unspecified: Secondary | ICD-10-CM

## 2023-06-18 LAB — BASIC METABOLIC PANEL
BUN/Creatinine Ratio: 28 — ABNORMAL HIGH (ref 9–23)
BUN: 19 mg/dL (ref 6–24)
CO2: 22 mmol/L (ref 20–29)
Calcium: 10 mg/dL (ref 8.7–10.2)
Chloride: 105 mmol/L (ref 96–106)
Creatinine, Ser: 0.69 mg/dL (ref 0.57–1.00)
Glucose: 103 mg/dL — ABNORMAL HIGH (ref 70–99)
Potassium: 4.2 mmol/L (ref 3.5–5.2)
Sodium: 142 mmol/L (ref 134–144)
eGFR: 108 mL/min/{1.73_m2} (ref >59)

## 2023-06-18 LAB — LIPID PANEL
Chol/HDL Ratio: 4.2 ratio (ref 0.0–4.4)
Cholesterol, Total: 250 mg/dL — ABNORMAL HIGH (ref 100–199)
HDL: 59 mg/dL (ref >39)
LDL Chol Calc (NIH): 140 mg/dL — ABNORMAL HIGH (ref 0–99)
Triglycerides: 286 mg/dL — ABNORMAL HIGH (ref 0–149)
VLDL Cholesterol Cal: 51 mg/dL — ABNORMAL HIGH (ref 5–40)

## 2023-06-18 MED ORDER — ATORVASTATIN CALCIUM 80 MG PO TABS: 80.0000 mg | ORAL_TABLET | Freq: Every day | ORAL | 0 refills | Status: AC

## 2023-06-19 NOTE — Telephone Encounter (Signed)
I spoke to someone at [facility] about this.

## 2023-07-01 ENCOUNTER — Ambulatory Visit (INDEPENDENT_AMBULATORY_CARE_PROVIDER_SITE_OTHER): Payer: BC Managed Care – PPO | Admitting: Family

## 2023-07-01 ENCOUNTER — Encounter: Payer: Self-pay | Admitting: Family

## 2023-07-01 VITALS — BP 130/84 | HR 92 | Temp 98.2°F | Ht 69.0 in | Wt 247.0 lb

## 2023-07-01 DIAGNOSIS — I10 Essential (primary) hypertension: Secondary | ICD-10-CM

## 2023-07-01 DIAGNOSIS — R0683 Snoring: Secondary | ICD-10-CM

## 2023-07-01 DIAGNOSIS — E785 Hyperlipidemia, unspecified: Secondary | ICD-10-CM | POA: Diagnosis not present

## 2023-07-01 DIAGNOSIS — H938X3 Other specified disorders of ear, bilateral: Secondary | ICD-10-CM | POA: Diagnosis not present

## 2023-07-01 NOTE — Progress Notes (Signed)
Patient states she thinks she has sleep apnea, asking for sleep study.   Patient wants Flu vaccine.

## 2023-07-01 NOTE — Progress Notes (Signed)
Patient ID: ONETTA WUJEK, female    DOB: 1975/11/26  MRN: [id number]  CC: Chronic Conditions Follow-Up  Subjective: Sheri [name] is a 47 y.o. female who presents for chronic conditions follow-up.   Her concerns today include:  - Doing well on Amlodipine, Olmesartan, and Hydrochlorothiazide, no issues/concerns. She does not complain of red flag symptoms such as but not limited to chest pain, shortness of breath, worst headache of life, nausea/vomiting.  - Doing well on Atorvastatin, no issues/concerns.  - Bilateral ears feel "clogged". Denies red flag symptoms.  - Requests Sleep Study due to snoring.  - Patient reports living in [place] and needs referral sent to [place], [place]. I discussed with patient the importance of establishing with primary provider in [place]. Patient verbalized understanding.    Patient Active Problem List   Diagnosis Date Noted   Shoulder instability, left 04/30/2022   Papanicolaou smear of cervix with positive high risk human papilloma virus (HPV) test 04/18/2022   Bacterial vaginosis 04/03/2022   Encounter for annual routine gynecological examination 04/03/2022   Menopause 04/03/2022   Vaginal discharge 04/03/2022   Superior glenoid labrum lesion of left shoulder 03/31/2022   Severe obesity (BMI 35.0-39.9) with comorbidity (HCC) 09/11/2021   Meralgia paresthetica 03/08/2021   Neuropathy 01/02/2021   Bilateral sciatica 01/02/2021   Upper airway cough syndrome 11/08/2020   Cigarette smoker 11/08/2020   Type 2 diabetes mellitus (HCC) 10/18/2020   Hyperlipidemia 10/18/2020   Rectal bleeding 09/27/2020   Essential hypertension 09/27/2020   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 6.7 g 1   amLODipine (NORVASC) 10 MG tablet Take 1 tablet (10 mg total) by mouth daily. 90 tablet 0   atorvastatin (LIPITOR) 80 MG tablet Take 1 tablet (80 mg total) by mouth daily. 90 tablet 0   Blood Glucose Monitoring Suppl (TRUE METRIX METER) w/Device KIT Use as directed 1 kit 0   cetirizine (ZYRTEC ALLERGY) 10 MG tablet Take 1 tablet (10 mg total) by mouth daily. 100 tablet 0   famotidine (PEPCID) 20 MG tablet One after supper 30 tablet 11   glucose blood (FREESTYLE PRECISION NEO TEST) test strip Use as needed to check blood sugar TID. 100 each 6   hydrochlorothiazide (HYDRODIURIL) 25 MG tablet Take 1 tablet (25 mg total) by mouth daily. 90 tablet 0   Insulin Pen Needle (BD PEN NEEDLE MICRO U/F) 32G X 6 MM MISC Use for one injection weekly as directed. 100 each 0   Lancets (FREESTYLE) lancets Use as needed to check blood sugar TID. 100 each 6   olmesartan (BENICAR) 40 MG tablet Take 1 tablet (40 mg total) by mouth daily. 90 tablet 0   tirzepatide (MOUNJARO) 10 MG/0.5ML Pen Inject 10 mg into the skin once a week. 6 mL 0   Urea 40 % LOTN Apply 1 application externally daily. 226.8 g 3   Alcohol Swabs (ALCOHOL PREP) 70 % PADS One injection weekly. 100 each 1   amLODipine (NORVASC) 10 MG tablet Take 1 tablet (10 mg total) by mouth daily. 30 tablet 2   bacitracin-polymyxin b (POLYSPORIN) ophthalmic ointment Place 1 Application into the left eye 3 (three) times daily. Apply  up to three times daily- with one time being at bedtime. (Patient not taking: Reported on 07/01/2023) 3.5 g 0   betamethasone dipropionate 0.05 % cream Apply 1 application externally 2 times a day, and for no more than 2 weeks. (Patient not taking: Reported on 07/01/2023) 60 g 0   buPROPion (WELLBUTRIN SR) 150 MG 12 hr tablet Take 1 tablet (150 mg total) by mouth 2 (two) times daily. 180 tablet 0   doxycycline (VIBRA-TABS) 100 MG tablet Take 1 tablet (100 mg total) by mouth  every 12 (twelve) hours. (Patient not taking: Reported on 07/01/2023) 14 tablet 0   DULoxetine (CYMBALTA) 60 MG capsule Take 1 capsule (60 mg total) by mouth daily. 90 capsule 3   hydrochlorothiazide (HYDRODIURIL) 25 MG tablet Take 1 tablet (25 mg total) by mouth daily. 30 tablet 2   ibuprofen (ADVIL) 800 MG tablet Take 1 tablet (800 mg total) by mouth every 8 (eight) hours as needed. 30 tablet 0   ibuprofen (ADVIL) 800 MG tablet Take 1 tablet (800 mg total) by mouth every 6 (six) hours as needed. (Patient not taking: Reported on 07/01/2023) 90 tablet 1   mupirocin ointment (BACTROBAN) 2 % Apply 1 application externally daily. (Patient not taking: Reported on 07/01/2023) 22 g 0   nicotine (NICODERM CQ - DOSED IN MG/24 HOURS) 14 mg/24hr patch Place 1 patch onto the skin daily. (Patient not taking: Reported on 07/01/2023) 28 patch 2   olmesartan (BENICAR) 40 MG tablet Take 1 tablet (40 mg total) by mouth daily. 30 tablet 2   pantoprazole (PROTONIX) 40 MG tablet Take 1 tablet (40 mg total) by mouth daily. 90 tablet 0   traZODone (DESYREL) 100 MG tablet Take 1 tablet (100 mg total) by mouth at bedtime. 30 tablet 1   No current facility-administered medications on file prior to visit.    Allergies  Allergen Reactions   Amoxicillin Hives and Rash   Iodine Anaphylaxis and Swelling   Penicillins Hives, Itching and Rash   Shellfish-Derived Products Anaphylaxis   Erythromycin Swelling and Other (See Comments)   Metformin Diarrhea    Social History   Socioeconomic History   Marital status: Single    Spouse name: Not on file   Number of children: Not on file   Years of education: Not on file   Highest education level: 12th grade  Occupational History   Not on file  Tobacco Use   Smoking status: Some Days    Current packs/day: 0.50    Average packs/day: 0.5 packs/day for 22.0 years (11.0 ttl pk-yrs)    Types: Cigarettes    Passive exposure: Current   Smokeless tobacco: Never   Tobacco  comments:    4-5 cigs per day 11/08/20//lmr  Vaping Use   Vaping status: Some Days  Substance and Sexual Activity   Alcohol use: Yes    Comment: occasional   Drug use: Not Currently   Sexual activity: Never    Birth control/protection: None  Other Topics Concern   Not on file  Social History Narrative   ** Merged History Encounter **    Lives alone   Right handed   Caffeine: coffee every morning and tea.    Social Determinants of Health   Financial Resource Strain: High Risk (11/03/2022)   Overall Financial Resource Strain (CARDIA)    Difficulty of Paying Living Expenses: Hard  Food Insecurity: Patient Declined (11/03/2022)   Hunger Vital Sign    Worried About Running Out of Food in the  Last Year: Patient declined    Ran Out of Food in the Last Year: Patient declined  Transportation Needs: No Transportation Needs (11/03/2022)   PRAPARE - Administrator, Civil Service (Medical): No    Lack of Transportation (Non-Medical): No  Physical Activity: Sufficiently Active (11/03/2022)   Exercise Vital Sign    Days of Exercise per Week: 5 days    Minutes of Exercise per Session: 60 min  Stress: Stress Concern Present (11/03/2022)   [employer] of Occupational Health - Occupational Stress Questionnaire    Feeling of Stress : To some extent  Social Connections: Unknown (11/03/2022)   Social Connection and Isolation Panel [NHANES]    Frequency of Communication with Friends and Family: Patient declined    Frequency of Social Gatherings with Friends and Family: Not on file    Attends Religious Services: Patient declined    Active Member of Clubs or Organizations: Patient declined    Attends Banker Meetings: Not on file    Marital Status: Patient declined  Intimate Partner Violence: Not At Risk (11/03/2022)   Humiliation, Afraid, Rape, and Kick questionnaire    Fear of Current or Ex-Partner: No    Emotionally Abused: No    Physically Abused: No    Sexually  Abused: No    Family History  Problem Relation Age of Onset   Hypertension Mother    Diabetes Mother    Diabetes Father    Hypertension Father    Colon cancer Maternal Grandfather    Diabetes Paternal Grandmother    Rectal cancer Neg Hx    Stomach cancer Neg Hx    Esophageal cancer Neg Hx     Past Surgical History:  Procedure Laterality Date   BASAL CELL CARCINOMA EXCISION     ed records 07/26/2020   CERVICAL DISC SURGERY      ROS: Review of Systems Negative except as stated above  PHYSICAL EXAM: BP 130/84   Pulse 92   Temp 98.2 F (36.8 C) (Oral)   Ht 5\' 9"  (1.753 m)   Wt 247 lb (112 kg)   SpO2 99%   BMI 36.48 kg/m   Physical Exam HENT:     Head: Normocephalic and atraumatic.     Right Ear: Tympanic membrane, ear canal and external ear normal.     Left Ear: Tympanic membrane, ear canal and external ear normal.     Nose: Nose normal.     Mouth/Throat:     Mouth: Mucous membranes are moist.     Pharynx: Oropharynx is clear.  Eyes:     Extraocular Movements: Extraocular movements intact.     Conjunctiva/sclera: Conjunctivae normal.     Pupils: Pupils are equal, round, and reactive to light.  Cardiovascular:     Rate and Rhythm: Normal rate and regular rhythm.     Pulses: Normal pulses.     Heart sounds: Normal heart sounds.  Pulmonary:     Effort: Pulmonary effort is normal.     Breath sounds: Normal breath sounds.  Musculoskeletal:        General: Normal range of motion.     Cervical back: Normal range of motion and neck supple.  Neurological:     General: No focal deficit present.     Mental Status: She is alert and oriented to person, place, and time.  Psychiatric:        Mood and Affect: Mood normal.        Behavior: Behavior normal.  ASSESSMENT AND PLAN: 1. Primary hypertension - Continue Amlodipine, Olmesartan, and Hydrochlorothiazide as prescribed. No refills needed as of present.  - Counseled on blood pressure goal of less than 130/80,  low-sodium, DASH diet, medication compliance, and 150 minutes of moderate intensity exercise per week as tolerated. Counseled on medication adherence and adverse effects. - Follow-up with primary provider as scheduled.  2. Hyperlipidemia, unspecified hyperlipidemia type - Continue Atorvastatin as prescribed. No refills needed as of present.  - Routine screening.  - Follow-up with primary provider as scheduled. - Lipid panel; Future  3. Sensation of plugged ear on both sides - Referral to ENT for evaluation/management. - Ambulatory referral to ENT  4. Snoring - Routine screening.  - PSG Sleep Study; Future    Patient was given the opportunity to ask questions.  Patient verbalized understanding of the plan and was able to repeat key elements of the plan. Patient was given clear instructions to go to Emergency Department or return to medical center if symptoms don't improve, worsen, or new problems develop.The patient verbalized understanding.   Orders Placed This Encounter  Procedures   Lipid panel   Ambulatory referral to ENT   PSG Sleep Study    Follow-up with primary provider as scheduled.   Rema Fendt, NP

## 2023-07-10 LAB — MICROALBUMIN / CREATININE URINE RATIO
Creatinine, Urine: 101.7 mg/dL
Microalb/Creat Ratio: 59 mg/g{creat} — ABNORMAL HIGH (ref 0–29)
Microalbumin, Urine: 60.5 ug/mL

## 2023-07-10 LAB — SPECIMEN STATUS REPORT

## 2023-07-13 ENCOUNTER — Other Ambulatory Visit: Payer: Self-pay | Admitting: Family

## 2023-07-13 ENCOUNTER — Encounter: Payer: Self-pay | Admitting: Family

## 2023-07-13 DIAGNOSIS — R0683 Snoring: Secondary | ICD-10-CM

## 2023-07-13 NOTE — Telephone Encounter (Signed)
No updates as of present from sleep study referral ordered 07/01/23. Today new referral for sleep study ordered and ambulatory referral to Sleep Studies ordered. Expect call soon with appointment details.

## 2023-07-16 ENCOUNTER — Encounter (INDEPENDENT_AMBULATORY_CARE_PROVIDER_SITE_OTHER): Payer: Self-pay | Admitting: Otolaryngology

## 2023-07-16 ENCOUNTER — Other Ambulatory Visit: Payer: Self-pay

## 2023-08-11 ENCOUNTER — Institutional Professional Consult (permissible substitution) (INDEPENDENT_AMBULATORY_CARE_PROVIDER_SITE_OTHER): Payer: BC Managed Care – PPO

## 2023-08-24 ENCOUNTER — Other Ambulatory Visit (HOSPITAL_COMMUNITY): Payer: Self-pay

## 2023-09-08 ENCOUNTER — Other Ambulatory Visit (HOSPITAL_COMMUNITY): Payer: Self-pay

## 2023-09-09 ENCOUNTER — Other Ambulatory Visit: Payer: Self-pay

## 2023-09-14 ENCOUNTER — Other Ambulatory Visit: Payer: Self-pay

## 2023-09-17 ENCOUNTER — Other Ambulatory Visit (HOSPITAL_BASED_OUTPATIENT_CLINIC_OR_DEPARTMENT_OTHER): Payer: Self-pay

## 2023-09-28 ENCOUNTER — Telehealth: Payer: Self-pay | Admitting: Family

## 2023-10-06 ENCOUNTER — Other Ambulatory Visit (HOSPITAL_COMMUNITY): Payer: Self-pay
